# Patient Record
Sex: Female | Born: 1935 | Race: White | Hispanic: No | Smoking: Never smoker
Health system: Southern US, Community
[De-identification: ages and names within clinical notes are randomized; demographics above are authoritative.]

## PROBLEM LIST (undated history)

## (undated) DIAGNOSIS — B019 Varicella without complication: Secondary | ICD-10-CM

## (undated) DIAGNOSIS — M810 Age-related osteoporosis without current pathological fracture: Secondary | ICD-10-CM

## (undated) DIAGNOSIS — M1990 Unspecified osteoarthritis, unspecified site: Secondary | ICD-10-CM

## (undated) DIAGNOSIS — R42 Dizziness and giddiness: Secondary | ICD-10-CM

## (undated) HISTORY — PX: WISDOM TOOTH EXTRACTION: SHX21

## (undated) HISTORY — DX: Varicella without complication: B01.9

## (undated) HISTORY — PX: ABDOMINAL HYSTERECTOMY: SHX81

---

## 2009-06-26 ENCOUNTER — Ambulatory Visit: Payer: Self-pay | Admitting: Internal Medicine

## 2011-01-16 ENCOUNTER — Ambulatory Visit: Payer: Self-pay | Admitting: Internal Medicine

## 2012-01-19 ENCOUNTER — Ambulatory Visit: Payer: Self-pay | Admitting: Internal Medicine

## 2012-02-23 ENCOUNTER — Emergency Department: Payer: Self-pay | Admitting: Emergency Medicine

## 2012-06-29 ENCOUNTER — Ambulatory Visit: Payer: Self-pay | Admitting: Internal Medicine

## 2013-01-19 ENCOUNTER — Ambulatory Visit: Payer: Self-pay | Admitting: Internal Medicine

## 2014-01-22 ENCOUNTER — Ambulatory Visit: Payer: Self-pay | Admitting: Internal Medicine

## 2015-07-10 ENCOUNTER — Other Ambulatory Visit: Payer: Self-pay | Admitting: Internal Medicine

## 2015-07-10 DIAGNOSIS — M810 Age-related osteoporosis without current pathological fracture: Secondary | ICD-10-CM

## 2015-07-24 ENCOUNTER — Ambulatory Visit
Admission: RE | Admit: 2015-07-24 | Discharge: 2015-07-24 | Disposition: A | Discharge status: Home / Self Care | Payer: Medicare HMO | Source: Ambulatory Visit | Attending: Internal Medicine | Admitting: Internal Medicine

## 2015-07-24 DIAGNOSIS — M85832 Other specified disorders of bone density and structure, left forearm: Secondary | ICD-10-CM | POA: Diagnosis not present

## 2015-07-24 DIAGNOSIS — M85851 Other specified disorders of bone density and structure, right thigh: Secondary | ICD-10-CM | POA: Insufficient documentation

## 2015-07-24 DIAGNOSIS — M8588 Other specified disorders of bone density and structure, other site: Secondary | ICD-10-CM | POA: Insufficient documentation

## 2015-07-24 DIAGNOSIS — M810 Age-related osteoporosis without current pathological fracture: Secondary | ICD-10-CM | POA: Diagnosis present

## 2016-06-12 ENCOUNTER — Encounter: Payer: Self-pay | Admitting: *Deleted

## 2016-06-12 NOTE — Discharge Instructions (Signed)
Cataract Surgery, Care After °Refer to this sheet in the next few weeks. These instructions provide you with information about caring for yourself after your procedure. Your health care provider may also give you more specific instructions. Your treatment has been planned according to current medical practices, but problems sometimes occur. Call your health care provider if you have any problems or questions after your procedure. °What can I expect after the procedure? °After the procedure, it is common to have: °· Itching. °· Discomfort. °· Fluid discharge. °· Sensitivity to light and to touch. °· Bruising. °Follow these instructions at home: °Eye Care  °· Check your eye every day for signs of infection. Watch for: °¨ Redness, swelling, or pain. °¨ Fluid, blood, or pus. °¨ Warmth. °¨ Bad smell. °Activity  °· Avoid strenuous activities, such as playing contact sports, for as long as told by your health care provider. °· Do not drive or operate heavy machinery until your health care provider approves. °· Do not bend or lift heavy objects . Bending increases pressure in the eye. You can walk, climb stairs, and do light household chores. °· Ask your health care provider when you can return to work. If you work in a dusty environment, you may be advised to wear protective eyewear for a period of time. °General instructions  °· Take or apply over-the-counter and prescription medicines only as told by your health care provider. This includes eye drops. °· Do not touch or rub your eyes. °· If you were given a protective shield, wear it as told by your health care provider. If you were not given a protective shield, wear sunglasses as told by your health care provider to protect your eyes. °· Keep the area around your eye clean and dry. Avoid swimming or allowing water to hit you directly in the face while showering until told by your health care provider. Keep soap and shampoo out of your eyes. °· Do not put a contact lens  into the affected eye or eyes until your health care provider approves. °· Keep all follow-up visits as told by your health care provider. This is important. °Contact a health care provider if: ° °· You have increased bruising around your eye. °· You have pain that is not helped with medicine. °· You have a fever. °· You have redness, swelling, or pain in your eye. °· You have fluid, blood, or pus coming from your incision. °· Your vision gets worse. °Get help right away if: °· You have sudden vision loss. °This information is not intended to replace advice given to you by your health care provider. Make sure you discuss any questions you have with your health care provider. °Document Released: 11/07/2004 Document Revised: 08/29/2015 Document Reviewed: 02/28/2015 °Elsevier Interactive Patient Education © 2017 [employer] ° ° ° ° °General Anesthesia, Adult, Care After °These instructions provide you with information about caring for yourself after your procedure. Your health care provider may also give you more specific instructions. Your treatment has been planned according to current medical practices, but problems sometimes occur. Call your health care provider if you have any problems or questions after your procedure. °What can I expect after the procedure? °After the procedure, it is common to have: °· Vomiting. °· A sore throat. °· Mental slowness. °It is common to feel: °· Nauseous. °· Cold or shivery. °· Sleepy. °· Tired. °· Sore or achy, even in parts of your body where you did not have surgery. °Follow these instructions at   home: °For at least 24 hours after the procedure:  °· Do not: °¨ Participate in activities where you could fall or become injured. °¨ Drive. °¨ Use heavy machinery. °¨ Drink alcohol. °¨ Take sleeping pills or medicines that cause drowsiness. °¨ Make important decisions or sign legal documents. °¨ Take care of children on your own. °· Rest. °Eating and drinking  °· If you vomit, drink  water, juice, or soup when you can drink without vomiting. °· Drink enough fluid to keep your urine clear or pale yellow. °· Make sure you have little or no nausea before eating solid foods. °· Follow the diet recommended by your health care provider. °General instructions  °· Have a responsible adult stay with you until you are awake and alert. °· Return to your normal activities as told by your health care provider. Ask your health care provider what activities are safe for you. °· Take over-the-counter and prescription medicines only as told by your health care provider. °· If you smoke, do not smoke without supervision. °· Keep all follow-up visits as told by your health care provider. This is important. °Contact a health care provider if: °· You continue to have nausea or vomiting at home, and medicines are not helpful. °· You cannot drink fluids or start eating again. °· You cannot urinate after 8-12 hours. °· You develop a skin rash. °· You have fever. °· You have increasing redness at the site of your procedure. °Get help right away if: °· You have difficulty breathing. °· You have chest pain. °· You have unexpected bleeding. °· You feel that you are having a life-threatening or urgent problem. °This information is not intended to replace advice given to you by your health care provider. Make sure you discuss any questions you have with your health care provider. °Document Released: 07/27/2000 Document Revised: 09/23/2015 Document Reviewed: 04/04/2015 °Elsevier Interactive Patient Education © 2017 [employer] ° °

## 2016-06-15 ENCOUNTER — Encounter: Payer: Self-pay | Admitting: Anesthesiology

## 2016-06-17 ENCOUNTER — Ambulatory Visit: Payer: Medicare PPO | Admitting: Anesthesiology

## 2016-06-17 ENCOUNTER — Encounter
Admission: RE | Disposition: A | Discharge status: Home / Self Care | Payer: Self-pay | Source: Ambulatory Visit | Attending: Ophthalmology

## 2016-06-17 ENCOUNTER — Ambulatory Visit
Admission: RE | Admit: 2016-06-17 | Discharge: 2016-06-17 | Disposition: A | Discharge status: Home / Self Care | Payer: Medicare PPO | Source: Ambulatory Visit | Attending: Ophthalmology | Admitting: Ophthalmology

## 2016-06-17 DIAGNOSIS — H2511 Age-related nuclear cataract, right eye: Secondary | ICD-10-CM | POA: Insufficient documentation

## 2016-06-17 DIAGNOSIS — M810 Age-related osteoporosis without current pathological fracture: Secondary | ICD-10-CM | POA: Diagnosis not present

## 2016-06-17 DIAGNOSIS — Z79899 Other long term (current) drug therapy: Secondary | ICD-10-CM | POA: Diagnosis not present

## 2016-06-17 DIAGNOSIS — Z7982 Long term (current) use of aspirin: Secondary | ICD-10-CM | POA: Insufficient documentation

## 2016-06-17 DIAGNOSIS — Z7983 Long term (current) use of bisphosphonates: Secondary | ICD-10-CM | POA: Diagnosis not present

## 2016-06-17 DIAGNOSIS — Z791 Long term (current) use of non-steroidal anti-inflammatories (NSAID): Secondary | ICD-10-CM | POA: Diagnosis not present

## 2016-06-17 HISTORY — DX: Dizziness and giddiness: R42

## 2016-06-17 HISTORY — PX: CATARACT EXTRACTION W/PHACO: SHX586

## 2016-06-17 HISTORY — DX: Age-related osteoporosis without current pathological fracture: M81.0

## 2016-06-17 HISTORY — DX: Unspecified osteoarthritis, unspecified site: M19.90

## 2016-06-17 SURGERY — PHACOEMULSIFICATION, CATARACT, WITH IOL INSERTION
Anesthesia: Monitor Anesthesia Care | Site: Eye | Laterality: Right | Wound class: Clean

## 2016-06-17 MED ORDER — ACETAMINOPHEN 325 MG PO TABS: 325.0000 mg | ORAL_TABLET | ORAL | Status: DC | PRN

## 2016-06-17 MED ORDER — ARMC OPHTHALMIC DILATING DROPS
1.0000 "application " | OPHTHALMIC | Status: DC | PRN
  Administered 2016-06-17 (×3): 1 via OPHTHALMIC

## 2016-06-17 MED ORDER — LIDOCAINE HCL (PF) 2 % IJ SOLN
INTRAOCULAR | Status: DC | PRN
  Administered 2016-06-17: 2 mL via INTRAOCULAR

## 2016-06-17 MED ORDER — LACTATED RINGERS IV SOLN: INTRAVENOUS | Status: DC

## 2016-06-17 MED ORDER — NA HYALUR & NA CHOND-NA HYALUR 0.4-0.35 ML IO KIT
PACK | INTRAOCULAR | Status: DC | PRN
  Administered 2016-06-17: 1 mL via INTRAOCULAR

## 2016-06-17 MED ORDER — LACTATED RINGERS IV SOLN: 500.0000 mL | INTRAVENOUS | Status: DC

## 2016-06-17 MED ORDER — MIDAZOLAM HCL 2 MG/2ML IJ SOLN
INTRAMUSCULAR | Status: DC | PRN
  Administered 2016-06-17: 1 mg via INTRAVENOUS

## 2016-06-17 MED ORDER — BRIMONIDINE TARTRATE-TIMOLOL 0.2-0.5 % OP SOLN
OPHTHALMIC | Status: DC | PRN
  Administered 2016-06-17: 1 [drp] via OPHTHALMIC

## 2016-06-17 MED ORDER — FENTANYL CITRATE (PF) 100 MCG/2ML IJ SOLN
INTRAMUSCULAR | Status: DC | PRN
Start: 2016-06-17 — End: 2016-06-17
  Administered 2016-06-17: 50 ug via INTRAVENOUS

## 2016-06-17 MED ORDER — MOXIFLOXACIN HCL 0.5 % OP SOLN
1.0000 [drp] | OPHTHALMIC | Status: DC | PRN
  Administered 2016-06-17 (×3): 1 [drp] via OPHTHALMIC

## 2016-06-17 MED ORDER — BSS IO SOLN
INTRAOCULAR | Status: DC | PRN
  Administered 2016-06-17: 63 mL via OPHTHALMIC

## 2016-06-17 MED ORDER — CEFUROXIME OPHTHALMIC INJECTION 1 MG/0.1 ML
INJECTION | OPHTHALMIC | Status: DC | PRN
  Administered 2016-06-17: .3 mL via OPHTHALMIC

## 2016-06-17 MED ORDER — ACETAMINOPHEN 160 MG/5ML PO SOLN: 325.0000 mg | ORAL | Status: DC | PRN

## 2016-06-17 SURGICAL SUPPLY — 25 items
CANNULA ANT/CHMB 27GA (MISCELLANEOUS) ×3 IMPLANT
CARTRIDGE ABBOTT (MISCELLANEOUS) IMPLANT
GLOVE SURG LX 7.5 STRW (GLOVE) ×2
GLOVE SURG LX STRL 7.5 STRW (GLOVE) ×1 IMPLANT
GLOVE SURG TRIUMPH 8.0 PF LTX (GLOVE) ×3 IMPLANT
GOWN STRL REUS W/ TWL LRG LVL3 (GOWN DISPOSABLE) ×2 IMPLANT
GOWN STRL REUS W/TWL LRG LVL3 (GOWN DISPOSABLE) ×4
LENS IOL TECNIS ITEC 24.0 (Intraocular Lens) ×3 IMPLANT
MARKER SKIN DUAL TIP RULER LAB (MISCELLANEOUS) ×3 IMPLANT
NDL RETROBULBAR .5 NSTRL (NEEDLE) IMPLANT
NEEDLE FILTER BLUNT 18X 1/2SAF (NEEDLE) ×2
NEEDLE FILTER BLUNT 18X1 1/2 (NEEDLE) ×1 IMPLANT
PACK CATARACT BRASINGTON (MISCELLANEOUS) ×3 IMPLANT
PACK EYE AFTER SURG (MISCELLANEOUS) ×3 IMPLANT
PACK OPTHALMIC (MISCELLANEOUS) ×3 IMPLANT
RING MALYGIN 7.0 (MISCELLANEOUS) IMPLANT
SUT ETHILON 10-0 CS-B-6CS-B-6 (SUTURE)
SUT VICRYL  9 0 (SUTURE)
SUT VICRYL 9 0 (SUTURE) IMPLANT
SUTURE EHLN 10-0 CS-B-6CS-B-6 (SUTURE) IMPLANT
SYR 3ML LL SCALE MARK (SYRINGE) ×3 IMPLANT
SYR 5ML LL (SYRINGE) ×3 IMPLANT
SYR TB 1ML LUER SLIP (SYRINGE) ×3 IMPLANT
WATER STERILE IRR 250ML POUR (IV SOLUTION) ×3 IMPLANT
WIPE NON LINTING 3.25X3.25 (MISCELLANEOUS) ×3 IMPLANT

## 2016-06-17 NOTE — Anesthesia Preprocedure Evaluation (Signed)
Anesthesia Evaluation  Patient identified by MRN, date of birth, ID band Patient awake    Reviewed: Allergy & Precautions, H&P , NPO status , Patient's Chart, lab work & pertinent test results, reviewed documented beta blocker date and time   Airway Mallampati: II  TM Distance: >3 FB Neck ROM: full    Dental no notable dental hx.    Pulmonary neg pulmonary ROS,    Pulmonary exam normal breath sounds clear to auscultation       Cardiovascular Exercise Tolerance: Good negative cardio ROS   Rhythm:regular Rate:Normal     Neuro/Psych negative neurological ROS  negative psych ROS   GI/Hepatic negative GI ROS, Neg liver ROS,   Endo/Other  negative endocrine ROS  Renal/GU negative Renal ROS  negative genitourinary   Musculoskeletal   Abdominal   Peds  Hematology negative hematology ROS (+)   Anesthesia Other Findings   Reproductive/Obstetrics negative OB ROS                             Anesthesia Physical Anesthesia Plan  ASA: II  Anesthesia Plan: MAC   Post-op Pain Management:    Induction:   Airway Management Planned:   Additional Equipment:   Intra-op Plan:   Post-operative Plan:   Informed Consent:   Plan Discussed with: CRNA  Anesthesia Plan Comments:         Anesthesia Quick Evaluation

## 2016-06-17 NOTE — Op Note (Signed)
LOCATION:  [facility]   PREOPERATIVE DIAGNOSIS:    Nuclear sclerotic cataract right eye. H25.11   POSTOPERATIVE DIAGNOSIS:  Nuclear sclerotic cataract right eye.     PROCEDURE:  Phacoemusification with posterior chamber intraocular lens placement of the right eye   LENS:   Implant Name Type Inv. Item Serial No. Manufacturer Lot No. LRB No. Used  LENS IOL DIOP 24.0 - [id number] Intraocular Lens LENS IOL DIOP 24.0 [id number]   Right 1        ULTRASOUND TIME: 16 % of 1 minutes, 6 seconds.  CDE 10.8   SURGEON:  Deirdre Evenerhadwick R. Marvel Mcphillips, MD   ANESTHESIA:  Topical with tetracaine drops and 2% Xylocaine jelly, augmented with 1% preservative-free intracameral lidocaine.    COMPLICATIONS:  None.   DESCRIPTION OF PROCEDURE:  The patient was identified in the holding room and transported to the operating room and placed in the supine position under the operating microscope.  The right eye was identified as the operative eye and it was prepped and draped in the usual sterile ophthalmic fashion.   A 1 millimeter clear-corneal paracentesis was made at the 12:00 position.  0.5 ml of preservative-free 1% lidocaine was injected into the anterior chamber. The anterior chamber was filled with Viscoat viscoelastic.  A 2.4 millimeter keratome was used to make a near-clear corneal incision at the 9:00 position.  A curvilinear capsulorrhexis was made with a cystotome and capsulorrhexis forceps.  Balanced salt solution was used to hydrodissect and hydrodelineate the nucleus.   Phacoemulsification was then used in stop and chop fashion to remove the lens nucleus and epinucleus.  The remaining cortex was then removed using the irrigation and aspiration handpiece. Provisc was then placed into the capsular bag to distend it for lens placement.  A lens was then injected into the capsular bag.  The remaining viscoelastic was aspirated.   Wounds were hydrated with balanced salt solution.  The anterior  chamber was inflated to a physiologic pressure with balanced salt solution.  No wound leaks were noted. Cefuroxime 0.1 ml of a 10mg /ml solution was injected into the anterior chamber for a dose of 1 mg of intracameral antibiotic at the completion of the case.   Timolol and Brimonidine drops were applied to the eye.  The patient was taken to the recovery room in stable condition without complications of anesthesia or surgery.   Rosalene Wardrop 06/17/2016, 11:21 AM

## 2016-06-17 NOTE — H&P (Signed)
The History and Physical notes are on paper, have been signed, and are to be scanned. The patient remains stable and unchanged from the H&P.   Previous H&P reviewed, patient examined, and there are no changes.  Dawn Clark 06/17/2016 9:58 AM

## 2016-06-17 NOTE — Anesthesia Procedure Notes (Signed)
Procedure Name: MAC Performed by: Omarion Minnehan Pre-anesthesia Checklist: Patient identified, Emergency Drugs available, Suction available, Timeout performed and Patient being monitored Patient Re-evaluated:Patient Re-evaluated prior to inductionOxygen Delivery Method: Nasal cannula Placement Confirmation: positive ETCO2     

## 2016-06-17 NOTE — Transfer of Care (Signed)
Immediate Anesthesia Transfer of Care Note  Patient: Dawn Clark  Procedure(s) Performed: Procedure(s) with comments: CATARACT EXTRACTION PHACO AND INTRAOCULAR LENS PLACEMENT (IOC)  Right Complicated (Right) - possible malyugin right eye  Patient Location: PACU  Anesthesia Type: MAC  Level of Consciousness: awake, alert  and patient cooperative  Airway and Oxygen Therapy: Patient Spontanous Breathing and Patient connected to supplemental oxygen  Post-op Assessment: Post-op Vital signs reviewed, Patient's Cardiovascular Status Stable, Respiratory Function Stable, Patent Airway and No signs of Nausea or vomiting  Post-op Vital Signs: Reviewed and stable  Complications: No apparent anesthesia complications

## 2016-06-17 NOTE — Anesthesia Postprocedure Evaluation (Signed)
Anesthesia Post Note  Patient: Dawn Clark  Procedure(s) Performed: Procedure(s) (LRB): CATARACT EXTRACTION PHACO AND INTRAOCULAR LENS PLACEMENT (IOC)  Right Complicated (Right)  Patient location during evaluation: PACU Anesthesia Type: MAC Level of consciousness: awake and alert Pain management: pain level controlled Vital Signs Assessment: post-procedure vital signs reviewed and stable Respiratory status: spontaneous breathing, nonlabored ventilation and respiratory function stable Cardiovascular status: stable and blood pressure returned to baseline Anesthetic complications: no    [name]

## 2016-06-18 ENCOUNTER — Encounter: Payer: Self-pay | Admitting: Ophthalmology

## 2016-07-10 ENCOUNTER — Encounter: Payer: Self-pay | Admitting: *Deleted

## 2016-07-10 NOTE — Discharge Instructions (Signed)
Cataract Surgery, Care After °Refer to this sheet in the next few weeks. These instructions provide you with information about caring for yourself after your procedure. Your health care provider may also give you more specific instructions. Your treatment has been planned according to current medical practices, but problems sometimes occur. Call your health care provider if you have any problems or questions after your procedure. °What can I expect after the procedure? °After the procedure, it is common to have: °· Itching. °· Discomfort. °· Fluid discharge. °· Sensitivity to light and to touch. °· Bruising. °Follow these instructions at home: °Eye Care  °· Check your eye every day for signs of infection. Watch for: °¨ Redness, swelling, or pain. °¨ Fluid, blood, or pus. °¨ Warmth. °¨ Bad smell. °Activity  °· Avoid strenuous activities, such as playing contact sports, for as long as told by your health care provider. °· Do not drive or operate heavy machinery until your health care provider approves. °· Do not bend or lift heavy objects . Bending increases pressure in the eye. You can walk, climb stairs, and do light household chores. °· Ask your health care provider when you can return to work. If you work in a dusty environment, you may be advised to wear protective eyewear for a period of time. °General instructions  °· Take or apply over-the-counter and prescription medicines only as told by your health care provider. This includes eye drops. °· Do not touch or rub your eyes. °· If you were given a protective shield, wear it as told by your health care provider. If you were not given a protective shield, wear sunglasses as told by your health care provider to protect your eyes. °· Keep the area around your eye clean and dry. Avoid swimming or allowing water to hit you directly in the face while showering until told by your health care provider. Keep soap and shampoo out of your eyes. °· Do not put a contact lens  into the affected eye or eyes until your health care provider approves. °· Keep all follow-up visits as told by your health care provider. This is important. °Contact a health care provider if: ° °· You have increased bruising around your eye. °· You have pain that is not helped with medicine. °· You have a fever. °· You have redness, swelling, or pain in your eye. °· You have fluid, blood, or pus coming from your incision. °· Your vision gets worse. °Get help right away if: °· You have sudden vision loss. °This information is not intended to replace advice given to you by your health care provider. Make sure you discuss any questions you have with your health care provider. °Document Released: 11/07/2004 Document Revised: 08/29/2015 Document Reviewed: 02/28/2015 °Elsevier Interactive Patient Education © 2017 [employer] ° ° ° ° °General Anesthesia, Adult, Care After °These instructions provide you with information about caring for yourself after your procedure. Your health care provider may also give you more specific instructions. Your treatment has been planned according to current medical practices, but problems sometimes occur. Call your health care provider if you have any problems or questions after your procedure. °What can I expect after the procedure? °After the procedure, it is common to have: °· Vomiting. °· A sore throat. °· Mental slowness. °It is common to feel: °· Nauseous. °· Cold or shivery. °· Sleepy. °· Tired. °· Sore or achy, even in parts of your body where you did not have surgery. °Follow these instructions at   home: °For at least 24 hours after the procedure:  °· Do not: °¨ Participate in activities where you could fall or become injured. °¨ Drive. °¨ Use heavy machinery. °¨ Drink alcohol. °¨ Take sleeping pills or medicines that cause drowsiness. °¨ Make important decisions or sign legal documents. °¨ Take care of children on your own. °· Rest. °Eating and drinking  °· If you vomit, drink  water, juice, or soup when you can drink without vomiting. °· Drink enough fluid to keep your urine clear or pale yellow. °· Make sure you have little or no nausea before eating solid foods. °· Follow the diet recommended by your health care provider. °General instructions  °· Have a responsible adult stay with you until you are awake and alert. °· Return to your normal activities as told by your health care provider. Ask your health care provider what activities are safe for you. °· Take over-the-counter and prescription medicines only as told by your health care provider. °· If you smoke, do not smoke without supervision. °· Keep all follow-up visits as told by your health care provider. This is important. °Contact a health care provider if: °· You continue to have nausea or vomiting at home, and medicines are not helpful. °· You cannot drink fluids or start eating again. °· You cannot urinate after 8-12 hours. °· You develop a skin rash. °· You have fever. °· You have increasing redness at the site of your procedure. °Get help right away if: °· You have difficulty breathing. °· You have chest pain. °· You have unexpected bleeding. °· You feel that you are having a life-threatening or urgent problem. °This information is not intended to replace advice given to you by your health care provider. Make sure you discuss any questions you have with your health care provider. °Document Released: 07/27/2000 Document Revised: 09/23/2015 Document Reviewed: 04/04/2015 °Elsevier Interactive Patient Education © 2017 [employer] ° °

## 2016-07-15 ENCOUNTER — Ambulatory Visit: Payer: Medicare PPO | Admitting: Anesthesiology

## 2016-07-15 ENCOUNTER — Ambulatory Visit
Admission: RE | Admit: 2016-07-15 | Discharge: 2016-07-15 | Disposition: A | Discharge status: Home / Self Care | Payer: Medicare PPO | Source: Ambulatory Visit | Attending: Ophthalmology | Admitting: Ophthalmology

## 2016-07-15 ENCOUNTER — Encounter
Admission: RE | Disposition: A | Discharge status: Home / Self Care | Payer: Self-pay | Source: Ambulatory Visit | Attending: Ophthalmology

## 2016-07-15 DIAGNOSIS — H2512 Age-related nuclear cataract, left eye: Secondary | ICD-10-CM | POA: Diagnosis not present

## 2016-07-15 HISTORY — PX: CATARACT EXTRACTION W/PHACO: SHX586

## 2016-07-15 SURGERY — PHACOEMULSIFICATION, CATARACT, WITH IOL INSERTION
Anesthesia: Monitor Anesthesia Care | Site: Eye | Laterality: Left | Wound class: Clean

## 2016-07-15 MED ORDER — NA HYALUR & NA CHOND-NA HYALUR 0.4-0.35 ML IO KIT
PACK | INTRAOCULAR | Status: DC | PRN
  Administered 2016-07-15: 1 mL via INTRAOCULAR

## 2016-07-15 MED ORDER — ACETAMINOPHEN 325 MG PO TABS: 325.0000 mg | ORAL_TABLET | ORAL | Status: DC | PRN

## 2016-07-15 MED ORDER — MOXIFLOXACIN HCL 0.5 % OP SOLN
1.0000 [drp] | OPHTHALMIC | Status: DC | PRN
  Administered 2016-07-15 (×3): 1 [drp] via OPHTHALMIC

## 2016-07-15 MED ORDER — BRIMONIDINE TARTRATE-TIMOLOL 0.2-0.5 % OP SOLN
OPHTHALMIC | Status: DC | PRN
  Administered 2016-07-15: 1 [drp] via OPHTHALMIC

## 2016-07-15 MED ORDER — ARMC OPHTHALMIC DILATING DROPS
1.0000 "application " | OPHTHALMIC | Status: DC | PRN
  Administered 2016-07-15 (×3): 1 via OPHTHALMIC

## 2016-07-15 MED ORDER — LACTATED RINGERS IV SOLN: INTRAVENOUS | Status: DC

## 2016-07-15 MED ORDER — CEFUROXIME OPHTHALMIC INJECTION 1 MG/0.1 ML
INJECTION | OPHTHALMIC | Status: DC | PRN
  Administered 2016-07-15: .3 mL via OPHTHALMIC

## 2016-07-15 MED ORDER — MIDAZOLAM HCL 2 MG/2ML IJ SOLN
INTRAMUSCULAR | Status: DC | PRN
  Administered 2016-07-15: 1.5 mg via INTRAVENOUS

## 2016-07-15 MED ORDER — ACETAMINOPHEN 160 MG/5ML PO SOLN: 325.0000 mg | ORAL | Status: DC | PRN

## 2016-07-15 MED ORDER — EPINEPHRINE PF 1 MG/ML IJ SOLN
INTRAOCULAR | Status: DC | PRN
  Administered 2016-07-15: 69 mL via OPHTHALMIC

## 2016-07-15 MED ORDER — LIDOCAINE HCL (PF) 2 % IJ SOLN
INTRAOCULAR | Status: DC | PRN
  Administered 2016-07-15: 1 mL via INTRAOCULAR

## 2016-07-15 MED ORDER — FENTANYL CITRATE (PF) 100 MCG/2ML IJ SOLN
INTRAMUSCULAR | Status: DC | PRN
  Administered 2016-07-15: 50 ug via INTRAVENOUS

## 2016-07-15 SURGICAL SUPPLY — 25 items
CANNULA ANT/CHMB 27GA (MISCELLANEOUS) ×2 IMPLANT
CARTRIDGE ABBOTT (MISCELLANEOUS) IMPLANT
GLOVE SURG LX 7.5 STRW (GLOVE) ×1
GLOVE SURG LX STRL 7.5 STRW (GLOVE) ×1 IMPLANT
GLOVE SURG TRIUMPH 8.0 PF LTX (GLOVE) ×2 IMPLANT
GOWN STRL REUS W/ TWL LRG LVL3 (GOWN DISPOSABLE) ×2 IMPLANT
GOWN STRL REUS W/TWL LRG LVL3 (GOWN DISPOSABLE) ×2
LENS IOL TECNIS ITEC 23.5 (Intraocular Lens) ×2 IMPLANT
MARKER SKIN DUAL TIP RULER LAB (MISCELLANEOUS) ×2 IMPLANT
NDL RETROBULBAR .5 NSTRL (NEEDLE) IMPLANT
NEEDLE FILTER BLUNT 18X 1/2SAF (NEEDLE) ×1
NEEDLE FILTER BLUNT 18X1 1/2 (NEEDLE) ×1 IMPLANT
PACK CATARACT BRASINGTON (MISCELLANEOUS) ×2 IMPLANT
PACK EYE AFTER SURG (MISCELLANEOUS) ×2 IMPLANT
PACK OPTHALMIC (MISCELLANEOUS) ×2 IMPLANT
RING MALYGIN 7.0 (MISCELLANEOUS) IMPLANT
SUT ETHILON 10-0 CS-B-6CS-B-6 (SUTURE)
SUT VICRYL  9 0 (SUTURE)
SUT VICRYL 9 0 (SUTURE) IMPLANT
SUTURE EHLN 10-0 CS-B-6CS-B-6 (SUTURE) IMPLANT
SYR 3ML LL SCALE MARK (SYRINGE) ×2 IMPLANT
SYR 5ML LL (SYRINGE) ×2 IMPLANT
SYR TB 1ML LUER SLIP (SYRINGE) ×2 IMPLANT
WATER STERILE IRR 250ML POUR (IV SOLUTION) ×2 IMPLANT
WIPE NON LINTING 3.25X3.25 (MISCELLANEOUS) ×2 IMPLANT

## 2016-07-15 NOTE — H&P (Signed)
The History and Physical notes are on paper, have been signed, and are to be scanned. The patient remains stable and unchanged from the H&P.   Previous H&P reviewed, patient examined, and there are no changes.  Dawn Clark 07/15/2016 9:42 AM   

## 2016-07-15 NOTE — Op Note (Signed)
OPERATIVE NOTE  Dawn PeoplesRuby Lea Clark [id number] 07/15/2016   PREOPERATIVE DIAGNOSIS:  Nuclear sclerotic cataract left eye. H25.12   POSTOPERATIVE DIAGNOSIS:    Nuclear sclerotic cataract left eye.     PROCEDURE:  Phacoemusification with posterior chamber intraocular lens placement of the left eye   LENS:   Implant Name Type Inv. Item Serial No. Manufacturer Lot No. LRB No. Used  LENS IOL DIOP 23.5 - [id number] Intraocular Lens LENS IOL DIOP 23.5 [id number]   Left 1        ULTRASOUND TIME: 15  % of 1 minutes 4 seconds, CDE 9.4  SURGEON:  Deirdre Evenerhadwick R. Caly Pellum, MD   ANESTHESIA:  Topical with tetracaine drops and 2% Xylocaine jelly, augmented with 1% preservative-free intracameral lidocaine.    COMPLICATIONS:  None.   DESCRIPTION OF PROCEDURE:  The patient was identified in the holding room and transported to the operating room and placed in the supine position under the operating microscope.  The left eye was identified as the operative eye and it was prepped and draped in the usual sterile ophthalmic fashion.   A 1 millimeter clear-corneal paracentesis was made at the 1:30 position.  0.5 ml of preservative-free 1% lidocaine was injected into the anterior chamber.  The anterior chamber was filled with Viscoat viscoelastic.  A 2.4 millimeter keratome was used to make a near-clear corneal incision at the 10:30 position.  .  A curvilinear capsulorrhexis was made with a cystotome and capsulorrhexis forceps.  Balanced salt solution was used to hydrodissect and hydrodelineate the nucleus.   Phacoemulsification was then used in stop and chop fashion to remove the lens nucleus and epinucleus.  The remaining cortex was then removed using the irrigation and aspiration handpiece. Provisc was then placed into the capsular bag to distend it for lens placement.  A lens was then injected into the capsular bag.  The remaining viscoelastic was aspirated.   Wounds were hydrated with balanced salt  solution.  The anterior chamber was inflated to a physiologic pressure with balanced salt solution.  No wound leaks were noted. Cefuroxime 0.1 ml of a 10mg /ml solution was injected into the anterior chamber for a dose of 1 mg of intracameral antibiotic at the completion of the case.   Timolol and Brimonidine drops were applied to the eye.  The patient was taken to the recovery room in stable condition without complications of anesthesia or surgery.  [name] 07/15/2016, 10:09 AM

## 2016-07-15 NOTE — Transfer of Care (Signed)
Immediate Anesthesia Transfer of Care Note  Patient: Dawn Clark  Procedure(s) Performed: Procedure(s) with comments: CATARACT EXTRACTION PHACO AND INTRAOCULAR LENS PLACEMENT (IOC) (Left) - left  Patient Location: PACU  Anesthesia Type: MAC  Level of Consciousness: awake, alert  and patient cooperative  Airway and Oxygen Therapy: Patient Spontanous Breathing and Patient connected to supplemental oxygen  Post-op Assessment: Post-op Vital signs reviewed, Patient's Cardiovascular Status Stable, Respiratory Function Stable, Patent Airway and No signs of Nausea or vomiting  Post-op Vital Signs: Reviewed and stable  Complications: No apparent anesthesia complications

## 2016-07-15 NOTE — Anesthesia Procedure Notes (Signed)
Procedure Name: MAC Performed by: Mayme Genta Pre-anesthesia Checklist: Patient identified, Emergency Drugs available, Suction available, Timeout performed and Patient being monitored Patient Re-evaluated:Patient Re-evaluated prior to inductionOxygen Delivery Method: Nasal cannula Placement Confirmation: positive ETCO2

## 2016-07-15 NOTE — Anesthesia Preprocedure Evaluation (Signed)
Anesthesia Evaluation  Patient identified by MRN, date of birth, ID band Patient awake    Reviewed: Allergy & Precautions, H&P , NPO status , Patient's Chart, lab work & pertinent test results, reviewed documented beta blocker date and time   Airway Mallampati: II  TM Distance: >3 FB Neck ROM: full    Dental no notable dental hx.    Pulmonary neg pulmonary ROS,    Pulmonary exam normal breath sounds clear to auscultation       Cardiovascular Exercise Tolerance: Good negative cardio ROS   Rhythm:regular Rate:Normal     Neuro/Psych negative neurological ROS  negative psych ROS   GI/Hepatic negative GI ROS, Neg liver ROS,   Endo/Other  negative endocrine ROS  Renal/GU negative Renal ROS  negative genitourinary   Musculoskeletal   Abdominal   Peds  Hematology negative hematology ROS (+)   Anesthesia Other Findings   Reproductive/Obstetrics negative OB ROS                             Anesthesia Physical Anesthesia Plan  ASA: II  Anesthesia Plan: MAC   Post-op Pain Management:    Induction:   Airway Management Planned:   Additional Equipment:   Intra-op Plan:   Post-operative Plan:   Informed Consent:   Plan Discussed with: CRNA  Anesthesia Plan Comments:         Anesthesia Quick Evaluation  

## 2016-07-15 NOTE — Anesthesia Postprocedure Evaluation (Signed)
Anesthesia Post Note  Patient: Dawn Clark  Procedure(s) Performed: Procedure(s) (LRB): CATARACT EXTRACTION PHACO AND INTRAOCULAR LENS PLACEMENT (IOC) (Left)  Patient location during evaluation: PACU Anesthesia Type: MAC Level of consciousness: awake and alert and oriented Pain management: satisfactory to patient Vital Signs Assessment: post-procedure vital signs reviewed and stable Respiratory status: spontaneous breathing, nonlabored ventilation and respiratory function stable Cardiovascular status: blood pressure returned to baseline and stable Postop Assessment: Adequate PO intake and No signs of nausea or vomiting Anesthetic complications: no    Raliegh Ip

## 2016-07-16 ENCOUNTER — Encounter: Payer: Self-pay | Admitting: Ophthalmology

## 2016-10-11 ENCOUNTER — Emergency Department: Payer: Medicare PPO

## 2016-10-11 ENCOUNTER — Emergency Department
Admission: EM | Admit: 2016-10-11 | Discharge: 2016-10-11 | Disposition: A | Discharge status: Home / Self Care | Payer: Medicare PPO | Attending: Emergency Medicine | Admitting: Emergency Medicine

## 2016-10-11 DIAGNOSIS — Y998 Other external cause status: Secondary | ICD-10-CM | POA: Diagnosis not present

## 2016-10-11 DIAGNOSIS — Z7982 Long term (current) use of aspirin: Secondary | ICD-10-CM | POA: Diagnosis not present

## 2016-10-11 DIAGNOSIS — Y9389 Activity, other specified: Secondary | ICD-10-CM | POA: Insufficient documentation

## 2016-10-11 DIAGNOSIS — Y9289 Other specified places as the place of occurrence of the external cause: Secondary | ICD-10-CM | POA: Diagnosis not present

## 2016-10-11 DIAGNOSIS — S0990XA Unspecified injury of head, initial encounter: Secondary | ICD-10-CM | POA: Diagnosis present

## 2016-10-11 DIAGNOSIS — W19XXXA Unspecified fall, initial encounter: Secondary | ICD-10-CM

## 2016-10-11 DIAGNOSIS — Z79899 Other long term (current) drug therapy: Secondary | ICD-10-CM | POA: Insufficient documentation

## 2016-10-11 DIAGNOSIS — W108XXA Fall (on) (from) other stairs and steps, initial encounter: Secondary | ICD-10-CM | POA: Insufficient documentation

## 2016-10-11 DIAGNOSIS — S0101XA Laceration without foreign body of scalp, initial encounter: Secondary | ICD-10-CM | POA: Insufficient documentation

## 2016-10-11 MED ORDER — TETANUS-DIPHTH-ACELL PERTUSSIS 5-2.5-18.5 LF-MCG/0.5 IM SUSP
0.5000 mL | Freq: Once | INTRAMUSCULAR | Status: AC
  Administered 2016-10-11: 0.5 mL via INTRAMUSCULAR
  Filled 2016-10-11: qty 0.5

## 2016-10-11 NOTE — ED Provider Notes (Signed)
[facility] Emergency Department Provider Note  ____________________________________________   First MD Initiated Contact with Patient 10/11/16 1356     (approximate)  I have reviewed the triage vital signs and the nursing notes.   HISTORY  Chief Complaint Fall    HPI Dawn Clark is a 83 y.o. female who comes to the emergency department via EMS after mechanical trip and fall while at church. She was walking down the church steps when she slipped and fell and struck her head. She did not lose consciousness. She has no neck pain. She has no numbness weakness. She denies chest pain shortness of breath abdominal pain nausea or vomiting. She denies palpitations. She takes a baby aspirin every day but no other anticoagulants.   Past Medical History:  Diagnosis Date  . Arthritis   . Osteoporosis   . Vertigo    x1 over 2 yrs    There are no active problems to display for this patient.   Past Surgical History:  Procedure Laterality Date  . ABDOMINAL HYSTERECTOMY    . CATARACT EXTRACTION W/PHACO Right 06/17/2016   Procedure: CATARACT EXTRACTION PHACO AND INTRAOCULAR LENS PLACEMENT (IOC)  Right Complicated;  Surgeon: Lockie Molahadwick Brasington, MD;  [facility];  Service: Ophthalmology;  Laterality: Right;  possible malyugin right eye  . CATARACT EXTRACTION W/PHACO Left 07/15/2016   Procedure: CATARACT EXTRACTION PHACO AND INTRAOCULAR LENS PLACEMENT (IOC);  Surgeon: Lockie Molahadwick Brasington, MD;  [facility];  Service: Ophthalmology;  Laterality: Left;  left    Prior to Admission medications   Medication Sig Start Date End Date Taking? Authorizing Provider  alendronate (FOSAMAX) 70 MG tablet Take 70 mg by mouth once a week. Take with a full glass of water on an empty stomach.    [provider]  ASPIRIN 81 PO Take by mouth daily.    [provider]  calcium carbonate (OSCAL) 1500 (600 Ca) MG TABS tablet Take by  mouth daily.    [provider]  meclizine (ANTIVERT) 25 MG tablet Take 25 mg by mouth daily as needed for dizziness.    [provider]  multivitamin-iron-minerals-folic acid (CENTRUM) chewable tablet Chew 1 tablet by mouth daily.    [provider]  naproxen sodium (ANAPROX) 220 MG tablet Take 220 mg by mouth 2 (two) times daily with a meal.    [provider]    Allergies Patient has no known allergies.  History reviewed. No pertinent family history.  Social History Social History  Substance Use Topics  . Smoking status: Never Smoker  . Smokeless tobacco: Never Used  . Alcohol use No    Review of Systems Constitutional: No fever/chills Eyes: No visual changes. ENT: No sore throat. Cardiovascular: Denies chest pain. Respiratory: Denies shortness of breath. Gastrointestinal: No abdominal pain.  No nausea, no vomiting.  No diarrhea.  No constipation. Genitourinary: Negative for dysuria. Musculoskeletal: Negative for back pain. Skin: Positive for wound Neurological: Positive for headache   ____________________________________________   PHYSICAL EXAM:  VITAL SIGNS: ED Triage Vitals [10/11/16 1354]  Enc Vitals Group     BP (!) 154/97     Pulse Rate 84     Resp 18     Temp 98 F (36.7 C)     Temp Source Oral     SpO2 97 %     Weight 145 lb (65.8 kg)     Height 5\' 2"  (1.575 m)     Head Circumference  Peak Flow      Pain Score      Pain Loc      Pain Edu?      Excl. in GC?     Constitutional: Alert and oriented x 4 well appearing nontoxic no diaphoresis speaks in full, clear sentences Eyes: PERRL EOMI. Head: 2 small lacerations just above her left brow one 1 cm in length one 2 cm in length Nose: No congestion/rhinnorhea. Mouth/Throat: No trismus Neck: No stridor.  No midline tenderness Cardiovascular: Normal rate, regular rhythm. Grossly normal heart sounds.  Good peripheral circulation. Respiratory: Normal respiratory  effort.  No retractions. Lungs CTAB and moving good air Gastrointestinal: Soft nontender Musculoskeletal: No lower extremity edema   Neurologic:  Normal speech and language. No gross focal neurologic deficits are appreciated. Skin:  Skin is warm, dry and intact. No rash noted. Psychiatric: Mood and affect are normal. Speech and behavior are normal.    ____________________________________________   DIFFERENTIAL   ____________________________________________   LABS (all labs ordered are listed, but only abnormal results are displayed)  Labs Reviewed - No data to display   __________________________________________  EKG   ____________________________________________  RADIOLOGY  Head CT with no acute disease ____________________________________________   PROCEDURES  Procedure(s) performed: yes  LACERATION REPAIR Performed by: Merrily Brittle Authorized by: Merrily Brittle Consent: Verbal consent obtained. Risks and benefits: risks, benefits and alternatives were discussed Consent given by: patient Patient identity confirmed: provided demographic data Prepped and Draped in normal sterile fashion Wound explored  Laceration Location: 2 different lacerations one was 1 cm above the left brow the other was 2 cm above the left brow  Laceration Length: 1 cm and 2 cm   No Foreign Bodies seen or palpated    Irrigation method: syringe Amount of cleaning: standard  Skin closure: Dermabond     Technique: Dermabond with high flow nasal cannula to drive   Patient tolerance: Patient tolerated the procedure well with no immediate complications.    Procedures  Critical Care performed: no  Observation: no ____________________________________________   INITIAL IMPRESSION / ASSESSMENT AND PLAN / ED COURSE  Pertinent labs & imaging results that were available during my care of the patient were reviewed by me and considered in my medical decision making (see chart  for details).  The patient arrives neurovascularly intact and very well-appearing. She is NEXUS negative.  She is not on blood thinning medication aside from a daily baby aspirin. Given the significant trauma think is reasonable to obtain a head CT and reevaluate.  Fortunately the patient's head CT is negative for acute pathology. I irrigated her wounds with copious amounts of normal saline and then closed with Dermabond with good cosmesis. Strict return precautions given and she is discharged home in improved condition.     ____________________________________________   FINAL CLINICAL IMPRESSION(S) / ED DIAGNOSES  Final diagnoses:  Fall, initial encounter  Minor head injury, initial encounter  Laceration of scalp, initial encounter      NEW MEDICATIONS STARTED DURING THIS VISIT:  Discharge Medication List as of 10/11/2016  3:11 PM       Note:  This document was prepared using Dragon voice recognition software and may include unintentional dictation errors.     Merrily Brittle, MD 10/12/16 [phone]

## 2016-10-11 NOTE — ED Triage Notes (Signed)
Pt was at church and fell down the last step and hit her head on the pavement.  Pt denies LOC.  Pt states 0/10 pain.

## 2016-10-11 NOTE — Discharge Instructions (Signed)
Please do not wash your face for 24 hours and after 24 hours be very cautious and do not scrub your face. Follow-up with your doctor in 2 days for recheck as needed. Return to the emergency department for any concerns.  It was a pleasure to take care of you today, and thank you for coming to our emergency department.  If you have any questions or concerns before leaving please ask the nurse to grab me and I'm more than happy to go through your aftercare instructions again.  If you were prescribed any opioid pain medication today such as Norco, Vicodin, Percocet, morphine, hydrocodone, or oxycodone please make sure you do not drive when you are taking this medication as it can alter your ability to drive safely.  If you have any concerns once you are home that you are not improving or are in fact getting worse before you can make it to your follow-up appointment, please do not hesitate to call [phone] and come back for further evaluation.  Merrily BrittleNeil Kendale Rembold MD  No results found for this or any previous visit. Ct Head Wo Contrast  Result Date: 10/11/2016 CLINICAL DATA:  Fall.  Head injury. EXAM: CT HEAD WITHOUT CONTRAST TECHNIQUE: Contiguous axial images were obtained from the base of the skull through the vertex without intravenous contrast. COMPARISON:  None. FINDINGS: Brain: No evidence of acute infarction, hemorrhage, hydrocephalus, extra-axial collection or mass lesion/mass effect. Vascular: No hyperdense vessel or unexpected calcification. Skull: Normal. Negative for fracture or focal lesion. Sinuses/Orbits: No acute finding. Other: None. IMPRESSION: No acute intracranial pathology. Electronically Signed   By: Jolaine ClickArthur  Hoss M.D.   On: 10/11/2016 14:18

## 2016-11-30 ENCOUNTER — Ambulatory Visit
Admission: RE | Admit: 2016-11-30 | Discharge: 2016-11-30 | Disposition: A | Discharge status: Home / Self Care | Payer: Medicare PPO | Source: Ambulatory Visit | Attending: Internal Medicine | Admitting: Internal Medicine

## 2016-11-30 ENCOUNTER — Ambulatory Visit: Admission: RE | Admit: 2016-11-30 | Payer: Medicare PPO | Source: Ambulatory Visit | Admitting: *Deleted

## 2016-11-30 ENCOUNTER — Other Ambulatory Visit: Payer: Self-pay | Admitting: Internal Medicine

## 2016-11-30 DIAGNOSIS — W19XXXA Unspecified fall, initial encounter: Secondary | ICD-10-CM

## 2016-11-30 DIAGNOSIS — M25562 Pain in left knee: Secondary | ICD-10-CM | POA: Insufficient documentation

## 2016-11-30 DIAGNOSIS — M1712 Unilateral primary osteoarthritis, left knee: Secondary | ICD-10-CM | POA: Insufficient documentation

## 2017-03-14 DIAGNOSIS — M1712 Unilateral primary osteoarthritis, left knee: Secondary | ICD-10-CM | POA: Insufficient documentation

## 2017-05-26 ENCOUNTER — Encounter
Admission: RE | Admit: 2017-05-26 | Discharge: 2017-05-26 | Disposition: A | Discharge status: Home / Self Care | Payer: Medicare PPO | Source: Ambulatory Visit | Attending: Orthopedic Surgery | Admitting: Orthopedic Surgery

## 2017-05-26 ENCOUNTER — Other Ambulatory Visit: Payer: Self-pay

## 2017-05-26 DIAGNOSIS — Z01812 Encounter for preprocedural laboratory examination: Secondary | ICD-10-CM | POA: Diagnosis not present

## 2017-05-26 DIAGNOSIS — M1990 Unspecified osteoarthritis, unspecified site: Secondary | ICD-10-CM | POA: Insufficient documentation

## 2017-05-26 DIAGNOSIS — Z01810 Encounter for preprocedural cardiovascular examination: Secondary | ICD-10-CM | POA: Insufficient documentation

## 2017-05-26 DIAGNOSIS — M810 Age-related osteoporosis without current pathological fracture: Secondary | ICD-10-CM | POA: Insufficient documentation

## 2017-05-26 DIAGNOSIS — R42 Dizziness and giddiness: Secondary | ICD-10-CM | POA: Diagnosis not present

## 2017-05-26 LAB — TYPE AND SCREEN
ABO/RH(D): O NEG
ANTIBODY SCREEN: NEGATIVE

## 2017-05-26 LAB — COMPREHENSIVE METABOLIC PANEL
ALBUMIN: 4.1 g/dL (ref 3.5–5.0)
ALT: 16 U/L (ref 14–54)
AST: 23 U/L (ref 15–41)
Alkaline Phosphatase: 48 U/L (ref 38–126)
Anion gap: 8 (ref 5–15)
BILIRUBIN TOTAL: 1 mg/dL (ref 0.3–1.2)
BUN: 21 mg/dL — AB (ref 6–20)
CHLORIDE: 101 mmol/L (ref 101–111)
CO2: 31 mmol/L (ref 22–32)
Calcium: 9.5 mg/dL (ref 8.9–10.3)
Creatinine, Ser: 0.95 mg/dL (ref 0.44–1.00)
GFR calc Af Amer: >60 mL/min (ref >60)
GFR, EST NON AFRICAN AMERICAN: 53 mL/min — AB (ref >60)
Glucose, Bld: 87 mg/dL (ref 65–99)
POTASSIUM: 3.5 mmol/L (ref 3.5–5.1)
Sodium: 140 mmol/L (ref 135–145)
TOTAL PROTEIN: 7.4 g/dL (ref 6.5–8.1)

## 2017-05-26 LAB — URINALYSIS, ROUTINE W REFLEX MICROSCOPIC
BILIRUBIN URINE: NEGATIVE
Glucose, UA: NEGATIVE mg/dL
Hgb urine dipstick: NEGATIVE
KETONES UR: 5 mg/dL — AB
NITRITE: NEGATIVE
PROTEIN: 30 mg/dL — AB
RBC / HPF: NONE SEEN RBC/hpf (ref 0–5)
Specific Gravity, Urine: 1.021 (ref 1.005–1.030)
pH: 5 (ref 5.0–8.0)

## 2017-05-26 LAB — PROTIME-INR
INR: 0.9
PROTHROMBIN TIME: 12.1 s (ref 11.4–15.2)

## 2017-05-26 LAB — CBC
HCT: 45.4 % (ref 35.0–47.0)
Hemoglobin: 15.4 g/dL (ref 12.0–16.0)
MCH: 31.6 pg (ref 26.0–34.0)
MCHC: 33.9 g/dL (ref 32.0–36.0)
MCV: 93.2 fL (ref 80.0–100.0)
PLATELETS: 217 10*3/uL (ref 150–440)
RBC: 4.88 MIL/uL (ref 3.80–5.20)
RDW: 12.7 % (ref 11.5–14.5)
WBC: 7.3 10*3/uL (ref 3.6–11.0)

## 2017-05-26 LAB — C-REACTIVE PROTEIN: CRP: <0.8 mg/dL (ref <1.0)

## 2017-05-26 LAB — SEDIMENTATION RATE: SED RATE: 3 mm/h (ref 0–30)

## 2017-05-26 LAB — APTT: APTT: 31 s (ref 24–36)

## 2017-05-26 LAB — SURGICAL PCR SCREEN
MRSA, PCR: NEGATIVE
Staphylococcus aureus: NEGATIVE

## 2017-05-26 NOTE — Patient Instructions (Signed)
Your procedure is scheduled on: 06/07/17 Mon Report to Same Day Surgery 2nd floor medical mall [facility](Medical Mall Entrance-take elevator on left to 2nd floor.  Check in with surgery information desk.) To find out your arrival time please call [phone] between 1PM - 3PM on 06/04/17 Fri  Remember: Instructions that are not followed completely may result in serious medical risk, up to and including death, or upon the discretion of your surgeon and anesthesiologist your surgery may need to be rescheduled.    _x___ 1. Do not eat food after midnight the night before your procedure. You may drink clear liquids up to 2 hours before you are scheduled to arrive at the hospital for your procedure.  Do not drink clear liquids within 2 hours of your scheduled arrival to the hospital.  Clear liquids include  --Water or Apple juice without pulp  --Clear carbohydrate beverage such as ClearFast or Gatorade  --Black Coffee or Clear Tea (No milk, no creamers, do not add anything to                  the coffee or Tea Type 1 and type 2 diabetics should only drink water.  No gum chewing or hard candies.     __x__ 2. No Alcohol for 24 hours before or after surgery.   __x__3. No Smoking for 24 prior to surgery.   ____  4. Bring all medications with you on the day of surgery if instructed.    __x__ 5. Notify your doctor if there is any change in your medical condition     (cold, fever, infections).     Do not wear jewelry, make-up, hairpins, clips or nail polish.  Do not wear lotions, powders, or perfumes. You may wear deodorant.  Do not shave 48 hours prior to surgery. Men may shave face and neck.  Do not bring valuables to the hospital.    [facility] is not responsible for any belongings or valuables.               Contacts, dentures or bridgework may not be worn into surgery.  Leave your suitcase in the car. After surgery it may be brought to your room.  For patients admitted to the hospital, discharge  time is determined by your                       treatment team.   Patients discharged the day of surgery will not be allowed to drive home.  You will need someone to drive you home and stay with you the night of your procedure.    Please read over the following fact sheets that you were given:   [facility] Preparing for Surgery and or MRSA Information   _x___ Take anti-hypertensive listed below, cardiac, seizure, asthma,     anti-reflux and psychiatric medicines. These include:  1. None  2.  3.  4.  5.  6.  ____Fleets enema or Magnesium Citrate as directed.   _x___ Use CHG Soap or sage wipes as directed on instruction sheet   ____ Use inhalers on the day of surgery and bring to hospital day of surgery  ____ Stop Metformin and Janumet 2 days prior to surgery.    ____ Take 1/2 of usual insulin dose the night before surgery and none on the morning     surgery.   _x___ Follow recommendations from Cardiologist, Pulmonologist or PCP regarding  stopping Aspirin, Coumadin, Plavix ,Eliquis, Effient, or Pradaxa, and Pletal.  Stop aspirin according to physician instruction  X____Stop Anti-inflammatories such as Advil, Aleve, Ibuprofen, Motrin, Naproxen, Naprosyn, Goodies powders or aspirin products. OK to take Tylenol and  Celebrex.  Stop Mobic/Meloxicam 1 week before surgery   _x___ Stop supplements until after surgery.  But may continue Vitamin D, Vitamin B,       and multivitamin.   ____ Bring C-Pap to the hospital.

## 2017-05-27 LAB — URINE CULTURE
Culture: NO GROWTH
SPECIAL REQUESTS: NORMAL

## 2017-06-06 ENCOUNTER — Encounter: Payer: Self-pay | Admitting: Orthopedic Surgery

## 2017-06-06 MED ORDER — CEFAZOLIN SODIUM-DEXTROSE 2-4 GM/100ML-% IV SOLN
2.0000 g | INTRAVENOUS | Status: AC
  Administered 2017-06-07: 2 g via INTRAVENOUS

## 2017-06-06 MED ORDER — TRANEXAMIC ACID 1000 MG/10ML IV SOLN
1000.0000 mg | INTRAVENOUS | Status: AC
  Administered 2017-06-07: 1000 mg via INTRAVENOUS
  Filled 2017-06-06: qty 10

## 2017-06-06 NOTE — Discharge Instructions (Signed)
°  Instructions after Total Knee Replacement ° ° Latrece Nitta P. Stefanos Haynesworth, Jr., M.D.    ° Dept. of Orthopaedics & Sports Medicine ° [facility] ° [address] ° , [place]  [address] ° Phone: [phone]   Fax: [phone] ° °  °DIET: °• Drink plenty of non-alcoholic fluids. °• Resume your normal diet. Include foods high in fiber. ° °ACTIVITY:  °• You may use crutches or a walker with weight-bearing as tolerated, unless instructed otherwise. °• You may be weaned off of the walker or crutches by your Physical Therapist.  °• Do NOT place pillows under the knee. Anything placed under the knee could limit your ability to straighten the knee.   °• Continue doing gentle exercises. Exercising will reduce the pain and swelling, increase motion, and prevent muscle weakness.   °• Please continue to use the TED compression stockings for 6 weeks. You may remove the stockings at night, but should reapply them in the morning. °• Do not drive or operate any equipment until instructed. ° °WOUND CARE:  °• Continue to use the PolarCare or ice packs periodically to reduce pain and swelling. °• You may bathe or shower after the staples are removed at the first office visit following surgery. ° °MEDICATIONS: °• You may resume your regular medications. °• Please take the pain medication as prescribed on the medication. °• Do not take pain medication on an empty stomach. °• You have been given a prescription for a blood thinner (Lovenox or Coumadin). Please take the medication as instructed. (NOTE: After completing a 2 week course of Lovenox, take one Enteric-coated aspirin once a day. This along with elevation will help reduce the possibility of phlebitis in your operated leg.) °• Do not drive or drink alcoholic beverages when taking pain medications. ° °CALL THE OFFICE FOR: °• Temperature above 101 degrees °• Excessive bleeding or drainage on the dressing. °• Excessive swelling, coldness, or paleness of the toes. °• Persistent  nausea and vomiting. ° °FOLLOW-UP:  °• You should have an appointment to return to the office in 10-14 days after surgery. °• Arrangements have been made for continuation of Physical Therapy (either home therapy or outpatient therapy). °  °

## 2017-06-07 ENCOUNTER — Inpatient Hospital Stay: Payer: Medicare PPO | Admitting: Certified Registered Nurse Anesthetist

## 2017-06-07 ENCOUNTER — Inpatient Hospital Stay
Admission: RE | Admit: 2017-06-07 | Discharge: 2017-06-09 | DRG: 470 | Disposition: A | Discharge status: Skilled Nursing Facility | Payer: Medicare PPO | Source: Ambulatory Visit | Attending: Orthopedic Surgery | Admitting: Orthopedic Surgery

## 2017-06-07 ENCOUNTER — Inpatient Hospital Stay: Payer: Medicare PPO

## 2017-06-07 ENCOUNTER — Other Ambulatory Visit: Payer: Self-pay

## 2017-06-07 ENCOUNTER — Encounter
Admission: RE | Disposition: A | Discharge status: Skilled Nursing Facility | Payer: Self-pay | Source: Ambulatory Visit | Attending: Orthopedic Surgery

## 2017-06-07 DIAGNOSIS — Z7982 Long term (current) use of aspirin: Secondary | ICD-10-CM

## 2017-06-07 DIAGNOSIS — Z7983 Long term (current) use of bisphosphonates: Secondary | ICD-10-CM

## 2017-06-07 DIAGNOSIS — Z96652 Presence of left artificial knee joint: Secondary | ICD-10-CM

## 2017-06-07 DIAGNOSIS — M810 Age-related osteoporosis without current pathological fracture: Secondary | ICD-10-CM | POA: Diagnosis present

## 2017-06-07 DIAGNOSIS — Z791 Long term (current) use of non-steroidal anti-inflammatories (NSAID): Secondary | ICD-10-CM

## 2017-06-07 DIAGNOSIS — Z8249 Family history of ischemic heart disease and other diseases of the circulatory system: Secondary | ICD-10-CM

## 2017-06-07 DIAGNOSIS — M1712 Unilateral primary osteoarthritis, left knee: Principal | ICD-10-CM | POA: Diagnosis present

## 2017-06-07 DIAGNOSIS — Z96659 Presence of unspecified artificial knee joint: Secondary | ICD-10-CM

## 2017-06-07 DIAGNOSIS — Z833 Family history of diabetes mellitus: Secondary | ICD-10-CM | POA: Diagnosis not present

## 2017-06-07 DIAGNOSIS — M25562 Pain in left knee: Secondary | ICD-10-CM | POA: Diagnosis present

## 2017-06-07 HISTORY — PX: KNEE ARTHROPLASTY: SHX992

## 2017-06-07 LAB — ABO/RH: ABO/RH(D): O NEG

## 2017-06-07 SURGERY — ARTHROPLASTY, KNEE, TOTAL, USING IMAGELESS COMPUTER-ASSISTED NAVIGATION
Anesthesia: Spinal | Site: Knee | Laterality: Left | Wound class: Clean

## 2017-06-07 MED ORDER — TRAMADOL HCL 50 MG PO TABS: 50.0000 mg | ORAL_TABLET | ORAL | Status: DC | PRN

## 2017-06-07 MED ORDER — NEOMYCIN-POLYMYXIN B GU 40-200000 IR SOLN
Status: DC | PRN
  Administered 2017-06-07: 14 mL

## 2017-06-07 MED ORDER — ACETAMINOPHEN 10 MG/ML IV SOLN
INTRAVENOUS | Status: AC
  Filled 2017-06-07: qty 100

## 2017-06-07 MED ORDER — SODIUM CHLORIDE 0.9 % IV SOLN
INTRAVENOUS | Status: DC | PRN
  Administered 2017-06-07: 60 mL

## 2017-06-07 MED ORDER — ALENDRONATE SODIUM 70 MG PO TABS: 70.0000 mg | ORAL_TABLET | ORAL | Status: DC

## 2017-06-07 MED ORDER — CELECOXIB 200 MG PO CAPS
400.0000 mg | ORAL_CAPSULE | Freq: Once | ORAL | Status: AC
  Administered 2017-06-07: 400 mg via ORAL

## 2017-06-07 MED ORDER — FENTANYL CITRATE (PF) 100 MCG/2ML IJ SOLN
INTRAMUSCULAR | Status: DC | PRN
  Administered 2017-06-07: 25 ug via INTRAVENOUS
  Administered 2017-06-07: 75 ug via INTRAVENOUS

## 2017-06-07 MED ORDER — FAMOTIDINE 20 MG PO TABS
ORAL_TABLET | ORAL | Status: AC
  Administered 2017-06-07: 20 mg via ORAL
  Filled 2017-06-07: qty 1

## 2017-06-07 MED ORDER — FENTANYL CITRATE (PF) 100 MCG/2ML IJ SOLN: 25.0000 ug | INTRAMUSCULAR | Status: DC | PRN

## 2017-06-07 MED ORDER — GABAPENTIN 300 MG PO CAPS
ORAL_CAPSULE | ORAL | Status: AC
  Administered 2017-06-07: 300 mg via ORAL
  Filled 2017-06-07: qty 1

## 2017-06-07 MED ORDER — CEFAZOLIN SODIUM-DEXTROSE 2-4 GM/100ML-% IV SOLN
INTRAVENOUS | Status: AC
  Filled 2017-06-07: qty 100

## 2017-06-07 MED ORDER — SENNOSIDES-DOCUSATE SODIUM 8.6-50 MG PO TABS
1.0000 | ORAL_TABLET | Freq: Two times a day (BID) | ORAL | Status: DC
  Administered 2017-06-07 – 2017-06-09 (×4): 1 via ORAL
  Filled 2017-06-07 (×4): qty 1

## 2017-06-07 MED ORDER — ONDANSETRON HCL 4 MG/2ML IJ SOLN: 4.0000 mg | Freq: Once | INTRAMUSCULAR | Status: DC | PRN

## 2017-06-07 MED ORDER — BUPIVACAINE HCL (PF) 0.25 % IJ SOLN
INTRAMUSCULAR | Status: AC
  Filled 2017-06-07: qty 30

## 2017-06-07 MED ORDER — PROPOFOL 10 MG/ML IV BOLUS
INTRAVENOUS | Status: DC | PRN
  Administered 2017-06-07: 13 mg via INTRAVENOUS

## 2017-06-07 MED ORDER — MORPHINE SULFATE (PF) 2 MG/ML IV SOLN: 2.0000 mg | INTRAVENOUS | Status: DC | PRN

## 2017-06-07 MED ORDER — DIPHENHYDRAMINE HCL 12.5 MG/5ML PO ELIX: 12.5000 mg | ORAL_SOLUTION | ORAL | Status: DC | PRN

## 2017-06-07 MED ORDER — OXYCODONE HCL 5 MG PO TABS
5.0000 mg | ORAL_TABLET | ORAL | Status: DC | PRN
  Administered 2017-06-07 – 2017-06-09 (×6): 5 mg via ORAL
  Filled 2017-06-07 (×6): qty 1

## 2017-06-07 MED ORDER — NEOMYCIN-POLYMYXIN B GU 40-200000 IR SOLN
Status: AC
  Filled 2017-06-07: qty 1

## 2017-06-07 MED ORDER — ACETAMINOPHEN 10 MG/ML IV SOLN
INTRAVENOUS | Status: DC | PRN
  Administered 2017-06-07: 1000 mg via INTRAVENOUS

## 2017-06-07 MED ORDER — CELECOXIB 200 MG PO CAPS
ORAL_CAPSULE | ORAL | Status: AC
  Administered 2017-06-07: 400 mg via ORAL
  Filled 2017-06-07: qty 2

## 2017-06-07 MED ORDER — CELECOXIB 200 MG PO CAPS
200.0000 mg | ORAL_CAPSULE | Freq: Two times a day (BID) | ORAL | Status: DC
  Administered 2017-06-07 – 2017-06-09 (×4): 200 mg via ORAL
  Filled 2017-06-07 (×5): qty 1

## 2017-06-07 MED ORDER — ENOXAPARIN SODIUM 30 MG/0.3ML ~~LOC~~ SOLN
30.0000 mg | Freq: Two times a day (BID) | SUBCUTANEOUS | Status: DC
  Administered 2017-06-08 – 2017-06-09 (×3): 30 mg via SUBCUTANEOUS
  Filled 2017-06-07 (×3): qty 0.3

## 2017-06-07 MED ORDER — BUPIVACAINE LIPOSOME 1.3 % IJ SUSP
INTRAMUSCULAR | Status: AC
  Filled 2017-06-07: qty 20

## 2017-06-07 MED ORDER — SODIUM CHLORIDE 0.9 % IJ SOLN
INTRAMUSCULAR | Status: AC
  Filled 2017-06-07: qty 50

## 2017-06-07 MED ORDER — MENTHOL 3 MG MT LOZG
1.0000 | LOZENGE | OROMUCOSAL | Status: DC | PRN
  Filled 2017-06-07: qty 9

## 2017-06-07 MED ORDER — BUPIVACAINE HCL (PF) 0.25 % IJ SOLN
INTRAMUSCULAR | Status: DC | PRN
  Administered 2017-06-07: 60 mL via INTRA_ARTICULAR

## 2017-06-07 MED ORDER — LACTATED RINGERS IV SOLN
INTRAVENOUS | Status: DC
  Administered 2017-06-07: 1000 mL via INTRAVENOUS
  Administered 2017-06-07: 09:00:00 via INTRAVENOUS

## 2017-06-07 MED ORDER — METOCLOPRAMIDE HCL 10 MG PO TABS
10.0000 mg | ORAL_TABLET | Freq: Three times a day (TID) | ORAL | Status: AC
  Administered 2017-06-07 – 2017-06-09 (×8): 10 mg via ORAL
  Filled 2017-06-07 (×8): qty 1

## 2017-06-07 MED ORDER — PROPOFOL 500 MG/50ML IV EMUL
INTRAVENOUS | Status: AC
  Filled 2017-06-07: qty 50

## 2017-06-07 MED ORDER — ONDANSETRON HCL 4 MG PO TABS: 4.0000 mg | ORAL_TABLET | Freq: Four times a day (QID) | ORAL | Status: DC | PRN

## 2017-06-07 MED ORDER — TRANEXAMIC ACID 1000 MG/10ML IV SOLN
1000.0000 mg | Freq: Once | INTRAVENOUS | Status: AC
  Administered 2017-06-07: 1000 mg via INTRAVENOUS
  Filled 2017-06-07: qty 10

## 2017-06-07 MED ORDER — PHENYLEPHRINE HCL 10 MG/ML IJ SOLN
INTRAMUSCULAR | Status: AC
  Filled 2017-06-07: qty 1

## 2017-06-07 MED ORDER — SODIUM CHLORIDE 0.9 % IV SOLN
INTRAVENOUS | Status: DC
  Administered 2017-06-07 – 2017-06-08 (×3): via INTRAVENOUS

## 2017-06-07 MED ORDER — GABAPENTIN 300 MG PO CAPS
300.0000 mg | ORAL_CAPSULE | Freq: Once | ORAL | Status: AC
  Administered 2017-06-07: 300 mg via ORAL

## 2017-06-07 MED ORDER — PROPOFOL 500 MG/50ML IV EMUL
INTRAVENOUS | Status: DC | PRN
  Administered 2017-06-07: 60 ug/kg/min via INTRAVENOUS

## 2017-06-07 MED ORDER — ACETAMINOPHEN 10 MG/ML IV SOLN
1000.0000 mg | Freq: Four times a day (QID) | INTRAVENOUS | Status: AC
  Administered 2017-06-07 – 2017-06-08 (×4): 1000 mg via INTRAVENOUS
  Filled 2017-06-07 (×4): qty 100

## 2017-06-07 MED ORDER — ALUM & MAG HYDROXIDE-SIMETH 200-200-20 MG/5ML PO SUSP: 30.0000 mL | ORAL | Status: DC | PRN

## 2017-06-07 MED ORDER — FERROUS SULFATE 325 (65 FE) MG PO TABS
325.0000 mg | ORAL_TABLET | Freq: Two times a day (BID) | ORAL | Status: DC
  Administered 2017-06-07 – 2017-06-09 (×4): 325 mg via ORAL
  Filled 2017-06-07 (×4): qty 1

## 2017-06-07 MED ORDER — FLEET ENEMA 7-19 GM/118ML RE ENEM: 1.0000 | ENEMA | Freq: Once | RECTAL | Status: DC | PRN

## 2017-06-07 MED ORDER — CHLORHEXIDINE GLUCONATE 4 % EX LIQD: 60.0000 mL | Freq: Once | CUTANEOUS | Status: DC

## 2017-06-07 MED ORDER — FAMOTIDINE 20 MG PO TABS
20.0000 mg | ORAL_TABLET | Freq: Once | ORAL | Status: AC
  Administered 2017-06-07: 20 mg via ORAL

## 2017-06-07 MED ORDER — GABAPENTIN 300 MG PO CAPS
300.0000 mg | ORAL_CAPSULE | Freq: Every day | ORAL | Status: DC
  Administered 2017-06-07 – 2017-06-08 (×2): 300 mg via ORAL
  Filled 2017-06-07 (×2): qty 1

## 2017-06-07 MED ORDER — FENTANYL CITRATE (PF) 100 MCG/2ML IJ SOLN
INTRAMUSCULAR | Status: AC
  Filled 2017-06-07: qty 2

## 2017-06-07 MED ORDER — ADULT MULTIVITAMIN W/MINERALS CH
1.0000 | ORAL_TABLET | Freq: Every day | ORAL | Status: DC
  Administered 2017-06-08 – 2017-06-09 (×2): 1 via ORAL
  Filled 2017-06-07 (×2): qty 1

## 2017-06-07 MED ORDER — DEXAMETHASONE SODIUM PHOSPHATE 10 MG/ML IJ SOLN
INTRAMUSCULAR | Status: AC
  Administered 2017-06-07: 8 mg via INTRAVENOUS
  Filled 2017-06-07: qty 1

## 2017-06-07 MED ORDER — PHENOL 1.4 % MT LIQD
1.0000 | OROMUCOSAL | Status: DC | PRN
  Filled 2017-06-07: qty 177

## 2017-06-07 MED ORDER — OXYCODONE HCL 5 MG PO TABS: 10.0000 mg | ORAL_TABLET | ORAL | Status: DC | PRN

## 2017-06-07 MED ORDER — CEFAZOLIN SODIUM-DEXTROSE 2-4 GM/100ML-% IV SOLN
2.0000 g | Freq: Four times a day (QID) | INTRAVENOUS | Status: AC
  Administered 2017-06-07 – 2017-06-08 (×4): 2 g via INTRAVENOUS
  Filled 2017-06-07 (×4): qty 100

## 2017-06-07 MED ORDER — BISACODYL 10 MG RE SUPP: 10.0000 mg | Freq: Every day | RECTAL | Status: DC | PRN

## 2017-06-07 MED ORDER — ONDANSETRON HCL 4 MG/2ML IJ SOLN: 4.0000 mg | Freq: Four times a day (QID) | INTRAMUSCULAR | Status: DC | PRN

## 2017-06-07 MED ORDER — PANTOPRAZOLE SODIUM 40 MG PO TBEC
40.0000 mg | DELAYED_RELEASE_TABLET | Freq: Two times a day (BID) | ORAL | Status: DC
  Administered 2017-06-07 – 2017-06-09 (×5): 40 mg via ORAL
  Filled 2017-06-07 (×5): qty 1

## 2017-06-07 MED ORDER — SODIUM CHLORIDE 0.9 % IV SOLN
INTRAVENOUS | Status: DC | PRN
  Administered 2017-06-07: 30 ug/min via INTRAVENOUS

## 2017-06-07 MED ORDER — CALCIUM-VITAMIN D 500-200 MG-UNIT PO TABS
1.0000 | ORAL_TABLET | Freq: Every day | ORAL | Status: DC
  Administered 2017-06-08 – 2017-06-09 (×2): 1 via ORAL
  Filled 2017-06-07 (×3): qty 1

## 2017-06-07 MED ORDER — DEXAMETHASONE SODIUM PHOSPHATE 10 MG/ML IJ SOLN
8.0000 mg | Freq: Once | INTRAMUSCULAR | Status: AC
  Administered 2017-06-07: 8 mg via INTRAVENOUS

## 2017-06-07 MED ORDER — LIDOCAINE HCL (PF) 2 % IJ SOLN
INTRAMUSCULAR | Status: AC
  Filled 2017-06-07: qty 10

## 2017-06-07 MED ORDER — MAGNESIUM HYDROXIDE 400 MG/5ML PO SUSP
30.0000 mL | Freq: Every day | ORAL | Status: DC | PRN
  Administered 2017-06-08: 30 mL via ORAL
  Filled 2017-06-07 (×2): qty 30

## 2017-06-07 MED ORDER — ACETAMINOPHEN 325 MG PO TABS: 650.0000 mg | ORAL_TABLET | ORAL | Status: DC | PRN

## 2017-06-07 MED ORDER — ACETAMINOPHEN 650 MG RE SUPP: 650.0000 mg | RECTAL | Status: DC | PRN

## 2017-06-07 SURGICAL SUPPLY — 63 items
BATTERY INSTRU NAVIGATION (MISCELLANEOUS) ×8 IMPLANT
BLADE SAW 1 (BLADE) ×2 IMPLANT
BLADE SAW 1/2 (BLADE) ×2 IMPLANT
BLADE SAW 70X12.5 (BLADE) IMPLANT
CANISTER SUCT 1200ML W/VALVE (MISCELLANEOUS) ×2 IMPLANT
CANISTER SUCT 3000ML PPV (MISCELLANEOUS) ×4 IMPLANT
CAPT KNEE TOTAL 3 ATTUNE ×2 IMPLANT
CEMENT HV SMART SET (Cement) ×4 IMPLANT
COOLER POLAR GLACIER W/PUMP (MISCELLANEOUS) ×2 IMPLANT
CUFF TOURN 24 STER (MISCELLANEOUS) ×2 IMPLANT
CUFF TOURN 30 STER DUAL PORT (MISCELLANEOUS) IMPLANT
DRAPE SHEET LG 3/4 BI-LAMINATE (DRAPES) ×2 IMPLANT
DRSG DERMACEA 8X12 NADH (GAUZE/BANDAGES/DRESSINGS) ×2 IMPLANT
DRSG OPSITE POSTOP 4X14 (GAUZE/BANDAGES/DRESSINGS) ×2 IMPLANT
DRSG TEGADERM 4X4.75 (GAUZE/BANDAGES/DRESSINGS) ×2 IMPLANT
DURAPREP 26ML APPLICATOR (WOUND CARE) ×4 IMPLANT
ELECT CAUTERY BLADE 6.4 (BLADE) ×2 IMPLANT
ELECT REM PT RETURN 9FT ADLT (ELECTROSURGICAL) ×2
ELECTRODE REM PT RTRN 9FT ADLT (ELECTROSURGICAL) ×1 IMPLANT
EVACUATOR 1/8 PVC DRAIN (DRAIN) ×2 IMPLANT
EX-PIN ORTHOLOCK NAV 4X150 (PIN) ×4 IMPLANT
GLOVE BIOGEL M STRL SZ7.5 (GLOVE) ×4 IMPLANT
GLOVE BIOGEL PI IND STRL 9 (GLOVE) ×1 IMPLANT
GLOVE BIOGEL PI INDICATOR 9 (GLOVE) ×1
GLOVE INDICATOR 8.0 STRL GRN (GLOVE) ×2 IMPLANT
GLOVE SURG SYN 9.0  PF PI (GLOVE) ×1
GLOVE SURG SYN 9.0 PF PI (GLOVE) ×1 IMPLANT
GOWN STRL REUS W/ TWL LRG LVL3 (GOWN DISPOSABLE) ×2 IMPLANT
GOWN STRL REUS W/TWL 2XL LVL3 (GOWN DISPOSABLE) ×2 IMPLANT
GOWN STRL REUS W/TWL LRG LVL3 (GOWN DISPOSABLE) ×2
HOLDER FOLEY CATH W/STRAP (MISCELLANEOUS) ×2 IMPLANT
HOOD PEEL AWAY FLYTE STAYCOOL (MISCELLANEOUS) ×4 IMPLANT
KIT TURNOVER KIT A (KITS) ×2 IMPLANT
KNIFE SCULPS 14X20 (INSTRUMENTS) ×2 IMPLANT
LABEL OR SOLS (LABEL) ×2 IMPLANT
NDL SAFETY ECLIPSE 18X1.5 (NEEDLE) ×1 IMPLANT
NEEDLE HYPO 18GX1.5 SHARP (NEEDLE) ×1
NEEDLE SPNL 20GX3.5 QUINCKE YW (NEEDLE) ×4 IMPLANT
NS IRRIG 500ML POUR BTL (IV SOLUTION) ×2 IMPLANT
PACK TOTAL KNEE (MISCELLANEOUS) ×2 IMPLANT
PAD WRAPON POLAR KNEE (MISCELLANEOUS) ×1 IMPLANT
PIN DRILL QUICK PACK ×2 IMPLANT
PIN FIXATION 1/8DIA X 3INL (PIN) ×2 IMPLANT
PULSAVAC PLUS IRRIG FAN TIP (DISPOSABLE) ×2
SOL .9 NS 3000ML IRR  AL (IV SOLUTION) ×1
SOL .9 NS 3000ML IRR UROMATIC (IV SOLUTION) ×1 IMPLANT
SOL PREP PVP 2OZ (MISCELLANEOUS) ×2
SOLUTION PREP PVP 2OZ (MISCELLANEOUS) ×1 IMPLANT
SPONGE DRAIN TRACH 4X4 STRL 2S (GAUZE/BANDAGES/DRESSINGS) ×2 IMPLANT
STAPLER SKIN PROX 35W (STAPLE) ×2 IMPLANT
STRAP TIBIA SHORT (MISCELLANEOUS) ×2 IMPLANT
SUCTION FRAZIER HANDLE 10FR (MISCELLANEOUS) ×1
SUCTION TUBE FRAZIER 10FR DISP (MISCELLANEOUS) ×1 IMPLANT
SUT VIC AB 0 CT1 36 (SUTURE) ×2 IMPLANT
SUT VIC AB 1 CT1 36 (SUTURE) ×4 IMPLANT
SUT VIC AB 2-0 CT2 27 (SUTURE) ×2 IMPLANT
SYR 20CC LL (SYRINGE) ×2 IMPLANT
SYR 30ML LL (SYRINGE) ×4 IMPLANT
TIP FAN IRRIG PULSAVAC PLUS (DISPOSABLE) ×1 IMPLANT
TOWEL OR 17X26 4PK STRL BLUE (TOWEL DISPOSABLE) ×2 IMPLANT
TOWER CARTRIDGE SMART MIX (DISPOSABLE) ×2 IMPLANT
TRAY FOLEY W/METER SILVER 16FR (SET/KITS/TRAYS/PACK) ×2 IMPLANT
WRAPON POLAR PAD KNEE (MISCELLANEOUS) ×2

## 2017-06-07 NOTE — Anesthesia Procedure Notes (Signed)
Spinal  Patient location during procedure: OR Start time: 06/07/2017 7:24 AM End time: 06/07/2017 7:42 AM Staffing Anesthesiologist: Martha Clan, MD Resident/CRNA: Bernardo Heater, CRNA Performed: resident/CRNA and anesthesiologist  Preanesthetic Checklist Completed: patient identified, site marked, surgical consent, pre-op evaluation, timeout performed, IV checked, risks and benefits discussed and monitors and equipment checked Spinal Block Patient position: sitting Prep: ChloraPrep Patient monitoring: heart rate, continuous pulse ox, blood pressure and cardiac monitor Approach: midline Location: L2-3 Injection technique: single-shot Needle Needle type: Quincke  Needle gauge: 22 G Needle length: 9 cm Assessment Sensory level: T10 Additional Notes Negative paresthesia. Negative blood return. Positive free-flowing CSF. Expiration date of kit checked and confirmed. Patient tolerated procedure well, without complications.

## 2017-06-07 NOTE — Transfer of Care (Signed)
Immediate Anesthesia Transfer of Care Note  Patient: Dawn Clark  Procedure(s) Performed: COMPUTER ASSISTED TOTAL KNEE ARTHROPLASTY (Left Knee)  Patient Location: PACU  Anesthesia Type:Spinal  Level of Consciousness: sedated  Airway & Oxygen Therapy: Patient Spontanous Breathing and Patient connected to nasal cannula oxygen  Post-op Assessment: Report given to RN and Post -op Vital signs reviewed and stable  Post vital signs: Reviewed and stable  Last Vitals:  Vitals:   06/07/17 0617  BP: 138/80  Pulse: 79  Resp: 16  Temp: 36.5 C  SpO2: 98%    Last Pain:  Vitals:   06/07/17 0617  TempSrc: Oral         Complications: No apparent anesthesia complications

## 2017-06-07 NOTE — Op Note (Signed)
OPERATIVE NOTE  DATE OF SURGERY:  06/07/2017  PATIENT NAME:  Ledell PeoplesRuby Lea Tsuchiya   DOB: November 09, 1928  MRN: [id number]  PRE-OPERATIVE DIAGNOSIS: Degenerative arthrosis of the left knee, primary  POST-OPERATIVE DIAGNOSIS:  Same  PROCEDURE:  Left total knee arthroplasty using computer-assisted navigation  SURGEON:  Jena GaussJames P Tacori Kvamme, Jr. M.D.  ASSISTANT:  Van ClinesJon Wolfe, PA (present and scrubbed throughout the case, critical for assistance with exposure, retraction, instrumentation, and closure)  ANESTHESIA: spinal  ESTIMATED BLOOD LOSS: 50 mL  FLUIDS REPLACED: 1400 mL of crystalloid  TOURNIQUET TIME: 74 minutes  DRAINS: 2 medium Hemovac drains  SOFT TISSUE RELEASES: Anterior cruciate ligament, posterior cruciate ligament, deep medial collateral ligament, patellofemoral ligament  IMPLANTS UTILIZED: DePuy Attune size 4N posterior stabilized femoral component (cemented), size 4 rotating platform tibial component (cemented), 35 mm medialized dome patella (cemented), and a 5 mm stabilized rotating platform polyethylene insert.  INDICATIONS FOR SURGERY: Ledell PeoplesRuby Lea Beyersdorf is a 84 y.o. year old female with a long history of progressive knee pain. X-rays demonstrated severe degenerative changes in tricompartmental fashion. The patient had not seen any significant improvement despite conservative nonsurgical intervention. After discussion of the risks and benefits of surgical intervention, the patient expressed understanding of the risks benefits and agree with plans for total knee arthroplasty.   The risks, benefits, and alternatives were discussed at length including but not limited to the risks of infection, bleeding, nerve injury, stiffness, blood clots, the need for revision surgery, cardiopulmonary complications, among others, and they were willing to proceed.  PROCEDURE IN DETAIL: The patient was brought into the operating room and, after adequate spinal anesthesia was achieved, a tourniquet was placed on  the patient's upper thigh. The patient's knee and leg were cleaned and prepped with alcohol and DuraPrep and draped in the usual sterile fashion. A "timeout" was performed as per usual protocol. The lower extremity was exsanguinated using an Esmarch, and the tourniquet was inflated to 300 mmHg. An anterior longitudinal incision was made followed by a standard mid vastus approach. The deep fibers of the medial collateral ligament were elevated in a subperiosteal fashion off of the medial flare of the tibia so as to maintain a continuous soft tissue sleeve. The patella was subluxed laterally and the patellofemoral ligament was incised. Inspection of the knee demonstrated severe degenerative changes with full-thickness loss of articular cartilage. Osteophytes were debrided using a rongeur. Anterior and posterior cruciate ligaments were excised. Two 4.0 mm Schanz pins were inserted in the femur and into the tibia for attachment of the array of trackers used for computer-assisted navigation. Hip center was identified using a circumduction technique. Distal landmarks were mapped using the computer. The distal femur and proximal tibia were mapped using the computer. The distal femoral cutting guide was positioned using computer-assisted navigation so as to achieve a 5 distal valgus cut. The femur was sized and it was felt that a size 4N femoral component was appropriate. A size 4 femoral cutting guide was positioned and the anterior cut was performed and verified using the computer. This was followed by completion of the posterior and chamfer cuts. Femoral cutting guide for the central box was then positioned in the center box cut was performed.  Attention was then directed to the proximal tibia. Medial and lateral menisci were excised. The extramedullary tibial cutting guide was positioned using computer-assisted navigation so as to achieve a 0 varus-valgus alignment and 3 posterior slope. The cut was performed and  verified using the computer. The proximal tibia  was sized and it was felt that a size 4 tibial tray was appropriate. Tibial and femoral trials were inserted followed by insertion of a 5 mm polyethylene insert. This allowed for excellent mediolateral soft tissue balancing both in flexion and in full extension. Finally, the patella was cut and prepared so as to accommodate a 35 mm medialized dome patella. A patella trial was placed and the knee was placed through a range of motion with excellent patellar tracking appreciated. The femoral trial was removed after debridement of posterior osteophytes. The central post-hole for the tibial component was reamed followed by insertion of a keel punch. Tibial trials were then removed. Cut surfaces of bone were irrigated with copious amounts of normal saline with antibiotic solution using pulsatile lavage and then suctioned dry. Polymethylmethacrylate cement was prepared in the usual fashion using a vacuum mixer. Cement was applied to the cut surface of the proximal tibia as well as along the undersurface of a size 4 rotating platform tibial component. Tibial component was positioned and impacted into place. Excess cement was removed using Personal assistant. Cement was then applied to the cut surfaces of the femur as well as along the posterior flanges of the size 4N femoral component. The femoral component was positioned and impacted into place. Excess cement was removed using Personal assistant. A 5 mm polyethylene trial was inserted and the knee was brought into full extension with steady axial compression applied. Finally, cement was applied to the backside of a 35 mm medialized dome patella and the patellar component was positioned and patellar clamp applied. Excess cement was removed using Personal assistant. After adequate curing of the cement, the tourniquet was deflated after a total tourniquet time of 74 minutes. Hemostasis was achieved using electrocautery. The knee was  irrigated with copious amounts of normal saline with antibiotic solution using pulsatile lavage and then suctioned dry. 20 mL of 1.3% Exparel and 60 mL of 0.25% Marcaine in 40 mL of normal saline was injected along the posterior capsule, medial and lateral gutters, and along the arthrotomy site. A 5 mm stabilized rotating platform polyethylene insert was inserted and the knee was placed through a range of motion with excellent mediolateral soft tissue balancing appreciated and excellent patellar tracking noted. 2 medium drains were placed in the wound bed and brought out through separate stab incisions. The medial parapatellar portion of the incision was reapproximated using interrupted sutures of #1 Vicryl. Subcutaneous tissue was approximated in layers using first #0 Vicryl followed #2-0 Vicryl. The skin was approximated with skin staples. A sterile dressing was applied.  The patient tolerated the procedure well and was transported to the recovery room in stable condition.    Saharah Sherrow P. Angie Fava., M.D.

## 2017-06-07 NOTE — H&P (Signed)
The patient has been re-examined, and the chart reviewed, and there have been no interval changes to the documented history and physical.    The risks, benefits, and alternatives have been discussed at length. The patient expressed understanding of the risks benefits and agreed with plans for surgical intervention.  James P. Hooten, Jr. M.D.    

## 2017-06-07 NOTE — Evaluation (Signed)
Physical Therapy Evaluation Patient Details Name: Dawn PeoplesRuby Lea Clark MRN: [id number] DOB: 1929-04-09 Today's Date: 06/07/2017   History of Present Illness  Pt is an 84 yo female diagnosed with degenerative arthrosis of the L knee and is s/p elective L TKA.  Clinical Impression  Pt presents with deficits in strength, transfers, mobility, gait, balance, L knee ROM, and activity tolerance.  Pt required extra time and effort during bed mobility tasks but no physical assistance.  Pt c/o min dizziness upon sitting up to EOB with BP taken at 130/75 mmHg, virtually unchanged from previous BP in supine.  Pt's dizziness resolved in 1-2 min in sitting.  Pt's HR and SpO2 WNL throughout session.  Pt required increased effort when standing from EOB but was steady upon initial stand.  Pt able to amb a maximum of 4-5' with RW and CGA with antalgic, step-to gait pattern but was steady without LOB.  Pt will benefit from PT services in a SNF setting upon discharge to safely address above deficits for decreased caregiver assistance and eventual return to PLOF.      Follow Up Recommendations SNF    Equipment Recommendations  None recommended by PT    Recommendations for Other Services       Precautions / Restrictions Precautions Precautions: Fall;Knee Precaution Booklet Issued: Yes (comment) Required Braces or Orthoses: (Pt able to perform Ind LLE SLRs without extensor lag, no KI required) Restrictions Weight Bearing Restrictions: Yes LLE Weight Bearing: Weight bearing as tolerated      Mobility  Bed Mobility Overal bed mobility: Modified Independent Bed Mobility: Supine to Sit;Sit to Supine     Supine to sit: Modified independent (Device/Increase time) Sit to supine: Modified independent (Device/Increase time)   General bed mobility comments: Increased time and effort required during bed mobility tasks but no physical assistance  Transfers Overall transfer level: Needs assistance Equipment used:  Rolling walker (2 wheeled) Transfers: Sit to/from Stand Sit to Stand: Min guard         General transfer comment: Min verbal cues for sequencing with effortful stand from sitting at EOB but no physical assistance required  Ambulation/Gait Ambulation/Gait assistance: Min guard Ambulation Distance (Feet): 5 Feet Assistive device: Rolling walker (2 wheeled) Gait Pattern/deviations: Step-to pattern;Decreased step length - right;Decreased stance time - left;Antalgic   Gait velocity interpretation: Below normal speed for age/gender General Gait Details: Antalgic on the LLE with limited activity tolerance  Stairs Stairs: (Deferred)          Wheelchair Mobility    Modified Rankin (Stroke Patients Only)       Balance Overall balance assessment: Needs assistance Sitting-balance support: Feet supported;No upper extremity supported;Bilateral upper extremity supported;Feet unsupported Sitting balance-Leahy Scale: Good Sitting balance - Comments: Posterior lean initially in sitting that resolved during the session Postural control: Posterior lean Standing balance support: Bilateral upper extremity supported Standing balance-Leahy Scale: Fair Standing balance comment: Heavy use of BUEs on RW during session                             Pertinent Vitals/Pain Pain Assessment: 0-10 Pain Score: 5  Pain Location: L knee Pain Descriptors / Indicators: Aching;Sore;Operative site guarding Pain Intervention(s): Premedicated before session;Monitored during session;Limited activity within patient's tolerance    Home Living Family/patient expects to be discharged to:: Private residence Living Arrangements: Alone Available Help at Discharge: Family;Available PRN/intermittently Type of Home: House Home Access: Stairs to enter Entrance Stairs-Rails: Right;Left(Too wide for both)  Entrance Stairs-Number of Steps: 4 Home Layout: One level Home Equipment: Walker - 2 wheels;Walker -  4 wheels;Other (comment) Additional Comments: Pt has life alert; ADs above are on loan from a friend but pt has access to them as long as needed    Prior Function Level of Independence: Independent         Comments: Pt Ind with amb without AD community distances without AD, no fall history, Ind with ADLs     Hand Dominance   Dominant Hand: Right    Extremity/Trunk Assessment   Upper Extremity Assessment Upper Extremity Assessment: Overall WFL for tasks assessed    Lower Extremity Assessment Lower Extremity Assessment: Generalized weakness;LLE deficits/detail LLE Deficits / Details: Pt Ind with LLE SLRs without extensor lag; B ankle DF and PF strong against manual resistance LLE: Unable to fully assess due to pain LLE Sensation: (Sensation to light touch grossly intact to BLEs)       Communication   Communication: No difficulties  Cognition Arousal/Alertness: Awake/alert Behavior During Therapy: WFL for tasks assessed/performed Overall Cognitive Status: Within Functional Limits for tasks assessed                                        General Comments      Exercises Total Joint Exercises Ankle Circles/Pumps: AROM;Both;10 reps;5 reps Quad Sets: Strengthening;AROM;Left;5 reps;10 reps Gluteal Sets: Strengthening;Both;10 reps Straight Leg Raises: AROM;Both;10 reps Long Arc Quad: AROM;Left;5 reps;10 reps Knee Flexion: AROM;Left;5 reps;10 reps Goniometric ROM: L knee A/AAROM: flex 72/78 deg, ext -6/-2 deg Marching in Standing: AROM;Both;5 reps Other Exercises Other Exercises: HEP education/review per handout Other Exercises: Positioning education provided   Assessment/Plan    PT Assessment Patient needs continued PT services  PT Problem List Decreased strength;Decreased range of motion;Decreased activity tolerance;Decreased balance;Decreased mobility;Decreased knowledge of use of DME       PT Treatment Interventions DME instruction;Gait  training;Stair training;Functional mobility training;Balance training;Therapeutic exercise;Therapeutic activities;Patient/family education    PT Goals (Current goals can be found in the Care Plan section)  Acute Rehab PT Goals Patient Stated Goal: "I'd like to get my strength back" PT Goal Formulation: With patient Time For Goal Achievement: 06/20/17 Potential to Achieve Goals: Good    Frequency BID   Barriers to discharge Inaccessible home environment;Decreased caregiver support      Co-evaluation               AM-PAC PT "6 Clicks" Daily Activity  Outcome Measure Difficulty turning over in bed (including adjusting bedclothes, sheets and blankets)?: A Little Difficulty moving from lying on back to sitting on the side of the bed? : A Little Difficulty sitting down on and standing up from a chair with arms (e.g., wheelchair, bedside commode, etc,.)?: Unable Help needed moving to and from a bed to chair (including a wheelchair)?: A Little Help needed walking in hospital room?: A Lot Help needed climbing 3-5 steps with a railing? : A Lot 6 Click Score: 14    End of Session Equipment Utilized During Treatment: Gait belt Activity Tolerance: Patient tolerated treatment well;No increased pain Patient left: in bed;with call bell/phone within reach;with bed alarm set;with family/visitor present;with SCD's reapplied;Other (comment)(Polar care donned to L knee with L heel in bone foam; no recliner in pt's room, nsg notified) Nurse Communication: Mobility status;Other (comment)(No recliner in room) PT Visit Diagnosis: Other abnormalities of gait and mobility (R26.89);Muscle weakness (generalized) (M62.81)  Time: 0981-1914 PT Time Calculation (min) (ACUTE ONLY): 50 min   Charges:   PT Evaluation $PT Eval Low Complexity: 1 Low PT Treatments $Therapeutic Exercise: 8-22 mins $Therapeutic Activity: 8-22 mins   PT G Codes:        DElly Modena PT, DPT 06/07/17, 3:36 PM

## 2017-06-07 NOTE — Progress Notes (Signed)
ADMISSION NOTE:  Pt arrived to room 140 from PACU. Alert and oriented X4. Surgical dressing clean dry and intact. Bone foam, hemovac, polar care, ted hose, foot pump in place. No complaints of pain.  Bed in lowest position call bell in reach and bed alarm on.

## 2017-06-07 NOTE — Anesthesia Post-op Follow-up Note (Signed)
Anesthesia QCDR form completed.        

## 2017-06-07 NOTE — Anesthesia Preprocedure Evaluation (Signed)
Anesthesia Evaluation  Patient identified by MRN, date of birth, ID band Patient awake    Reviewed: Allergy & Precautions, H&P , NPO status , Patient's Chart, lab work & pertinent test results, reviewed documented beta blocker date and time   History of Anesthesia Complications Negative for: history of anesthetic complications  Airway Mallampati: I  TM Distance: >3 FB Neck ROM: full    Dental  (+) Caps, Dental Advidsory Given, Teeth Intact, Missing   Pulmonary neg pulmonary ROS,           Cardiovascular Exercise Tolerance: Good negative cardio ROS       Neuro/Psych negative neurological ROS  negative psych ROS   GI/Hepatic negative GI ROS, Neg liver ROS,   Endo/Other  negative endocrine ROS  Renal/GU negative Renal ROS  negative genitourinary   Musculoskeletal   Abdominal   Peds  Hematology negative hematology ROS (+)   Anesthesia Other Findings Past Medical History: No date: Arthritis No date: Osteoporosis No date: Vertigo     Comment:  x1 over 2 yrs   Reproductive/Obstetrics negative OB ROS                             Anesthesia Physical Anesthesia Plan  ASA: II  Anesthesia Plan: Spinal   Post-op Pain Management:    Induction:   PONV Risk Score and Plan: 2 and Ondansetron and Dexamethasone  Airway Management Planned: Simple Face Mask  Additional Equipment:   Intra-op Plan:   Post-operative Plan:   Informed Consent: I have reviewed the patients History and Physical, chart, labs and discussed the procedure including the risks, benefits and alternatives for the proposed anesthesia with the patient or authorized representative who has indicated his/her understanding and acceptance.   Dental Advisory Given  Plan Discussed with: Anesthesiologist, CRNA and Surgeon  Anesthesia Plan Comments:         Anesthesia Quick Evaluation

## 2017-06-07 NOTE — NC FL2 (Signed)
[place] MEDICAID FL2 LEVEL OF CARE SCREENING TOOL     IDENTIFICATION  Patient Name: Rosalena Mccorry Birthdate: June 14, 1928 Sex: female Admission Date (Current Location): 06/07/2017  [place] and [place] Number:  Chiropodist and Address:  [facility], [address], [place], [place] [address]      Provider Number: [id number]  Attending Physician Name and Address:  Donato Heinz, MD  Relative Name and Phone Number:       Current Level of Care: Hospital Recommended Level of Care: Skilled Nursing Facility Prior Approval Number:    Date Approved/Denied:   PASRR Number: [id number])  Discharge Plan: SNF    Current Diagnoses: Patient Active Problem List   Diagnosis Date Noted  . S/P total knee arthroplasty 06/07/2017  . Primary osteoarthritis of left knee 03/14/2017    Orientation RESPIRATION BLADDER Height & Weight     Self, Situation, Place, Time  Normal Continent Weight: 143 lb (64.9 kg) Height:  5\' 2"  (157.5 cm)  BEHAVIORAL SYMPTOMS/MOOD NEUROLOGICAL BOWEL NUTRITION STATUS      Continent Diet(Diet: Clear Liquid to be Advanced. )  AMBULATORY STATUS COMMUNICATION OF NEEDS Skin   Extensive Assist Verbally Surgical wounds(Incision: Left Knee. )                       Personal Care Assistance Level of Assistance  Bathing, Feeding, Dressing Bathing Assistance: Limited assistance Feeding assistance: Independent Dressing Assistance: Limited assistance     Functional Limitations Info  Sight, Hearing, Speech Sight Info: Adequate Hearing Info: Adequate Speech Info: Adequate    SPECIAL CARE FACTORS FREQUENCY  PT (By licensed PT), OT (By licensed OT)     PT Frequency: (5) OT Frequency: (5)            Contractures      Additional Factors Info  Code Status, Allergies Code Status Info: (Full Code. ) Allergies Info: (No Known Allergies. )           Current Medications (06/07/2017):  This is the current hospital  active medication list Current Facility-Administered Medications  Medication Dose Route Frequency Provider Last Rate Last Dose  . 0.9 %  sodium chloride infusion   Intravenous Continuous Hooten, Illene Labrador, MD 100 mL/hr at 06/07/17 1151    . acetaminophen (OFIRMEV) IV 1,000 mg  1,000 mg Intravenous Q6H Hooten, Illene Labrador, MD      . acetaminophen (TYLENOL) tablet 650 mg  650 mg Oral Q4H PRN Hooten, Illene Labrador, MD       Or  . acetaminophen (TYLENOL) suppository 650 mg  650 mg Rectal Q4H PRN Hooten, Illene Labrador, MD      . alum & mag hydroxide-simeth (MAALOX/MYLANTA) 200-200-20 MG/5ML suspension 30 mL  30 mL Oral Q4H PRN Hooten, Illene Labrador, MD      . bisacodyl (DULCOLAX) suppository 10 mg  10 mg Rectal Daily PRN Hooten, Illene Labrador, MD      . calcium-vitamin D 500-200 MG-UNIT per tablet 1 tablet  1 tablet Oral Daily Hooten, Illene Labrador, MD      . ceFAZolin (ANCEF) IVPB 2g/100 mL premix  2 g Intravenous Q6H Hooten, Illene Labrador, MD   Stopped at 06/07/17 1330  . celecoxib (CELEBREX) capsule 200 mg  200 mg Oral Q12H Hooten, Illene Labrador, MD      . diphenhydrAMINE (BENADRYL) 12.5 MG/5ML elixir 12.5-25 mg  12.5-25 mg Oral Q4H PRN Donato Heinz, MD      . Melene Muller ON 06/08/2017]  enoxaparin (LOVENOX) injection 30 mg  30 mg Subcutaneous Q12H Hooten, Illene LabradorJames P, MD      . ferrous sulfate tablet 325 mg  325 mg Oral BID WC Hooten, Illene LabradorJames P, MD      . gabapentin (NEURONTIN) capsule 300 mg  300 mg Oral QHS Hooten, Illene LabradorJames P, MD      . magnesium hydroxide (MILK OF MAGNESIA) suspension 30 mL  30 mL Oral Daily PRN Hooten, Illene LabradorJames P, MD      . menthol-cetylpyridinium (CEPACOL) lozenge 3 mg  1 lozenge Oral PRN Hooten, Illene LabradorJames P, MD       Or  . phenol (CHLORASEPTIC) mouth spray 1 spray  1 spray Mouth/Throat PRN Hooten, Illene LabradorJames P, MD      . metoCLOPramide (REGLAN) tablet 10 mg  10 mg Oral TID AC & HS Hooten, Illene LabradorJames P, MD   10 mg at 06/07/17 1300  . morphine 2 MG/ML injection 2 mg  2 mg Intravenous Q2H PRN Hooten, Illene LabradorJames P, MD      . multivitamin with minerals tablet  1 tablet  1 tablet Oral Daily Hooten, Illene LabradorJames P, MD      . ondansetron (ZOFRAN) tablet 4 mg  4 mg Oral Q6H PRN Hooten, Illene LabradorJames P, MD       Or  . ondansetron (ZOFRAN) injection 4 mg  4 mg Intravenous Q6H PRN Hooten, Illene LabradorJames P, MD      . oxyCODONE (Oxy IR/ROXICODONE) immediate release tablet 10 mg  10 mg Oral Q3H PRN Hooten, Illene LabradorJames P, MD      . oxyCODONE (Oxy IR/ROXICODONE) immediate release tablet 5 mg  5 mg Oral Q3H PRN Donato HeinzHooten, James P, MD   5 mg at 06/07/17 1228  . pantoprazole (PROTONIX) EC tablet 40 mg  40 mg Oral BID Hooten, Illene LabradorJames P, MD   40 mg at 06/07/17 1300  . senna-docusate (Senokot-S) tablet 1 tablet  1 tablet Oral BID Hooten, Illene LabradorJames P, MD      . sodium phosphate (FLEET) 7-19 GM/118ML enema 1 enema  1 enema Rectal Once PRN Hooten, Illene LabradorJames P, MD      . traMADol Janean Sark(ULTRAM) tablet 50-100 mg  50-100 mg Oral Q4H PRN Hooten, Illene LabradorJames P, MD         Discharge Medications: Please see discharge summary for a list of discharge medications.  Relevant Imaging Results:  Relevant Lab Results:   Additional Information (SSN: [id number])  Nehemiah Montee, Darleen CrockerBailey M, LCSW

## 2017-06-07 NOTE — Progress Notes (Signed)

## 2017-06-08 ENCOUNTER — Encounter
Admission: RE | Admit: 2017-06-08 | Discharge: 2017-06-08 | Disposition: A | Discharge status: Home / Self Care | Payer: Medicare PPO | Source: Ambulatory Visit | Attending: Internal Medicine | Admitting: Internal Medicine

## 2017-06-08 MED ORDER — TRAMADOL HCL 50 MG PO TABS: 50.0000 mg | ORAL_TABLET | ORAL | 0 refills | Status: DC | PRN

## 2017-06-08 MED ORDER — ENOXAPARIN SODIUM 30 MG/0.3ML ~~LOC~~ SOLN: 30.0000 mg | Freq: Two times a day (BID) | SUBCUTANEOUS | 0 refills | Status: DC

## 2017-06-08 MED ORDER — OXYCODONE HCL 5 MG PO TABS: 5.0000 mg | ORAL_TABLET | ORAL | 0 refills | Status: DC | PRN

## 2017-06-08 NOTE — Clinical Social Work Note (Addendum)
Clinical Social Work Assessment  Patient Details  Name: Dawn Clark MRN: [id number] Date of Birth: [birth year]  Date of referral:  06/08/17               Reason for consult:  Facility Placement, Discharge Planning                Permission sought to share information with:  Chartered certified accountant granted to share information::  Yes, Verbal Permission Granted  Name::      [place]::   [place]  Relationship::     Contact Information:     Housing/Transportation Living arrangements for the past 2 months:  [place] of Information:  Patient Patient Interpreter Needed:  None Criminal Activity/Legal Involvement Pertinent to Current Situation/Hospitalization:  No - Comment as needed Significant Relationships:  Adult Children Lives with:  Self Do you feel safe going back to the place where you live?  No Need for family participation in patient care:  Yes (Comment)  Care giving concerns: Patient lives alone in [place].   Social Worker assessment / plan: Holiday representative (CSW) received SNF consult. PT is recommending SNF. Social work Theatre manager met with patient alone at bedside. Patient was sitting up in bed alert and was alert and oriented x4. Social work Theatre manager introduced self and explained the role of the [place]. Patient shared that she lives in [place] alone and son Dawn Clark [phone]) is her primary contact. Social work Theatre manager explained that PT is recommending SNF and [employer] will have to approve it. Patient verbalized her understanding, is interested in SNF placement and prefers [facility]. Social work Theatre manager explained that [facility] has mostly shared rooms. Patient stated she is comfortable sharing a room. FL2 completed and faxed out.   Social work Theatre manager presented bed offers patient chose [facility]. Sharyn Lull, admission coordinator at [facility], is aware of accepted bed offer. CSW has  started [employer] authorization via [employer]. CSW and social work Theatre manager will continue to follow up and assist.  Employment status:  Retired Nurse, adult PT Recommendations:  [place] / Referral to community resources:  [place]  Patient/Family's Response to care: Patient is agreeable to SNF placement and chose [facility].  Patient/Family's Understanding of and Emotional Response to Diagnosis, Current Treatment, and Prognosis: Patient was pleasant and thanked social work Theatre manager for her assistance.  Emotional Assessment Appearance:  Appears stated age Attitude/Demeanor/Rapport:    Affect (typically observed):  Adaptable, Calm, Pleasant Orientation:  Oriented to Self, Oriented to Place, Oriented to  Time, Oriented to Situation Alcohol / Substance use:  Not Applicable Psych involvement (Current and /or in the community):  No (Comment)  Discharge Needs  Concerns to be addressed:  Care Coordination, Discharge Planning Concerns Readmission within the last 30 days:  No Current discharge risk:  Dependent with Mobility Barriers to Discharge:  Continued Medical Work up   Smith Mince, Student-Social Work 06/08/2017, 8:41 AM

## 2017-06-08 NOTE — Progress Notes (Signed)
   Subjective: 1 Day Post-Op Procedure(s) (LRB): COMPUTER ASSISTED TOTAL KNEE ARTHROPLASTY (Left) Patient reports pain as mild.   Patient is well, and has had no acute complaints or problems The patient did will day of surgery with physical therapy Plan is to go Rehab after hospital stay. no nausea and no vomiting Patient denies any chest pains or shortness of breath. Patient states that she had an episode last night where she woke up and thought she was at home and somebody had broken into her home.  Otherwise doing well.  Slept most of the night  Objective: Vital signs in last 24 hours: Temp:  [97.1 F (36.2 C)-98.2 F (36.8 C)] 97.5 F (36.4 C) (02/05 0438) Pulse Rate:  [71-86] 71 (02/05 0438) Resp:  [11-28] 16 (02/05 0438) BP: (93-134)/(48-77) 101/52 (02/05 0438) SpO2:  [94 %-100 %] 100 % (02/05 0438) Heels are non tender and elevated off the bed using rolled towels along with bone foam under operative leg  Intake/Output from previous day: 02/04 0701 - 02/05 0700 In: 2250 [P.O.:600; I.V.:1350; IV Piggyback:100] Out: 2418 [Urine:2160; Drains:208; Blood:50] Intake/Output this shift: No intake/output data recorded.  No results for input(s): HGB in the last 72 hours. No results for input(s): WBC, RBC, HCT, PLT in the last 72 hours. No results for input(s): NA, K, CL, CO2, BUN, CREATININE, GLUCOSE, CALCIUM in the last 72 hours. No results for input(s): LABPT, INR in the last 72 hours.  EXAM General - Patient is Alert, Appropriate and Oriented Extremity - Neurologically intact Neurovascular intact Sensation intact distally Intact pulses distally Dorsiflexion/Plantar flexion intact Compartment soft Dressing - dressing C/D/I Motor Function - intact, moving foot and toes well on exam.  Able to do straight leg raise on room  Past Medical History:  Diagnosis Date  . Arthritis   . Osteoporosis   . Vertigo    x1 over 2 yrs    Assessment/Plan: 1 Day Post-Op Procedure(s)  (LRB): COMPUTER ASSISTED TOTAL KNEE ARTHROPLASTY (Left) Active Problems:   S/P total knee arthroplasty  Estimated body mass index is 26.16 kg/m as calculated from the following:   Height as of this encounter: 5\' 2"  (1.575 m).   Weight as of this encounter: 64.9 kg (143 lb). Advance diet Up with therapy D/C IV fluids Plan for discharge tomorrow Discharge to SNF  Labs: None DVT Prophylaxis - Lovenox, Foot Pumps and TED hose Weight-Bearing as tolerated to left leg D/C O2 and Pulse OX and try on Room Air Begin working on bowel movement  Jon R. [facility] Lag[facility] BeachWolfe PA [facility] 06/08/2017, 7:19 AM

## 2017-06-08 NOTE — Progress Notes (Addendum)
[facility]. Authorization number is [id number]. Patient is aware of above. Marcelino DusterMichelle, [facility] is also aware of above.   Molli KnockAnanda Hodgson, Social Work Intern [phone]

## 2017-06-08 NOTE — Anesthesia Postprocedure Evaluation (Signed)
Anesthesia Post Note  Patient: Dawn Clark  Procedure(s) Performed: COMPUTER ASSISTED TOTAL KNEE ARTHROPLASTY (Left Knee)  Patient location during evaluation: Nursing Unit Anesthesia Type: Spinal Level of consciousness: awake, awake and alert, oriented and patient cooperative Pain management: pain level controlled Vital Signs Assessment: post-procedure vital signs reviewed and stable Respiratory status: spontaneous breathing, nonlabored ventilation and respiratory function stable Cardiovascular status: stable Postop Assessment: no headache, no backache, patient able to bend at knees, no apparent nausea or vomiting and adequate PO intake Anesthetic complications: no     Last Vitals:  Vitals:   06/08/17 0041 06/08/17 0438  BP: (!) 107/50 (!) 101/52  Pulse: 85 71  Resp:  16  Temp:  (!) 36.4 C  SpO2: 98% 100%    Last Pain:  Vitals:   06/08/17 0438  TempSrc: Oral  PainSc:                  Lyn RecordsNoles,  Dawn Lenker R

## 2017-06-08 NOTE — Progress Notes (Signed)
Physical Therapy Treatment Patient Details Name: Dawn Clark MRN: [id number] DOB: Feb 20, 1929 Today's Date: 06/08/2017    History of Present Illness Pt is an 84 yo female diagnosed with degenerative arthrosis of the L knee and is s/p elective L TKA.    PT Comments    Pt is up to walk bedside and do minor exercise to her L knee after finding that her head is dizzy with standing and sitting bedside.  Her O2 sats were not at issue, 96% after stepping bedside.  Her plan is to work toward more independence at hosp for gait and to sit OOB in chair as one is available.  Will continue BID to progress her and minimize need to stay at SNF.   Follow Up Recommendations  SNF     Equipment Recommendations  None recommended by PT    Recommendations for Other Services       Precautions / Restrictions Precautions Precautions: Fall;Knee Precaution Booklet Issued: No Restrictions Weight Bearing Restrictions: Yes LLE Weight Bearing: Weight bearing as tolerated    Mobility  Bed Mobility Overal bed mobility: Modified Independent Bed Mobility: Supine to Sit;Sit to Supine     Supine to sit: Modified independent (Device/Increase time) Sit to supine: Modified independent (Device/Increase time)   General bed mobility comments: Increased time and effort required during bed mobility tasks but no physical assistance  Transfers Overall transfer level: Needs assistance Equipment used: Rolling walker (2 wheeled) Transfers: Sit to/from Stand Sit to Stand: Min guard         General transfer comment: reminders for sequence and safety  Ambulation/Gait Ambulation/Gait assistance: Min guard Ambulation Distance (Feet): 7 Feet Assistive device: Rolling walker (2 wheeled) Gait Pattern/deviations: Step-to pattern;Decreased stride length;Antalgic;Wide base of support;Trunk flexed Gait velocity: reduced Gait velocity interpretation: Below normal speed for age/gender General Gait Details: Antalgic on  the LLE with limited activity tolerance   Stairs            Wheelchair Mobility    Modified Rankin (Stroke Patients Only)       Balance Overall balance assessment: Needs assistance Sitting-balance support: Feet supported;Single extremity supported Sitting balance-Leahy Scale: Good     Standing balance support: Bilateral upper extremity supported;During functional activity Standing balance-Leahy Scale: Fair                              Cognition Arousal/Alertness: Awake/alert Behavior During Therapy: WFL for tasks assessed/performed Overall Cognitive Status: Within Functional Limits for tasks assessed                                        Exercises Total Joint Exercises Ankle Circles/Pumps: AROM;Both;10 reps Quad Sets: AROM;Both;10 reps Gluteal Sets: AROM;Both;10 reps Heel Slides: AAROM;Left;5 reps Goniometric ROM: 84 flexion    General Comments        Pertinent Vitals/Pain Pain Assessment: 0-10 Pain Score: 7  Pain Location: L knee Pain Descriptors / Indicators: Aching;Sore;Operative site guarding Pain Intervention(s): Limited activity within patient's tolerance;Monitored during session;Premedicated before session;Repositioned;Ice applied    Home Living Family/patient expects to be discharged to:: Private residence Living Arrangements: Alone Available Help at Discharge: Family;Available PRN/intermittently Type of Home: House Home Access: Stairs to enter Entrance Stairs-Rails: Right;Left Home Layout: One level Home Equipment: Environmental consultantWalker - 2 wheels;Walker - 4 wheels;Other (comment);Adaptive equipment Additional Comments: Pt has life alert; ADs above are on  loan from a friend but pt has access to them as long as needed    Prior Function        Comments: Pt Ind with amb without AD community distances without AD, no fall history, Ind with ADLs. Pt primarily takes tub baths.   PT Goals (current goals can now be found in the care  plan section) Acute Rehab PT Goals Patient Stated Goal: go to rehab and get stronger Progress towards PT goals: Progressing toward goals    Frequency    BID      PT Plan Current plan remains appropriate    Co-evaluation              AM-PAC PT "6 Clicks" Daily Activity  Outcome Measure  Difficulty turning over in bed (including adjusting bedclothes, sheets and blankets)?: A Little Difficulty moving from lying on back to sitting on the side of the bed? : A Little Difficulty sitting down on and standing up from a chair with arms (e.g., wheelchair, bedside commode, etc,.)?: Unable Help needed moving to and from a bed to chair (including a wheelchair)?: A Little Help needed walking in hospital room?: A Little Help needed climbing 3-5 steps with a railing? : A Little 6 Click Score: 16    End of Session Equipment Utilized During Treatment: Gait belt Activity Tolerance: Patient tolerated treatment well;Patient limited by pain Patient left: in bed;with call bell/phone within reach;with bed alarm set Nurse Communication: Mobility status;Other (comment)(no recliner in room) PT Visit Diagnosis: Unsteadiness on feet (R26.81);Muscle weakness (generalized) (M62.81);Dizziness and giddiness (R42)     Time: 1610-9604 PT Time Calculation (min) (ACUTE ONLY): 31 min  Charges:  $Gait Training: 8-22 mins $Therapeutic Exercise: 8-22 mins                    G Codes:  Functional Assessment Tool Used: AM-PAC 6 Clicks Basic Mobility     Ivar Drape 06/08/2017, 11:58 AM   Samul Dada, PT MS Acute Rehab Dept. Number: [facility] [id number] and [facility] [phone]

## 2017-06-08 NOTE — Evaluation (Signed)
Occupational Therapy Evaluation Patient Details Name: Dawn Clark MRN: [id number] DOB: 23-Sep-1928 Today's Date: 06/08/2017    History of Present Illness Pt is an 84 yo female diagnosed with degenerative arthrosis of the L knee and is s/p elective L TKA.   Clinical Impression   Pt seen for OT evaluation this date. Pt is independent at baseline, living alone in a single family house and family nearby. Pt presents with minimal L knee pain and impairments in LLE ROM/strength, balance, activity tolerance, and knowledge of AE/DME impacting her functional independence with ADL and mobility. Pt was modified independent with bed mobility with good safety awareness, requiring min guard for transfers with education and verbal cues for safe hand placement to improve technique and safety. Pt instructed in AE for LB dressing with pt able to return demo good technique to initiate donning of underwear over feet and requiring min guard in standing for balance while completing donning over hips with pt noted to brace BLE against bed initially. Pt will benefit from continued skilled OT services to address noted impairments and functional deficits in order to maximize return to PLOF and minimize risk of falls. Recommend STR following hospitalization.     Follow Up Recommendations  SNF    Equipment Recommendations  Other (comment)(TBD at next venue of care)    Recommendations for Other Services       Precautions / Restrictions Precautions Precautions: Fall;Knee Precaution Booklet Issued: No Restrictions Weight Bearing Restrictions: Yes LLE Weight Bearing: Weight bearing as tolerated      Mobility Bed Mobility Overal bed mobility: Modified Independent Bed Mobility: Supine to Sit;Sit to Supine     Supine to sit: Modified independent (Device/Increase time) Sit to supine: Modified independent (Device/Increase time)   General bed mobility comments: Increased time and effort required during bed  mobility tasks but no physical assistance  Transfers Overall transfer level: Needs assistance Equipment used: Rolling walker (2 wheeled) Transfers: Sit to/from Stand Sit to Stand: Min guard         General transfer comment: verbal cues for hand placement    Balance Overall balance assessment: Needs assistance Sitting-balance support: Feet supported;No upper extremity supported;Bilateral upper extremity supported;Feet unsupported Sitting balance-Leahy Scale: Good     Standing balance support: Bilateral upper extremity supported Standing balance-Leahy Scale: Fair                             ADL either performed or assessed with clinical judgement   ADL Overall ADL's : Needs assistance/impaired Eating/Feeding: Sitting;Independent   Grooming: Sitting;Independent   Upper Body Bathing: Sitting;Set up;Supervision/ safety   Lower Body Bathing: Sit to/from stand;Minimal assistance Lower Body Bathing Details (indicate cue type and reason): pt educated in need for sponge bathing while restricted from bathing per surgeon Upper Body Dressing : Supervision/safety;Sitting;Set up   Lower Body Dressing: Sit to/from stand;Min guard;Set up;With adaptive equipment Lower Body Dressing Details (indicate cue type and reason): pt instructed in LB Dressing using AE with pt able to return demo proper technique with supervision to thread underwear over feet and min guard in standing to complete donning over hips Toilet Transfer: RW;Min guard;Ambulation;Comfort height toilet         Tub/Shower Transfer Details (indicate cue type and reason): educated in benefits and positioning of grab bars for tub transfers Functional mobility during ADLs: Min guard;Rolling walker       Vision Baseline Vision/History: Wears glasses Wears Glasses: At all times Patient  Visual Report: No change from baseline       Perception     Praxis      Pertinent Vitals/Pain Pain Assessment: 0-10 Pain  Score: 2  Pain Location: L knee Pain Descriptors / Indicators: Aching;Sore;Operative site guarding Pain Intervention(s): Limited activity within patient's tolerance;Monitored during session;RN gave pain meds during session;Ice applied     Hand Dominance Right   Extremity/Trunk Assessment Upper Extremity Assessment Upper Extremity Assessment: Overall WFL for tasks assessed   Lower Extremity Assessment Lower Extremity Assessment: Defer to PT evaluation;LLE deficits/detail   Cervical / Trunk Assessment Cervical / Trunk Assessment: Normal   Communication Communication Communication: No difficulties   Cognition Arousal/Alertness: Awake/alert Behavior During Therapy: WFL for tasks assessed/performed Overall Cognitive Status: Within Functional Limits for tasks assessed                                     General Comments       Exercises     Shoulder Instructions      Home Living Family/patient expects to be discharged to:: Private residence Living Arrangements: Alone Available Help at Discharge: Family;Available PRN/intermittently Type of Home: House Home Access: Stairs to enter [employer] of Steps: 4 stairs from garage with R handrail; 4 steps from den with bilat hand rails (can't reach both at the same time) Entrance Stairs-Rails: Right;Left Home Layout: One level     Bathroom Shower/Tub: Chief Strategy Officer: Handicapped height     Home Equipment: Environmental consultant - 2 wheels;Walker - 4 wheels;Other (comment);Adaptive equipment Adaptive Equipment: Reacher Additional Comments: Pt has life alert; ADs above are on loan from a friend but pt has access to them as long as needed      Prior Functioning/Environment          Comments: Pt Ind with amb without AD community distances without AD, no fall history, Ind with ADLs. Pt primarily takes tub baths.        OT Problem List: Decreased strength;Decreased range of motion;Decreased  knowledge of use of DME or AE;Decreased activity tolerance;Impaired balance (sitting and/or standing)      OT Treatment/Interventions: Self-care/ADL training;Balance training;Therapeutic exercise;Therapeutic activities;DME and/or AE instruction;Patient/family education    OT Goals(Current goals can be found in the care plan section) Acute Rehab OT Goals Patient Stated Goal: go to rehab and get stronger OT Goal Formulation: With patient Time For Goal Achievement: 06/22/17 Potential to Achieve Goals: Good ADL Goals Pt Will Perform Lower Body Dressing: with supervision;sit to/from stand;with adaptive equipment Pt Will Transfer to Toilet: with supervision;ambulating(LRAD for amb, comfort height toilet, safe hand placement)  OT Frequency: Min 2X/week   Barriers to D/C: Decreased caregiver support          Co-evaluation              AM-PAC PT "6 Clicks" Daily Activity     Outcome Measure Help from another person eating meals?: None Help from another person taking care of personal grooming?: None Help from another person toileting, which includes using toliet, bedpan, or urinal?: A Little Help from another person bathing (including washing, rinsing, drying)?: A Little Help from another person to put on and taking off regular upper body clothing?: A Little Help from another person to put on and taking off regular lower body clothing?: A Little 6 Click Score: 20   End of Session Equipment Utilized During Treatment: Gait belt;Rolling walker Nurse Communication:  Other (comment)(need recliner for room)  Activity Tolerance: Patient tolerated treatment well Patient left: in bed;with call bell/phone within reach;with bed alarm set;with SCD's reapplied  OT Visit Diagnosis: Other abnormalities of gait and mobility (R26.89);Muscle weakness (generalized) (M62.81)                Time: [id number] OT Time Calculation (min): 31 min Charges:  OT General Charges $OT Visit: 1 Visit OT  Evaluation $OT Eval Low Complexity: 1 Low OT Treatments $Self Care/Home Management : 23-37 mins  Richrd PrimeJamie Stiller, MPH, MS, OTR/L ascom [phone] 06/08/17, 9:43 AM

## 2017-06-08 NOTE — Discharge Summary (Signed)
Physician Discharge Summary  Patient ID: Dawn Clark MRN: [id number] DOB/AGE: May 15, 1928 84 y.o.  Admit date: 06/07/2017 Discharge date: 06/09/2017  Admission Diagnoses:  PRIMARY OSTEOARTHRITIS OF LEFT KNEE   Discharge Diagnoses: Patient Active Problem List   Diagnosis Date Noted  . S/P total knee arthroplasty 06/07/2017  . Primary osteoarthritis of left knee 03/14/2017    Past Medical History:  Diagnosis Date  . Arthritis   . Osteoporosis   . Vertigo    x1 over 2 yrs     Transfusion: No transfusions on this admission   Consultants (if any):   Discharged Condition: Improved  Hospital Course: Dawn Clark is an 84 y.o. female who was admitted 06/07/2017 with a diagnosis of degenerative arthrosis left knee and went to the operating room on 06/07/2017 and underwent the above named procedures.    Surgeries:Procedure(s): COMPUTER ASSISTED TOTAL KNEE ARTHROPLASTY on 06/07/2017  PRE-OPERATIVE DIAGNOSIS: Degenerative arthrosis of the left knee, primary  POST-OPERATIVE DIAGNOSIS:  Same  PROCEDURE:  Left total knee arthroplasty using computer-assisted navigation  SURGEON:  Jena Gauss. M.D.  ASSISTANT:  Van Clines, PA (present and scrubbed throughout the case, critical for assistance with exposure, retraction, instrumentation, and closure)  ANESTHESIA: spinal  ESTIMATED BLOOD LOSS: 50 mL  FLUIDS REPLACED: 1400 mL of crystalloid  TOURNIQUET TIME: 74 minutes  DRAINS: 2 medium Hemovac drains  SOFT TISSUE RELEASES: Anterior cruciate ligament, posterior cruciate ligament, deep medial collateral ligament, patellofemoral ligament  IMPLANTS UTILIZED: DePuy Attune size 4N posterior stabilized femoral component (cemented), size 4 rotating platform tibial component (cemented), 35 mm medialized dome patella (cemented), and a 5 mm stabilized rotating platform polyethylene insert.  INDICATIONS FOR SURGERY: Dawn Clark is a 84 y.o. year old female with a long  history of progressive knee pain. X-rays demonstrated severe degenerative changes in tricompartmental fashion. The patient had not seen any significant improvement despite conservative nonsurgical intervention. After discussion of the risks and benefits of surgical intervention, the patient expressed understanding of the risks benefits and agree with plans for total knee arthroplasty.   The risks, benefits, and alternatives were discussed at length including but not limited to the risks of infection, bleeding, nerve injury, stiffness, blood clots, the need for revision surgery, cardiopulmonary complications, among others, and they were willing to proceed.   Patient tolerated the surgery well. No complications .Patient was taken to PACU where she was stabilized and then transferred to the orthopedic floor.  Patient started on Lovenox 30 mg q 12 hrs. Foot pumps applied bilaterally at 80 mm hgb. Heels elevated off bed with rolled towels. No evidence of DVT. Calves non tender. Negative Homan. Physical therapy started on day #1 for gait training and transfer with OT starting on  day #1 for ADL and assisted devices. Patient has done well with therapy. Ambulated 15 feet upon being discharged.  Patient's IV And Foley were discontinued on day #1 with Hemovac being discontinued on day #2. Dressing was changed on day 2 prior to patient being discharged   She was given perioperative antibiotics:  Anti-infectives (From admission, onward)   Start     Dose/Rate Route Frequency Ordered Stop   06/07/17 1200  ceFAZolin (ANCEF) IVPB 2g/100 mL premix     2 g 200 mL/hr over 30 Minutes Intravenous Every 6 hours 06/07/17 1137 06/08/17 0701   06/07/17 0600  ceFAZolin (ANCEF) IVPB 2g/100 mL premix     2 g 200 mL/hr over 30 Minutes Intravenous On call to O.R. 06/06/17 2204  06/07/17 0804   06/07/17 0557  ceFAZolin (ANCEF) 2-4 GM/100ML-% IVPB    Comments:  Vanessa Ralphsollins, Erica   : cabinet override      06/07/17 0557 06/07/17  0752    .  She was fitted with AV 1 compression foot pump devices, instructed on heel pumps, early ambulation, and fitted with TED stockings bilaterally for DVT prophylaxis.  She benefited maximally from the hospital stay and there were no complications.    Recent vital signs:  Vitals:   06/08/17 0041 06/08/17 0438  BP: (!) 107/50 (!) 101/52  Pulse: 85 71  Resp:  16  Temp:  (!) 97.5 F (36.4 C)  SpO2: 98% 100%    Recent laboratory studies:  Lab Results  Component Value Date   HGB 15.4 05/26/2017   Lab Results  Component Value Date   WBC 7.3 05/26/2017   PLT 217 05/26/2017   Lab Results  Component Value Date   INR 0.90 05/26/2017   Lab Results  Component Value Date   NA 140 05/26/2017   K 3.5 05/26/2017   CL 101 05/26/2017   CO2 31 05/26/2017   BUN 21 (H) 05/26/2017   CREATININE 0.95 05/26/2017   GLUCOSE 87 05/26/2017    Discharge Medications:   Allergies as of 06/08/2017   No Known Allergies     Medication List    TAKE these medications   alendronate 70 MG tablet Commonly known as:  FOSAMAX Take 70 mg by mouth every Monday. In the morning.Take with a full glass of water on an empty stomach.   aspirin EC 81 MG tablet Take 81 mg by mouth daily. Notes to patient:  May begin taking aspirin once again after she has finished her Lovenox injections   CALCIUM 600+D3 PO Take 1 tablet by mouth daily.   enoxaparin 30 MG/0.3ML injection Commonly known as:  LOVENOX Inject 0.3 mLs (30 mg total) into the skin every 12 (twelve) hours for 12 days. Start taking on:  06/10/2017   meloxicam 15 MG tablet Commonly known as:  MOBIC Take 15 mg by mouth daily.   multivitamin with minerals Tabs tablet Take 1 tablet by mouth daily. Centrum Silver Multivitamin   oxyCODONE 5 MG immediate release tablet Commonly known as:  Oxy IR/ROXICODONE Take 1 tablet (5 mg total) by mouth every 3 (three) hours as needed for moderate pain ((score 4 to 6)).   traMADol 50 MG  tablet Commonly known as:  ULTRAM Take 1-2 tablets (50-100 mg total) by mouth every 4 (four) hours as needed for moderate pain.            Durable Medical Equipment  (From admission, onward)        Start     Ordered   06/07/17 1137  DME Walker rolling  Once    Question:  Patient needs a walker to treat with the following condition  Answer:  Total knee replacement status   06/07/17 1137   06/07/17 1137  DME Bedside commode  Once    Question:  Patient needs a bedside commode to treat with the following condition  Answer:  Total knee replacement status   06/07/17 1137      Diagnostic Studies: Dg Knee Left Port  Result Date: 06/07/2017 CLINICAL DATA:  Status post left total knee joint prosthesis placement. EXAM: PORTABLE LEFT KNEE - 1-2 VIEW COMPARISON:  Preoperative study of November 03, 2016 FINDINGS: The patient has undergone total knee joint prosthesis placement. Radiographic positioning of the prosthetic components  is good. The interface with the native bone is normal. There surgical drain lines and skin staples present. IMPRESSION: There is no immediate postprocedure complication following left knee joint prosthesis placement. Electronically Signed   By: David  [place] M.D.   On: 06/07/2017 10:52    Disposition: 01-Home or Self Care  Discharge Instructions    Increase activity slowly   Complete by:  As directed       Follow-up Information    Tera Partridge, PA On 06/22/2017.   Specialty:  Physician Assistant Why:  at 1:15pm Contact information: [address] [facility] [place] [address] [phone]        Donato Heinz, MD On 07/20/2017.   Specialty:  Orthopedic Surgery Why:  at 10:45am Contact information: [address] [facility] [place] [address] [phone]            Signed: Tera Partridge. 06/08/2017, 7:27 AM

## 2017-06-08 NOTE — Progress Notes (Signed)
Physical Therapy Treatment Patient Details Name: Dawn Clark MRN: [id number] DOB: 10-24-1928 Today's Date: 06/08/2017    History of Present Illness Pt is an 84 yo female diagnosed with degenerative arthrosis of the L knee and is s/p elective L TKA.    PT Comments    Pt was up to walk this afternoon after getting up with less assistance, and is very motivated to move.  Nursing obtained a recliner to sit up and pt was positioned there after doing her LE strengthening exercises on LLE.  Her ice was replenished in polar care and she reports a lower pain rating this afternoon.  Follow BID as tolerated for progression of mobility and safety with pain management to L knee.   Follow Up Recommendations  SNF     Equipment Recommendations  None recommended by PT    Recommendations for Other Services       Precautions / Restrictions Precautions Precautions: Fall;Knee Precaution Booklet Issued: No Restrictions Weight Bearing Restrictions: Yes LLE Weight Bearing: Weight bearing as tolerated    Mobility  Bed Mobility Overal bed mobility: Modified Independent Bed Mobility: Supine to Sit     Supine to sit: Modified independent (Device/Increase time)        Transfers Overall transfer level: Needs assistance Equipment used: Rolling walker (2 wheeled) Transfers: Sit to/from Stand Sit to Stand: Min guard         General transfer comment: reminders for sequence and safety  Ambulation/Gait Ambulation/Gait assistance: Min guard Ambulation Distance (Feet): 10 Feet Assistive device: Rolling walker (2 wheeled) Gait Pattern/deviations: Step-to pattern;Decreased stride length;Antalgic;Wide base of support;Trunk flexed Gait velocity: reduced Gait velocity interpretation: Below normal speed for age/gender General Gait Details: pain is better at 3/10   Stairs            Wheelchair Mobility    Modified Rankin (Stroke Patients Only)       Balance Overall balance  assessment: Needs assistance Sitting-balance support: Feet supported;Single extremity supported Sitting balance-Leahy Scale: Good     Standing balance support: Bilateral upper extremity supported;During functional activity Standing balance-Leahy Scale: Fair                              Cognition Arousal/Alertness: Awake/alert Behavior During Therapy: WFL for tasks assessed/performed Overall Cognitive Status: Within Functional Limits for tasks assessed                                        Exercises Total Joint Exercises Ankle Circles/Pumps: AROM;Both;10 reps Heel Slides: AAROM;Left;10 reps Long Arc Quad: AROM;AAROM;Left;10 reps Goniometric ROM: -6 deg ext    General Comments        Pertinent Vitals/Pain Pain Location: L knee Pain Descriptors / Indicators: Aching;Sore;Operative site guarding Pain Intervention(s): Limited activity within patient's tolerance;Premedicated before session;Ice applied    Home Living                      Prior Function            PT Goals (current goals can now be found in the care plan section) Acute Rehab PT Goals Patient Stated Goal: go to rehab and get stronger Progress towards PT goals: Progressing toward goals    Frequency    BID      PT Plan Current plan remains appropriate    Co-evaluation  AM-PAC PT "6 Clicks" Daily Activity  Outcome Measure  Difficulty turning over in bed (including adjusting bedclothes, sheets and blankets)?: A Little Difficulty moving from lying on back to sitting on the side of the bed? : A Little Difficulty sitting down on and standing up from a chair with arms (e.g., wheelchair, bedside commode, etc,.)?: Unable Help needed moving to and from a bed to chair (including a wheelchair)?: A Little Help needed walking in hospital room?: A Little Help needed climbing 3-5 steps with a railing? : A Little 6 Click Score: 16    End of Session  Equipment Utilized During Treatment: Gait belt Activity Tolerance: Patient tolerated treatment well;Patient limited by pain Patient left: in bed;with call bell/phone within reach;with bed alarm set Nurse Communication: Mobility status;Other (comment)(no recliner in room) PT Visit Diagnosis: Unsteadiness on feet (R26.81);Muscle weakness (generalized) (M62.81);Dizziness and giddiness (R42)     Time: 4098-11911430-1459 PT Time Calculation (min) (ACUTE ONLY): 29 min  Charges:  $Gait Training: 8-22 mins $Therapeutic Exercise: 8-22 mins                    G Codes:  Functional Assessment Tool Used: AM-PAC 6 Clicks Basic Mobility     Ivar DrapeRuth E Karim Aiello 06/08/2017, 4:41 PM   Samul Dadauth Kili Gracy, PT MS Acute Rehab Dept. Number: [facility] [phone] and [facility] [phone]

## 2017-06-08 NOTE — Care Management (Signed)
Patient to discharge to SNF.  CSW facilitating.  RNCM signing off.  Please consult if indicated

## 2017-06-08 NOTE — Clinical Social Work Placement (Addendum)
   CLINICAL SOCIAL WORK PLACEMENT  NOTE  Date:  06/08/2017  Patient Details  Name: Dawn Clark MRN: [id number] Date of Birth: Sep 27, 1928  Clinical Social Work is seeking post-discharge placement for this patient at the Skilled  Nursing Facility level of care (*CSW will initial, date and re-position this form in  chart as items are completed):  Yes   Patient/family provided with [facility] Clinical Social Work Department's list of facilities offering this level of care within the geographic area requested by the patient (or if unable, by the patient's family).  Yes   Patient/family informed of their freedom to choose among providers that offer the needed level of care, that participate in Medicare, Medicaid or managed care program needed by the patient, have an available bed and are willing to accept the patient.  Yes   Patient/family informed of [facility]'s ownership interest in [facility] and [facility], as well as of the fact that they are under no obligation to receive care at these facilities.  PASRR submitted to EDS on 06/07/17     PASRR number received on 06/07/17     Existing PASRR number confirmed on       FL2 transmitted to all facilities in geographic area requested by pt/family on 06/07/17     FL2 transmitted to all facilities within larger geographic area on       Patient informed that his/her managed care company has contracts with or will negotiate with certain facilities, including the following:        Yes   Patient/family informed of bed offers received.  Patient chooses bed at [facility]     Physician recommends and patient chooses bed at      Patient to be [facility]  [facility] on 06-09-17.  Patient to be transferred to facility by  [facility],      Patient family notified on  06-09-17 of transfer.  Name of family member notified:   Patient's son Dawn Clark [phone].    PHYSICIAN       Additional Comment:     _______________________________________________ Sample, Darleen CrockerBailey M, LCSW 06/08/2017, 8:46 AM  Updated Ervin KnackEric R. Tayva Easterday, MSW, LCSWA [phone]  06/09/2017 9:00 AM

## 2017-06-09 NOTE — Progress Notes (Signed)
[facility]. IV removed with cath intact. Report called. Script and extra dressing sent with patient in packet. Belongings sent with daughter.

## 2017-06-09 NOTE — Progress Notes (Signed)
   Subjective: 2 Days Post-Op Procedure(s) (LRB): COMPUTER ASSISTED TOTAL KNEE ARTHROPLASTY (Left) Patient reports pain as 3 on 0-10 scale.   Patient is well, and has had no acute complaints or problems Patient was able to ambulate 10 feet yesterday with range of motion of greater than 90 degrees. Plan is to go Rehab after hospital stay. no nausea and no vomiting Patient denies any chest pains or shortness of breath. Patient continues to sleep very well and having no problems. Voicing no new complaints Bone foam has been off the leg during the night.  Objective: Vital signs in last 24 hours: Temp:  [97.8 F (36.6 C)-99 F (37.2 C)] 99 F (37.2 C) (02/06 0504) Pulse Rate:  [75-83] 82 (02/06 0504) Resp:  [15-16] 15 (02/06 0504) BP: (113-142)/(49-64) 113/52 (02/06 0504) SpO2:  [94 %-100 %] 94 % (02/06 0504) well approximated incision Heels are non tender and elevated off the bed using rolled towels Intake/Output from previous day: 02/05 0701 - 02/06 0700 In: 3546.7 [P.O.:720; I.V.:2826.7] Out: 780 [Urine:650; Drains:130] Intake/Output this shift: Total I/O In: 240 [P.O.:240] Out: 20 [Drains:20]  No results for input(s): HGB in the last 72 hours. No results for input(s): WBC, RBC, HCT, PLT in the last 72 hours. No results for input(s): NA, K, CL, CO2, BUN, CREATININE, GLUCOSE, CALCIUM in the last 72 hours. No results for input(s): LABPT, INR in the last 72 hours.  EXAM General - Patient is Alert, Appropriate and Oriented Extremity - Neurologically intact Neurovascular intact Sensation intact distally Intact pulses distally Dorsiflexion/Plantar flexion intact No cellulitis present Compartment soft Dressing - scant drainage Motor Function - intact, moving foot and toes well on exam.    Past Medical History:  Diagnosis Date  . Arthritis   . Osteoporosis   . Vertigo    x1 over 2 yrs    Assessment/Plan: 2 Days Post-Op Procedure(s) (LRB): COMPUTER ASSISTED TOTAL  KNEE ARTHROPLASTY (Left) Active Problems:   S/P total knee arthroplasty  Estimated body mass index is 26.16 kg/m as calculated from the following:   Height as of this encounter: 5\' 2"  (1.575 m).   Weight as of this encounter: 64.9 kg (143 lb). Up with therapy Discharge to SNF  Labs: None DVT Prophylaxis - Lovenox, Foot Pumps and TED hose Weight-Bearing as tolerated to left leg Hemovac was discontinued on today's visit.  The end of the drains appeared to be intact. Please wash operative leg and apply TED stockings to both legs Please change dressing to the operative leg prior to discharge Patient needs a bowel movement.  Dawn ShieldsJon R. Clark Surgery Center[facility] PA [facility] 06/09/2017, 6:45 AM

## 2017-06-09 NOTE — Clinical Social Work Note (Addendum)
Patient to be d/c'ed today to [facility]  Patient and family agreeable to plans will transport via ems RN to call report to room 207A [phone].  CSW updated patient's son Alinda Moneyony at [phone] that she will be discharging today.   Windell MouldingEric Lucile Hillmann, MSW, Theresia MajorsLCSWA [phone]

## 2017-06-09 NOTE — Care Management Important Message (Signed)
Important Message  Patient Details  Name: Dawn PeoplesRuby Lea Burnette MRN: [id number] Date of Birth: Nov 01, 1928   Medicare Important Message Given:  N/A - LOS <3 / Initial given by admissions    Chapman FitchBOWEN, Rainey Kahrs T, RN 06/09/2017, 10:11 AM

## 2017-06-09 NOTE — Progress Notes (Signed)
Physical Therapy Treatment Patient Details Name: Dawn Clark MRN: [id number] DOB: 07/25/1928 Today's Date: 06/09/2017    History of Present Illness Pt is an 84 yo female diagnosed with degenerative arthrosis of the L knee and is s/p elective L TKA.    PT Comments    Pt continues to make excellent progress with therapy. She is able to significantly increase her ambulation distance with improved sequencing with walker. She is able to complete all seated and supine exercises as instructed. Very mild extensor lag noted on LLE during seated LAQ. No assist required for SLR and no extensor lag noted. Pt is safe for discharge when medically appropriate to go to SNF. Pt will benefit from PT services to address deficits in strength, balance, and mobility in order to return to full function at home.     Follow Up Recommendations  SNF     Equipment Recommendations  None recommended by PT    Recommendations for Other Services       Precautions / Restrictions Precautions Precautions: Fall;Knee Precaution Booklet Issued: No Required Braces or Orthoses: Knee Immobilizer - Left Knee Immobilizer - Left: Discontinue once straight leg raise with < 10 degree lag Restrictions Weight Bearing Restrictions: Yes LLE Weight Bearing: Weight bearing as tolerated    Mobility  Bed Mobility Overal bed mobility: Modified Independent Bed Mobility: Supine to Sit     Supine to sit: Modified independent (Device/Increase time)     General bed mobility comments: Good speed/sequencing. HOB elevated and use of bed rails  Transfers Overall transfer level: Needs assistance Equipment used: Rolling walker (2 wheeled) Transfers: Sit to/from Stand Sit to Stand: Min guard         General transfer comment: Improved sequencing with transfers. Pt requires cues for safe hand placement during both phases of transfer.  Ambulation/Gait Ambulation/Gait assistance: Min guard Ambulation Distance (Feet): 40  Feet Assistive device: Rolling walker (2 wheeled) Gait Pattern/deviations: Step-to pattern;Decreased stride length;Antalgic;Wide base of support;Trunk flexed Gait velocity: Decreased Gait velocity interpretation: <1.8 ft/sec, indicative of risk for recurrent falls General Gait Details: Improving ambulation with therapy today. Cues for sequencing and R step length. Pt denies DOE with ambulation. No reported increase in pain    Stairs            Wheelchair Mobility    Modified Rankin (Stroke Patients Only)       Balance Overall balance assessment: Needs assistance Sitting-balance support: Feet supported;Single extremity supported Sitting balance-Leahy Scale: Good     Standing balance support: Bilateral upper extremity supported;During functional activity Standing balance-Leahy Scale: Fair Standing balance comment: Decreased UE use on walker this session                            Cognition Arousal/Alertness: Awake/alert Behavior During Therapy: WFL for tasks assessed/performed Overall Cognitive Status: Within Functional Limits for tasks assessed                                        Exercises Total Joint Exercises Ankle Circles/Pumps: AROM;Both;10 reps Quad Sets: Both;10 reps;Strengthening Gluteal Sets: Both;10 reps;Strengthening Hip ABduction/ADduction: Strengthening;Left;10 reps;Other (comment)(seated clams and seated adduction) Straight Leg Raises: 10 reps;Strengthening;Left Long Arc Quad: Strengthening;Left;10 reps Knee Flexion: Strengthening;Left;10 reps Goniometric ROM: -3 to 103 degrees AAROM, pain limited in both flex/ext Marching in Standing: Strengthening;Both;10 reps;Seated    General Comments  Pertinent Vitals/Pain Pain Assessment: No/denies pain Pain Location: Denies L knee pain upon arrival today    Home Living                      Prior Function            PT Goals (current goals can now be  found in the care plan section) Acute Rehab PT Goals Patient Stated Goal: Go to rehab and get stronger PT Goal Formulation: With patient Time For Goal Achievement: 06/20/17 Potential to Achieve Goals: Good Progress towards PT goals: Progressing toward goals    Frequency    BID      PT Plan Current plan remains appropriate    Co-evaluation              AM-PAC PT "6 Clicks" Daily Activity  Outcome Measure  Difficulty turning over in bed (including adjusting bedclothes, sheets and blankets)?: A Little Difficulty moving from lying on back to sitting on the side of the bed? : A Little Difficulty sitting down on and standing up from a chair with arms (e.g., wheelchair, bedside commode, etc,.)?: A Little Help needed moving to and from a bed to chair (including a wheelchair)?: A Little Help needed walking in hospital room?: A Little Help needed climbing 3-5 steps with a railing? : A Lot 6 Click Score: 17    End of Session Equipment Utilized During Treatment: Gait belt Activity Tolerance: Patient tolerated treatment well Patient left: with call bell/phone within reach;in chair;with chair alarm set;with SCD's reapplied;Other (comment)(towel roll under heel, polar care in place) Nurse Communication: Mobility status;Other (comment)(ready for discharge) PT Visit Diagnosis: Unsteadiness on feet (R26.81);Muscle weakness (generalized) (M62.81);Dizziness and giddiness (R42)     Time: 5621-30860912-0937 PT Time Calculation (min) (ACUTE ONLY): 25 min  Charges:  $Gait Training: 8-22 mins $Therapeutic Exercise: 8-22 mins                    G Codes:       Sharalyn InkJason D Caiya Bettes PT, DPT     France Lusty 06/09/2017, 9:59 AM

## 2017-06-10 ENCOUNTER — Other Ambulatory Visit: Payer: Self-pay

## 2017-06-10 DIAGNOSIS — M810 Age-related osteoporosis without current pathological fracture: Secondary | ICD-10-CM | POA: Insufficient documentation

## 2017-06-10 MED ORDER — TRAMADOL HCL 50 MG PO TABS: 50.0000 mg | ORAL_TABLET | ORAL | 0 refills | Status: DC | PRN

## 2017-06-10 NOTE — Telephone Encounter (Signed)
Rx sent to [facility] phone : [phone] , fax : [phone]  

## 2017-06-14 ENCOUNTER — Non-Acute Institutional Stay (SKILLED_NURSING_FACILITY): Payer: Medicare PPO | Admitting: Gerontology

## 2017-06-14 DIAGNOSIS — M79672 Pain in left foot: Secondary | ICD-10-CM

## 2017-06-14 DIAGNOSIS — M1712 Unilateral primary osteoarthritis, left knee: Secondary | ICD-10-CM

## 2017-06-14 DIAGNOSIS — Z96652 Presence of left artificial knee joint: Secondary | ICD-10-CM | POA: Diagnosis not present

## 2017-06-14 DIAGNOSIS — M79662 Pain in left lower leg: Secondary | ICD-10-CM

## 2017-06-16 ENCOUNTER — Encounter: Payer: Self-pay | Admitting: Gerontology

## 2017-06-16 NOTE — Progress Notes (Signed)
[facility]:   [facility] Room Number: 207A Place of Service:  SNF [phone]) Provider:  Lorenso Quarry, NP-C  Jaclyn Shaggy, MD  Patient Care Team: Jaclyn Shaggy, MD as PCP - General (Internal Medicine)  Extended Emergency Contact Information Primary Emergency Contact: Christella Hartigan Address: [address] [facility] [address]          [place], [place] [address] Darden [place] of [place] Home Phone: [phone] Mobile Phone: [phone] Relation: Son Secondary Emergency Contact: Reed Pandy [place] Home Phone: [phone] Relation: Relative  Code Status:  FULL Goals of care: Advanced Directive information Advanced Directives 06/16/2017  Does Patient Have a Medical Advance Directive? No  Type of Advance Directive -  Does patient want to make changes to medical advance directive? -  Copy of Healthcare Power of Attorney in Chart? -     Chief Complaint  Patient presents with  . Acute Visit    Recheck leg pain    HPI:  Pt is a 84 y.o. female seen today for an acute visit for complaint of foot and calf pain.  Patient was admitted to the facility for rehab following hospitalization at [facility] for left total knee replacement.  Patient has been participating in PT/OT.  Today, when up with therapy, patient complained of sudden onset left calf pain as well as pain in the left dorsal foot.  Bilateral calves are soft and supple.  Negative Homans sign.  Bilateral pedal pulses equal and strong.  Patient complains of pain in the calf as a stretching feeling.  Patient complaining of pain in the dorsal foot with light palpation.  She describes the pain as feeling "bruised."  No visible signs of bruising, redness or other skin irritation.  No warmth or other signs of cellulitis.  No obvious calf redness.  Patient denies chest pain or shortness of breath.  Otherwise, patient reports pain is well controlled on current regimen.  Patient is on Lovenox 30 mg twice daily for DVT  prophylaxis.  Incision CDI, well approximated.  No redness or drainage.  No signs of infection.  Vital signs stable.  No other complaints.    Past Medical History:  Diagnosis Date  . Arthritis   . Chicken pox   . Osteoporosis   . Vertigo    x1 over 2 yrs   Past Surgical History:  Procedure Laterality Date  . ABDOMINAL HYSTERECTOMY    . 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 CATARACT EXTRACTION W/PHACO Left 07/15/2016   Procedure: CATARACT EXTRACTION PHACO AND INTRAOCULAR LENS PLACEMENT (IOC);  Surgeon: Lockie Mola, MD;  Location: [facility];  Service: Ophthalmology;  Laterality: Left;  left  . KNEE ARTHROPLASTY Left 06/07/2017   Procedure: COMPUTER ASSISTED TOTAL KNEE ARTHROPLASTY;  Surgeon: Donato Heinz, MD;  Location: [facility] ORS;  Service: Orthopedics;  Laterality: Left;  . WISDOM TOOTH EXTRACTION      No Known Allergies  Allergies as of 06/14/2017   No Known Allergies     Medication List        Accurate as of 06/14/17 11:59 PM. Always use your most recent med list.          acetaminophen 325 MG tablet Commonly known as:  TYLENOL Take 650 mg by mouth every 6 (six)  hours as needed.   alendronate 70 MG tablet Commonly known as:  FOSAMAX Take 70 mg by mouth every Monday. In the morning.Take with a full glass of water on an empty stomach.   aspirin EC 81 MG tablet Take 81 mg by mouth daily.   CALCIUM 600+D3 PO Take 1 tablet by mouth daily.   CENTRUM SILVER tablet Take 1 tablet by mouth daily.   enoxaparin 30 MG/0.3ML injection Commonly known as:  LOVENOX Inject 0.3 mLs (30 mg total) into the skin every 12 (twelve) hours for 12 days.   oxyCODONE 5 MG immediate release tablet Commonly known as:  Oxy  IR/ROXICODONE Take 1 tablet (5 mg total) by mouth every 3 (three) hours as needed for moderate pain ((score 4 to 6)).   traMADol 50 MG tablet Commonly known as:  ULTRAM Take 1-2 tablets (50-100 mg total) by mouth every 4 (four) hours as needed for moderate pain.       Review of Systems  Constitutional: Negative for activity change, appetite change, chills, diaphoresis and fever.  HENT: Negative for congestion, mouth sores, nosebleeds, postnasal drip, sneezing, sore throat, trouble swallowing and voice change.   Respiratory: Negative for apnea, cough, choking, chest tightness, shortness of breath and wheezing.   Cardiovascular: Negative for chest pain, palpitations and leg swelling.  Gastrointestinal: Negative for abdominal distention, abdominal pain, constipation, diarrhea and nausea.  Genitourinary: Negative for difficulty urinating, dysuria, frequency and urgency.  Musculoskeletal: Positive for arthralgias (typical arthritis), gait problem and myalgias. Negative for back pain.  Skin: Positive for wound. Negative for color change, pallor and rash.  Neurological: Negative for dizziness, tremors, syncope, speech difficulty, weakness, numbness and headaches.  Psychiatric/Behavioral: Negative for agitation and behavioral problems.  All other systems reviewed and are negative.   Immunization History  Administered Date(s) Administered  . Tdap 10/11/2016   Pertinent  Health Maintenance Due  Topic Date Due  . PNA vac Low Risk Adult (1 of 2 - PCV13) 12/17/1995  . INFLUENZA VACCINE  12/02/2016  . DEXA SCAN  Completed   No flowsheet data found. Functional Status Survey:    Vitals:   06/14/17 1545  BP: 125/66  Pulse: 74  Resp: 18  Temp: 97.6 F (36.4 C)  TempSrc: Oral  SpO2: 97%  Weight: 145 lb (65.8 kg)  Height: 5\' 2"  (1.575 m)   Body mass index is 26.52 kg/m. Physical Exam  Constitutional: She is oriented to person, place, and time. Vital signs are normal. She appears  well-developed and well-nourished. She is active and cooperative. She does not appear ill. No distress.  HENT:  Head: Normocephalic and atraumatic.  Mouth/Throat: Uvula is midline, oropharynx is clear and moist and mucous membranes are normal. Mucous membranes are not pale, not dry and not cyanotic.  Eyes: Pupils are equal, round, and reactive to light. Conjunctivae, EOM and lids are normal.  Neck: Trachea normal, normal range of motion and full passive range of motion without pain. Neck supple. No JVD present. No tracheal deviation, no edema and no erythema present. No thyromegaly present.  Cardiovascular: Normal rate, regular rhythm, normal heart sounds, intact distal pulses and normal pulses. Exam reveals no gallop, no distant heart sounds and no friction rub.  No murmur heard. Pulses:      Dorsalis pedis pulses are 2+ on the right side, and 2+ on the left side.  No edema  Pulmonary/Chest: Effort normal and breath sounds normal. No accessory muscle usage. No respiratory distress. She has no decreased breath sounds.  She has no wheezes. She has no rhonchi. She has no rales. She exhibits no tenderness.  Abdominal: Soft. Normal appearance and bowel sounds are normal. She exhibits no distension and no ascites. There is no tenderness.  Musculoskeletal: She exhibits no edema.       Left knee: She exhibits decreased range of motion and laceration. Tenderness found.       Left foot: There is tenderness and bony tenderness.  Expected osteoarthritis, stiffness; Bilateral Calves soft, supple. Negative Homan's Sign. B- pedal pulses equal status post left total knee replacement;   Neurological: She is alert and oriented to person, place, and time. She has normal strength.  Skin: Skin is warm and dry. Laceration (lef tknee incision) noted. She is not diaphoretic. No cyanosis. No pallor. Nails show no clubbing.  Psychiatric: She has a normal mood and affect. Her speech is normal and behavior is normal.  Judgment and thought content normal. Cognition and memory are normal.  Nursing note and vitals reviewed.   Labs reviewed: Recent Labs    05/26/17 0957  NA 140  K 3.5  CL 101  CO2 31  GLUCOSE 87  BUN 21*  CREATININE 0.95  CALCIUM 9.5   Recent Labs    05/26/17 0957  AST 23  ALT 16  ALKPHOS 48  BILITOT 1.0  PROT 7.4  ALBUMIN 4.1   Recent Labs    05/26/17 0957  WBC 7.3  HGB 15.4  HCT 45.4  MCV 93.2  PLT 217   No results found for: TSH No results found for: HGBA1C No results found for: CHOL, HDL, LDLCALC, LDLDIRECT, TRIG, CHOLHDL  Significant Diagnostic Results in last 30 days:  Dg Knee Left Port  Result Date: 06/07/2017 CLINICAL DATA:  Status post left total knee joint prosthesis placement. EXAM: PORTABLE LEFT KNEE - 1-2 VIEW COMPARISON:  Preoperative study of November 03, 2016 FINDINGS: The patient has undergone total knee joint prosthesis placement. Radiographic positioning of the prosthetic components is good. The interface with the native bone is normal. There surgical drain lines and skin staples present. IMPRESSION: There is no immediate postprocedure complication following left knee joint prosthesis placement. Electronically Signed   By: David  [place]azilandJordan M.D.   On: 06/07/2017 10:52    Assessment/Plan Primary osteoarthritis of left knee Status post total left knee replacement  Continue working with PT/OT  Continue exercises taught by PT/OT  Continue Tylenol 650 mg p.o. every 6 hours as needed pain  Continue oxycodone 5 mg p.o. every 3 hours as needed pain  Continue tramadol 50 mg 1-2 tablets p.o. every 4 hours as needed pain  Continue Lovenox 30 mg subcu twice daily for DVT prophylaxis  Skin care per protocol  Follow-up with orthopedist as instructed  Foot pain, left  Complete view x-rays of the left ankle and foot-rule out stress fractures negative  Ice to the ankle as needed  Light Ace wrap dressing as needed for compression  Pain of left  calf  LLE duplex venous Doppler-rule out DVT negative  Add Robaxin 500 mg p.o. every 6 hours as needed pain  Polar Care/ice pack continuous  Family/ staff Communication:   Total Time:  Documentation:  Face to Face:  Family/Phone:   Labs/tests ordered: Left ankle/foot x-ray, L LE venous duplex Doppler  Medication list reviewed and assessed for continued appropriateness.  Brynda RimShannon H. Marti Acebo, NP-C Geriatrics [facility] [phone] [address] [place], [place] [address] Cell Phone (Mon-Fri 8am-5pm):  [phone] On Call:  [phone] & follow prompts after 5pm & weekends Office Phone:  [phone] Office Fax:  [phone]

## 2017-06-21 ENCOUNTER — Encounter: Payer: Self-pay | Admitting: Gerontology

## 2017-06-21 ENCOUNTER — Non-Acute Institutional Stay (SKILLED_NURSING_FACILITY): Payer: Medicare PPO | Admitting: Gerontology

## 2017-06-21 DIAGNOSIS — Z96652 Presence of left artificial knee joint: Secondary | ICD-10-CM

## 2017-06-21 DIAGNOSIS — M1712 Unilateral primary osteoarthritis, left knee: Secondary | ICD-10-CM | POA: Diagnosis not present

## 2017-06-21 DIAGNOSIS — M79672 Pain in left foot: Secondary | ICD-10-CM

## 2017-06-21 DIAGNOSIS — M79662 Pain in left lower leg: Secondary | ICD-10-CM | POA: Diagnosis not present

## 2017-06-21 NOTE — Progress Notes (Signed)
Location[facility]   [facility] Room Number: 207A Place of Service:  SNF [phone])  Provider: Lorenso Quarry, NP-C  PCP: Jaclyn Shaggy, MD Patient Care Team: Jaclyn Shaggy, MD as PCP - General (Internal Medicine)  Extended Emergency Contact Information Primary Emergency Contact: Christella Hartigan Address: [address] [facility] [address]          [place], [place] [address] D[place] of [place] Home Phone: [phone] Mobile Phone: [phone] Relation: Son Secondary Emergency Contact: Reed [place] Home Phone: [phone] Relation: Relative  Code Status: FULL Goals of care:  Advanced Directive information Advanced Directives 06/21/2017  Does Patient Have a Medical Advance Directive? No  Type of Advance Directive -  Does patient want to make changes to medical advance directive? -  Copy of Healthcare Power of Attorney in Chart? -     No Known Allergies  Chief Complaint  Patient presents with  . Discharge Note    Discharged from Facility    HPI:  84 y.o. female seen today for discharge evaluation.  Patient was admitted to the facility for rehab status post admission to [facility] for a left total knee replacement.  Patient has been participating in PT/OT.  Patient has been progressing well.  Patient reports her pain is been well controlled at this point.  Last week, patient complaint of left calf and foot pain.  Patient reports these have since resolved with medication changes.  Incision is well approximated, CDI.  Bilateral calves soft, supple.  Negative Homans sign.  Bilateral pedal pulses equal and strong.  Patient is ambulating well with the rolling walker.  Patient reports her appetite is good, voiding well and having regular BMs.  Last BM was this morning.  Patient reports she is feeling better and ready to go home.  Vital signs stable.  No other complaints.    Past Medical History:  Diagnosis Date  . Arthritis   . Chicken pox   . Osteoporosis     . Vertigo    x1 over 2 yrs    Past Surgical History:  Procedure Laterality Date  . ABDOMINAL HYSTERECTOMY    . 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 CATARACT EXTRACTION W/PHACO Left 07/15/2016   Procedure: CATARACT EXTRACTION PHACO AND INTRAOCULAR LENS PLACEMENT (IOC);  Surgeon: Lockie Mola, MD;  Location: [facility];  Service: Ophthalmology;  Laterality: Left;  left  . KNEE ARTHROPLASTY Left 06/07/2017   Procedure: COMPUTER ASSISTED TOTAL KNEE ARTHROPLASTY;  Surgeon: Donato Heinz, MD;  Location: [facility];  Service: Orthopedics;  Laterality: Left;  . WISDOM TOOTH EXTRACTION        reports that  has never smoked. she has never used smokeless tobacco. She reports that she does not drink alcohol or use drugs. Social History   Socioeconomic History  . Marital status: Widowed    Spouse name: Not on file  . Number of children: 1  . Years of education: 33  . Highest education level: High school graduate  Social Needs  . Financial resource strain: Not on file  . Food insecurity - worry: Not on file  . Food insecurity - inability: Not on file  . Transportation needs - medical: Not on file  . Transportation needs - non-medical: Not on file  Occupational History  . Not on file  Tobacco Use  . Smoking status: Never Smoker  . Smokeless tobacco: Never Used  Substance and Sexual Activity  . Alcohol use: No  . Drug use: No  . Sexual activity: Not on file  Other Topics Concern  . Not on file  Social History Narrative  . Not on file   Functional Status Survey:    No Known Allergies  Pertinent  Health Maintenance Due  Topic Date Due  . PNA vac Low Risk Adult (1 of 2 - PCV13) 12/17/1995  . INFLUENZA VACCINE  12/02/2016  .  DEXA SCAN  Completed    Medications: Allergies as of 06/21/2017   No Known Allergies     Medication List        Accurate as of 06/21/17  3:28 PM. Always use your most recent med list.          acetaminophen 325 MG tablet Commonly known as:  TYLENOL Take 650 mg by mouth every 6 (six) hours as needed.   alendronate 70 MG tablet Commonly known as:  FOSAMAX Take 70 mg by mouth every Monday. In the morning.Take with a full glass of water on an empty stomach.   CALCIUM 600+D3 PO Take 1 tablet by mouth daily.   CENTRUM SILVER tablet Take 1 tablet by mouth daily.   methocarbamol 500 MG tablet Commonly known as:  ROBAXIN Take 500 mg by mouth every 6 (six) hours as needed for muscle spasms.   oxyCODONE 5 MG immediate release tablet Commonly known as:  Oxy IR/ROXICODONE Take 1 tablet (5 mg total) by mouth every 3 (three) hours as needed for moderate pain ((score 4 to 6)).   traMADol 50 MG tablet Commonly known as:  ULTRAM Take 1-2 tablets (50-100 mg total) by mouth every 4 (four) hours as needed for moderate pain.       Review of Systems  Constitutional: Negative for activity change, appetite change, chills, diaphoresis and fever.  HENT: Negative for congestion, mouth sores, nosebleeds, postnasal drip, sneezing, sore throat, trouble swallowing and voice change.   Respiratory: Negative for apnea, cough, choking, chest tightness, shortness of breath and wheezing.   Cardiovascular: Negative for chest pain, palpitations and leg swelling.  Gastrointestinal: Negative for abdominal distention, abdominal pain, constipation, diarrhea and nausea.  Genitourinary: Negative for difficulty urinating, dysuria, frequency and urgency.  Musculoskeletal: Positive for arthralgias (typical arthritis) and myalgias. Negative for back pain and gait problem.  Skin: Positive for wound. Negative for color change, pallor and rash.  Neurological: Negative for dizziness, tremors, syncope, speech  difficulty, weakness, numbness and headaches.  Psychiatric/Behavioral: Negative for agitation and behavioral problems.  All other systems reviewed and are negative.   Vitals:   06/21/17 1524  BP: 114/68  Pulse: 87  Resp: 20  Temp: 98.3 F (36.8 C)  TempSrc: Oral  SpO2: 98%  Weight: 145 lb (65.8 kg)  Height: 5\' 2"  (1.575 m)   Body mass index is 26.52 kg/m. Physical Exam  Constitutional: She is oriented to person, place, and time. Vital signs are normal. She appears well-developed and well-nourished. She is active and cooperative. She does not appear ill. No distress.  HENT:  Head: Normocephalic and atraumatic.  Mouth/Throat: Uvula is midline, oropharynx is clear and moist and mucous membranes are normal. Mucous membranes are not pale, not dry and not cyanotic.  Eyes: Pupils are equal, round, and reactive to light. Conjunctivae, EOM and lids are normal.  Neck: Trachea normal, normal range of  motion and full passive range of motion without pain. Neck supple. No JVD present. No tracheal deviation, no edema and no erythema present. No thyromegaly present.  Cardiovascular: Normal rate, regular rhythm, normal heart sounds, intact distal pulses and normal pulses. Exam reveals no gallop, no distant heart sounds and no friction rub.  No murmur heard. Pulses:      Dorsalis pedis pulses are 2+ on the right side, and 2+ on the left side.  No edema  Pulmonary/Chest: Effort normal and breath sounds normal. No accessory muscle usage. No respiratory distress. She has no decreased breath sounds. She has no wheezes. She has no rhonchi. She has no rales. She exhibits no tenderness.  Abdominal: Soft. Normal appearance and bowel sounds are normal. She exhibits no distension and no ascites. There is no tenderness.  Musculoskeletal: She exhibits no edema.       Left knee: She exhibits decreased range of motion and laceration. Tenderness found.  Expected osteoarthritis, stiffness; Bilateral Calves soft,  supple. Negative Homan's Sign. B- pedal pulses equal; status post left total knee replacement  Neurological: She is alert and oriented to person, place, and time. She has normal strength.  Skin: Skin is warm and dry. Laceration noted. She is not diaphoretic. No cyanosis. No pallor. Nails show no clubbing.  Psychiatric: She has a normal mood and affect. Her speech is normal and behavior is normal. Judgment and thought content normal. Cognition and memory are normal.  Nursing note and vitals reviewed.   Labs reviewed: Basic Metabolic Panel: Recent Labs    05/26/17 0957  NA 140  K 3.5  CL 101  CO2 31  GLUCOSE 87  BUN 21*  CREATININE 0.95  CALCIUM 9.5   Liver Function Tests: Recent Labs    05/26/17 0957  AST 23  ALT 16  ALKPHOS 48  BILITOT 1.0  PROT 7.4  ALBUMIN 4.1   No results for input(s): LIPASE, AMYLASE in the last 8760 hours. No results for input(s): AMMONIA in the last 8760 hours. CBC: Recent Labs    05/26/17 0957  WBC 7.3  HGB 15.4  HCT 45.4  MCV 93.2  PLT 217   Cardiac Enzymes: No results for input(s): CKTOTAL, CKMB, CKMBINDEX, TROPONINI in the last 8760 hours. BNP: Invalid input(s): POCBNP CBG: No results for input(s): GLUCAP in the last 8760 hours.  Procedures and Imaging Studies During Stay: Dg Knee Left Port  Result Date: 06/07/2017 CLINICAL DATA:  Status post left total knee joint prosthesis placement. EXAM: PORTABLE LEFT KNEE - 1-2 VIEW COMPARISON:  Preoperative study of November 03, 2016 FINDINGS: The patient has undergone total knee joint prosthesis placement. Radiographic positioning of the prosthetic components is good. The interface with the native bone is normal. There surgical drain lines and skin staples present. IMPRESSION: There is no immediate postprocedure complication following left knee joint prosthesis placement. Electronically Signed   By: David  [place] M.D.   On: 06/07/2017 10:52    Assessment/Plan:   Primary osteoarthritis of left  knee Status post total left knee replacement  Continue working with PT/OT  Continue exercises as taught by PT/OT  Continue ice pack/Polar Care continuous to the knee  Skin care per protocol  Continue Tylenol 650 mg p.o. every 6 hours as needed pain  Continue oxycodone 5 mg p.o. every 3 hours as needed pain #30, no refill  Continue Robaxin 500 mg p.o. every 6 hours as needed pain  Follow-up with orthopedist as instructed for postop care  Foot pain, left  X-rays negative   pain resolved  Pain of left calf  Doppler negative  Pain resolved  Patient is being discharged with the following home health services[facility]  Patient is being discharged with the following durable medical equipment: none, Patient will obtain own tub bench  Patient has been advised to f/u with their PCP in 1-2 weeks to bring them up to date on their rehab stay.  Social services at facility was responsible for arranging this appointment.  Pt was provided with a 30 day supply of prescriptions for medications and refills must be obtained from their PCP.  For controlled substances, a more limited supply may be provided adequate until PCP appointment only.  Future labs/tests needed:    Family/ staff Communication:   Total Time:  Documentation:  Face to Face:  Family/Phone:  Brynda RimShannon H. Zully Frane, NP-C Geriatrics [facility] [address] [place], [place] [address] Cell Phone (Mon-Fri 8am-5pm):  [phone] On Call:  [phone] & follow prompts after 5pm & weekends Office Phone:  [phone] Office Fax:  [phone]

## 2017-06-28 ENCOUNTER — Other Ambulatory Visit: Payer: Self-pay

## 2017-06-28 ENCOUNTER — Emergency Department
Admission: EM | Admit: 2017-06-28 | Discharge: 2017-06-28 | Disposition: A | Discharge status: Home / Self Care | Payer: Medicare PPO | Attending: Emergency Medicine | Admitting: Emergency Medicine

## 2017-06-28 DIAGNOSIS — K5900 Constipation, unspecified: Secondary | ICD-10-CM | POA: Diagnosis not present

## 2017-06-28 DIAGNOSIS — Z79899 Other long term (current) drug therapy: Secondary | ICD-10-CM | POA: Diagnosis not present

## 2017-06-28 DIAGNOSIS — Z96652 Presence of left artificial knee joint: Secondary | ICD-10-CM | POA: Insufficient documentation

## 2017-06-28 DIAGNOSIS — R1084 Generalized abdominal pain: Secondary | ICD-10-CM | POA: Diagnosis present

## 2017-06-28 MED ORDER — MAGNESIUM CITRATE PO SOLN
1.0000 | Freq: Once | ORAL | Status: AC
  Administered 2017-06-28: 1 via ORAL
  Filled 2017-06-28: qty 296

## 2017-06-28 MED ORDER — DOCUSATE SODIUM 50 MG/5ML PO LIQD
100.0000 mg | ORAL | Status: AC
  Administered 2017-06-28: 100 mg
  Filled 2017-06-28: qty 10

## 2017-06-28 NOTE — Discharge Instructions (Signed)
You were seen in the emergency department today for abdominal pain we believe was due to constipation.  We recommend that you use one or more of the following over-the-counter medications in the order described:   1)  Colace (or Dulcolax) 100 mg:  This is a stool softener, and you may take it once or twice a day as needed. 2)  Senna tablets:  This is a bowel stimulant that will help "push" out your stool. It is the next step to add after you have tried a stool softener. 3)  Miralax (powder):  This medication works by drawing additional fluid into your intestines and helps to flush out your stool.  Mix the powder with water or juice according to label instructions.  It may help if the Colace and Senna are not sufficient, but you must be sure to use the recommended amount of water or juice when you mix up the powder.  Remember that narcotic pain medications are constipating, so avoid them or minimize their use.  Drink plenty of fluids.  Please return to the Emergency Department immediately if you develop new or worsening symptoms that concern you, such as (but not limited to) fever > 101 degrees, severe abdominal pain, or persistent vomiting.

## 2017-06-28 NOTE — ED Provider Notes (Signed)
[facility] Emergency Department Provider Note  ____________________________________________   First MD Initiated Contact with Patient 06/28/17 0413     (approximate)  I have reviewed the triage vital signs and the nursing notes.   HISTORY  Chief Complaint Abdominal Pain and Constipation    HPI Dawn Clark is a 84 y.o. female who presents with generalized abdominal pain, distention, and constipation.  It has been gradually worsening over about the last week.  She had orthopedic surgery on her left knee about 3 weeks ago and has been on pain medicine and taking stool softeners, but she seems to be getting increasingly constipated.  She has had no nausea and no vomiting but reports gradually worsening and severe abdominal pain and constipation particularly over the last few days.  Nothing helps and nothing in particular makes it worse.  She feels pressure in her rectum and rectal pain as well as the generalized waves of cramping, sharp, and aching abdominal pain.  She denies fever/chills, chest pain, shortness of breath, nausea, vomiting, and dysuria.  Past Medical History:  Diagnosis Date  . Arthritis   . Chicken pox   . Osteoporosis   . Vertigo    x1 over 2 yrs    Patient Active Problem List   Diagnosis Date Noted  . S/P total knee arthroplasty 06/07/2017  . Primary osteoarthritis of left knee 03/14/2017    Past Surgical History:  Procedure Laterality Date  . ABDOMINAL HYSTERECTOMY    . CATARACT EXTRACTION W/PHACO Right 06/17/2016   Procedure: CATARACT EXTRACTION PHACO AND INTRAOCULAR LENS PLACEMENT (IOC)  Right Complicated;  Surgeon: Lockie Mola, MD;  Location: [facility];  Service: Ophthalmology;  Laterality: Right;  possible malyugin right eye  . CATARACT EXTRACTION W/PHACO Left 07/15/2016   Procedure: CATARACT EXTRACTION PHACO AND INTRAOCULAR LENS PLACEMENT (IOC);  Surgeon: Lockie Mola, MD;  Location: [facility];  Service: Ophthalmology;  Laterality: Left;  left  . KNEE ARTHROPLASTY Left 06/07/2017   Procedure: COMPUTER ASSISTED TOTAL KNEE ARTHROPLASTY;  Surgeon: Donato Heinz, MD;  Location: [facility];  Service: Orthopedics;  Laterality: Left;  . WISDOM TOOTH EXTRACTION      Prior to Admission medications   Medication Sig Start Date End Date Taking? Authorizing Provider  acetaminophen (TYLENOL) 325 MG tablet Take 650 mg by mouth every 6 (six) hours as needed.    [provider]  alendronate (FOSAMAX) 70 MG tablet Take 70 mg by mouth every Monday. In the morning.Take with a full glass of water on an empty stomach.    [provider]  Calcium Carb-Cholecalciferol (CALCIUM 600+D3 PO) Take 1 tablet by mouth daily.    [provider]  methocarbamol (ROBAXIN) 500 MG tablet Take 500 mg by mouth every 6 (six) hours as needed for muscle spasms.    [provider]  Multiple Vitamins-Minerals (CENTRUM SILVER) tablet Take 1 tablet by mouth daily.    [provider]  oxyCODONE (OXY IR/ROXICODONE) 5 MG immediate release tablet Take 1 tablet (5 mg total) by mouth every 3 (three) hours as needed for moderate pain ((score 4 to 6)). 06/08/17   Tera Partridge, PA  traMADol (ULTRAM) 50 MG tablet Take 1-2 tablets (50-100 mg total) by mouth every 4 (four) hours as needed for moderate pain. 06/10/17   Lorenso Quarry, NP    Allergies Patient has no known allergies.  Family History  Problem Relation Age of Onset  . Heart disease Mother   . Diabetes type  II Father     Social History Social History   Tobacco Use  . Smoking status: Never Smoker  . Smokeless tobacco: Never Used  Substance Use Topics  . Alcohol use: No  . Drug use: No    Review of Systems Constitutional: No fever/chills Eyes: No visual changes. ENT: No sore throat. Cardiovascular: Denies chest pain. Respiratory: Denies shortness of breath. Gastrointestinal: Abdominal pain and constipation as described  above Genitourinary: Negative for dysuria. Musculoskeletal: Negative for neck pain.  Negative for back pain. Integumentary: Negative for rash. Neurological: Negative for headaches, focal weakness or numbness.   ____________________________________________   PHYSICAL EXAM:  VITAL SIGNS: ED Triage Vitals  Enc Vitals Group     BP 06/28/17 0323 (!) 150/88     Pulse Rate 06/28/17 0323 89     Resp 06/28/17 0323 17     Temp 06/28/17 0323 97.7 F (36.5 C)     Temp Source 06/28/17 0323 Oral     SpO2 06/28/17 0323 100 %     Weight --      Height --      Head Circumference --      Peak Flow --      Pain Score 06/28/17 0324 7     Pain Loc --      Pain Edu? --      Excl. in GC? --     Constitutional: Alert and oriented.  Nearly well-appearing but does appear to be in significant distress Eyes: Conjunctivae are normal.  Head: Atraumatic. Nose: No congestion/rhinnorhea. Mouth/Throat: Mucous membranes are moist. Neck: No stridor.  No meningeal signs.   Cardiovascular: Normal rate, regular rhythm. Good peripheral circulation. Grossly normal heart sounds. Respiratory: Normal respiratory effort.  No retractions. Lungs CTAB. Gastrointestinal: Soft but with mild distention and diffuse tenderness throughout. Rectal: Large volume of hard stool all throughout the rectal vault.  I was able to disimpact some of it but the patient could not tolerate adequate disimpaction.  ED chaperone present throughout exam.  Musculoskeletal: No lower extremity tenderness nor edema. No gross deformities of extremities; normal postoperative changes and well appearing surgical wounds to the left knee Neurologic:  Normal speech and language. No gross focal neurologic deficits are appreciated.  Skin:  Skin is warm, dry and intact. No rash noted. Psychiatric: Mood and affect are normal. Speech and behavior are normal.  ____________________________________________   LABS (all labs ordered are listed, but only  abnormal results are displayed)  Labs Reviewed - No data to display ____________________________________________  EKG  None - EKG not ordered by ED physician ____________________________________________  RADIOLOGY   ED MD interpretation: No indication for imaging  Official radiology report(s): No results found.  ____________________________________________   PROCEDURES  Critical Care performed: No   Procedure(s) performed:   Procedures  ------------------------------------------------------------------------------------------------------------------- Fecal Disimpaction Procedure Note:  Performed by me:  Patient placed in the lateral recumbent position with knees drawn towards chest. Nurse present for patient support. Small amount of hard brown stool removed. Patient asked me to abort procedure due to pain.   ------------------------------------------------------------------------------------------------------------------    ____________________________________________   INITIAL IMPRESSION / ASSESSMENT AND PLAN / ED COURSE  As part of my medical decision making, I reviewed the following data within the electronic MEDICAL RECORD NUMBER History obtained from family and Nursing notes reviewed and incorporated    Differential diagnosis includes, but is not limited to, all acute and emergent causes of abdominal pain, particularly in the elderly population, such as appendicitis, mesenteric ischemia, diverticulitis, biliary  colic/cholecystitis, etc.  However, in this case the immediate issue does seem to be constipation secondary to the narcotics she has been taking her orthopedic injury.  The patient was in severe distress and I attempted manual disimpaction, but only a small volume came out before she asked me to stop due to discomfort.  We will try an enema and reassess.  Clinical Course as of Jun 29 607  Mon Jun 28, 2017  0602 The patient had a large volume bowel  movement after retaining the enema for a while.  She feels completely better afterwards and is visibly breathing easier.  She says that her abdomen feels "a little sore" but no longer is painful.  I reassessed her and she has the very slightest tenderness to deep palpation but her exam is much improved from prior and she even laughed a little bit during my abdominal exam.  I offered to check blood work and obtain imaging but I explained to have a very low suspicion of any acute intra-abdominal process, including but not limited to diverticulitis, and she prefers to go home and "see how it goes".  Her family is comfortable with this plan as well.  Her vital signs are stable and she is currently asymptomatic.  I had my usual customary constipation discussion with the patient and  I gave my usual and customary return precautions.   [CF]    Clinical Course User Index [CF] Loleta Rose, MD    ____________________________________________  FINAL CLINICAL IMPRESSION(S) / ED DIAGNOSES  Final diagnoses:  Constipation, unspecified constipation type  Generalized abdominal pain     MEDICATIONS GIVEN DURING THIS VISIT:  Medications  docusate (COLACE) 50 MG/5ML liquid 100 mg (not administered)  magnesium citrate solution 1 Bottle (not administered)     ED Discharge Orders    None       Note:  This document was prepared using Dragon voice recognition software and may include unintentional dictation errors.    Loleta Rose, MD 06/28/17 [phone]

## 2017-06-28 NOTE — ED Triage Notes (Signed)
Patient reports abdominal pain and constipation.  Patient reports last "good" bowel movement was at the beginning of last week.  Patient had knee surgery 06/07/17.

## 2017-06-28 NOTE — ED Notes (Signed)
Pt discharged home after verbalizing understanding of discharge instructions; nad noted. 

## 2017-06-28 NOTE — ED Notes (Signed)
ED Provider at bedside. 

## 2017-06-28 NOTE — ED Notes (Signed)
Patient tolerated approximately 750 ml of 1000 ml enema, tolerated well.

## 2017-11-29 ENCOUNTER — Other Ambulatory Visit: Payer: Self-pay | Admitting: Internal Medicine

## 2017-11-29 DIAGNOSIS — M810 Age-related osteoporosis without current pathological fracture: Secondary | ICD-10-CM

## 2017-12-22 ENCOUNTER — Ambulatory Visit
Admission: RE | Admit: 2017-12-22 | Discharge: 2017-12-22 | Disposition: A | Discharge status: Home / Self Care | Payer: Medicare PPO | Source: Ambulatory Visit | Attending: Internal Medicine | Admitting: Internal Medicine

## 2017-12-22 DIAGNOSIS — M810 Age-related osteoporosis without current pathological fracture: Secondary | ICD-10-CM | POA: Diagnosis not present

## 2020-02-12 ENCOUNTER — Ambulatory Visit: Payer: Medicare HMO | Attending: Internal Medicine

## 2020-02-12 DIAGNOSIS — Z23 Encounter for immunization: Secondary | ICD-10-CM

## 2020-02-12 NOTE — Progress Notes (Signed)
   Covid-19 Vaccination Clinic  Name:  Dawn Clark    MRN: [id number] DOB: 03/19/1929  02/12/2020  [name] was observed post Covid-19 immunization for 15 minutes without incident. She was provided with Vaccine Information Sheet and instruction to access the V-Safe system.   [name] was instructed to call 911 with any severe reactions post vaccine: Marland Kitchen Difficulty breathing  . Swelling of face and throat  . A fast heartbeat  . A bad rash all over body  . Dizziness and weakness

## 2020-04-13 DIAGNOSIS — M1711 Unilateral primary osteoarthritis, right knee: Secondary | ICD-10-CM | POA: Insufficient documentation

## 2020-07-02 DIAGNOSIS — M1711 Unilateral primary osteoarthritis, right knee: Secondary | ICD-10-CM

## 2020-07-02 HISTORY — DX: Unilateral primary osteoarthritis, right knee: M17.11

## 2020-08-17 NOTE — Discharge Instructions (Signed)
Instructions after Total Knee Replacement   Toneisha Savary P. Sharea Guinther, Jr., M.D.     Dept. of Orthopaedics & Sports Medicine  [facility]  [address]  [place], [place]  [address]  Phone: [phone]   Fax: [phone]    DIET: Drink plenty of non-alcoholic fluids. Resume your normal diet. Include foods high in fiber.  ACTIVITY:  You may use crutches or a walker with weight-bearing as tolerated, unless instructed otherwise. You may be weaned off of the walker or crutches by your Physical Therapist.  Do NOT place pillows under the knee. Anything placed under the knee could limit your ability to straighten the knee.   Continue doing gentle exercises. Exercising will reduce the pain and swelling, increase motion, and prevent muscle weakness.   Please continue to use the TED compression stockings for 6 weeks. You may remove the stockings at night, but should reapply them in the morning. Do not drive or operate any equipment until instructed.  WOUND CARE:  Continue to use the PolarCare or ice packs periodically to reduce pain and swelling. You may bathe or shower after the staples are removed at the first office visit following surgery.  MEDICATIONS: You may resume your regular medications. Please take the pain medication as prescribed on the medication. Do not take pain medication on an empty stomach. You have been given a prescription for a blood thinner (Lovenox or Coumadin). Please take the medication as instructed. (NOTE: After completing a 2 week course of Lovenox, take one Enteric-coated aspirin once a day. This along with elevation will help reduce the possibility of phlebitis in your operated leg.) Do not drive or drink alcoholic beverages when taking pain medications.  CALL THE OFFICE FOR: Temperature above 101 degrees Excessive bleeding or drainage on the dressing. Excessive swelling, coldness, or paleness of the toes. Persistent nausea and vomiting.  FOLLOW-UP:  You  should have an appointment to return to the office in 10-14 days after surgery. Arrangements have been made for continuation of Physical Therapy (either home therapy or outpatient therapy).   [facility] Department Directory         [email or web address]       [email or web address]          Cardiology  Appointments: [place] - [phone] [place] - [phone]  Endocrinology  Appointments: [place] - [phone] [place] - [phone]  Gastroenterology  Appointments: [place] - [phone] [place] - [phone]        General Surgery   Appointments: [place] - [phone]  Internal Medicine/Family Medicine  Appointments: [place] - [phone] [place] - [phone] [place] - [phone]  Metabolic and Weigh Loss Surgery  Appointments: [place] - [phone]        Neurology  Appointments: [place] - [phone] [place] - [phone]  Neurosurgery  Appointments: [place] - [phone]  Obstetrics & Gynecology  Appointments: [place] - [phone] [place] - [phone]        Pediatrics  Appointments: [place] - [phone] [place] - [phone]  Physiatry  Appointments: [place] -[phone]  Physical Therapy  Appointments: [place] - [phone] [place] - [phone]        Podiatry  Appointments: [place] - [phone] [place] - [phone]  Pulmonology  Appointments: [place] - [phone]  Rheumatology  Appointments: [place] - [phone]        [place] Location: [facility]  [address] , [place]  [address]  [place] Location: [facility] [address] [place], [place]  [address]  [place] Location: [facility] [address] [place],   [address]    

## 2020-08-17 NOTE — H&P (Signed)
ORTHOPAEDIC HISTORY & PHYSICAL Dawn Clark, Dawn Clark., MD - 07/30/2020 10:30 AM EDT Formatting of this note is different from the original. Images from the original note were not included. Chief Complaint: Chief Complaint  Patient presents with  . Knee Pain  Right knee degenerative arthrosis   Reason for Visit: The patient is a 87 y.o. female who presents today with her daughter-in-law for reevaluation of her right knee. She has a long history of right knee pain. She localizes most of the pain along the lateral aspect of the knee. She reports some swelling, no locking, and some giving way of the knee. The pain is aggravated by any weight bearing. The knee pain limits the patient's ability to ambulate long distances.The patient has not appreciated any significant improvement despite Tylenol, NSAIDs, topical compounds, and viscosupplementation. She is using a cane for ambulation. The patient states that the knee pain has progressed to the point that it is significantly interfering with her activities of daily living.  Medications: Current Outpatient Medications  Medication Sig Dispense Refill  . acetaminophen (TYLENOL ARTHRITIS PAIN) 650 MG ER tablet Take 650 mg by mouth 2 (two) times daily as needed for Pain  . alendronate (FOSAMAX) 70 MG tablet Take 70 mg by mouth every 7 (seven) days. Take with a full glass of water. Do not lie down for the next 30 min.  Marland Kitchen aspirin 81 MG EC tablet Take 81 mg by mouth once daily.  . calcium carbonate-vitamin D3 (CALCIUM 600 + D,3,) 600 mg(1,500mg ) -200 unit tablet Take 1 tablet by mouth once daily.  . Compound Medication Apply 1 g topically 4 (four) times daily as needed (foot pain) BCDGKL compound. Apply 1-2 grams to the affected area 3-4 times daily.(Baclofen 2%, Diclofenac 5%, Gabapentin 6%, Tetracaine 3%)  . MULTIVIT-MIN/IRON/FOLIC/LUTEIN (CENTRUM SILVER WOMEN ORAL) Take 1 tablet by mouth once daily.   No current facility-administered medications for  this visit.   Allergies: No Known Allergies  Past Medical History: Past Medical History:  Diagnosis Date  . Bunion  . Chicken pox  . Hammertoe  . Osteoporosis   Past Surgical History: Past Surgical History:  Procedure Laterality Date  . CATARACT EXTRACTION Bilateral  . Left total knee arthroplasty using computer-assisted navigation 06/07/2017  Dr Ernest Pine  . WISDOM TEETH   Social History: Social History   Socioeconomic History  . Marital status: Widowed  Spouse name: Not on file  . Number of children: 1  . Years of education: 16  . Highest education level: High school graduate  Occupational History  . Occupation: Retired  Tobacco Use  . Smoking status: Never Smoker  . Smokeless tobacco: Never Used  Vaping Use  . Vaping Use: Never used  Substance and Sexual Activity  . Alcohol use: No  . Drug use: No  . Sexual activity: Defer  Partners: Male  Other Topics Concern  . Not on file  Social History Narrative  . Not on file   Social Determinants of Health   Financial Resource Strain: Not on file  Food Insecurity: Not on file  Transportation Needs: Not on file  Physical Activity: Not on file  Stress: Not on file  Social Connections: Not on file  Housing Stability: Not on file   Family History: Family History  Problem Relation Age of Onset  . Heart disease Mother  . Diabetes type II Father  . No Known Problems Sister   Review of Systems: A comprehensive 14 point ROS was performed, reviewed, and the pertinent orthopaedic  findings are documented in the HPI.  Exam BP 138/68  Temp 36.5 C (97.7 F)  Ht 149.9 cm (4\' 11" )  Wt 61.7 kg (136 lb)  LMP (LMP Unknown) Comment: Hysterectomy  BMI 27.47 kg/m   General:  Well-developed, well-nourished female seen in no acute distress.  Antalgic gait. Valgus thrust to the right knee.  HEENT:  Atraumatic, normocephalic. Pupils are equal and reactive to light. Extraocular motion is intact. Sclera are clear.  Oropharynx is clear with moist mucosa.  Neck:  Supple, nontender, and with good ROM. No thyromegaly, adenopathy, JVD, or carotid bruits.  Lungs:  Clear to auscultation bilaterally.  Cardiovascular:  Regular rate and rhythm. Normal S1, S2. No murmur. No appreciable gallops or rubs. Peripheral pulses are palpable. No lower extremity edema. Homan`s test is negative.  Abdomen:  Soft, nontender, nondistended. Bowel sounds are present.  Extremities: Good strength, stability, and range of motion of the upper extremities. Good range of motion of the hips and ankles.  Right Knee: Soft tissue swelling: mild Effusion: none Erythema: none Crepitance: mild Tenderness: lateral Alignment: relative valgus Mediolateral laxity: lateral pseudolaxity Posterior sag: negative Patellar tracking: Good tracking without evidence of subluxation or tilt Atrophy: No significant atrophy.  Quadriceps tone was fair to good. Range of motion: 0/0/116 degrees  Neurologic:  Awake, alert, and oriented.  Sensory function is intact to pinprick and light touch.  Motor strength is judged to be 5/5.  Motor coordination is within normal limits.  No apparent clonus. No tremor.   X-rays: I reviewed the right knee radiographs that were performed at [facility] on 04/12/2020. There is narrowing of the lateral cartilage space with associated valgus alignment. Osteophyte formation is noted. Subchondral sclerosis is noted. No evidence of fracture or dislocation.   Impression: Degenerative arthrosis of the right knee  Plan:  The findings were discussed in detail with the patient. The patient was given informational material on total knee replacement. Conservative treatment options were reviewed with the patient. We discussed the risks and benefits of surgical intervention. The usual perioperative course was also discussed in detail. The patient expressed understanding of the risks and benefits of surgical  intervention and would like to proceed with plans for right total knee arthroplasty.  MEDICAL CLEARANCE: Per anesthesiology. ACTIVITY: As tolerated. WORK STATUS: Not applicable. THERAPY: Preoperative physical therapy evaluation. MEDICATIONS: Requested Prescriptions   No prescriptions requested or ordered in this encounter   FOLLOW-UP: Return for preop History & Physical pending surgery date.  Honest Safranek P. 14/02/2020., M.D.  This note was generated in part with voice recognition software and I apologize for any typographical errors that were not detected and corrected. Electronically signed by Angie Fava., MD at 08/04/2020 3:26 PM EDT

## 2020-08-20 ENCOUNTER — Encounter
Admission: RE | Admit: 2020-08-20 | Discharge: 2020-08-20 | Disposition: A | Discharge status: Home / Self Care | Payer: Medicare HMO | Source: Ambulatory Visit | Attending: Orthopedic Surgery | Admitting: Orthopedic Surgery

## 2020-08-20 ENCOUNTER — Other Ambulatory Visit: Payer: Self-pay

## 2020-08-20 DIAGNOSIS — Z01818 Encounter for other preprocedural examination: Secondary | ICD-10-CM | POA: Insufficient documentation

## 2020-08-20 LAB — COMPREHENSIVE METABOLIC PANEL
ALT: 14 U/L (ref 0–44)
AST: 21 U/L (ref 15–41)
Albumin: 4.2 g/dL (ref 3.5–5.0)
Alkaline Phosphatase: 49 U/L (ref 38–126)
Anion gap: 7 (ref 5–15)
BUN: 18 mg/dL (ref 8–23)
CO2: 31 mmol/L (ref 22–32)
Calcium: 10 mg/dL (ref 8.9–10.3)
Chloride: 105 mmol/L (ref 98–111)
Creatinine, Ser: 0.72 mg/dL (ref 0.44–1.00)
GFR, Estimated: >60 mL/min (ref >60)
Glucose, Bld: 97 mg/dL (ref 70–99)
Potassium: 4 mmol/L (ref 3.5–5.1)
Sodium: 143 mmol/L (ref 135–145)
Total Bilirubin: 0.8 mg/dL (ref 0.3–1.2)
Total Protein: 7.2 g/dL (ref 6.5–8.1)

## 2020-08-20 LAB — URINALYSIS, ROUTINE W REFLEX MICROSCOPIC
Bilirubin Urine: NEGATIVE
Glucose, UA: NEGATIVE mg/dL
Hgb urine dipstick: NEGATIVE
Ketones, ur: NEGATIVE mg/dL
Leukocytes,Ua: NEGATIVE
Nitrite: NEGATIVE
Protein, ur: NEGATIVE mg/dL
Specific Gravity, Urine: 1.017 (ref 1.005–1.030)
pH: 6 (ref 5.0–8.0)

## 2020-08-20 LAB — SURGICAL PCR SCREEN
MRSA, PCR: NEGATIVE
Staphylococcus aureus: NEGATIVE

## 2020-08-20 LAB — CBC
HCT: 45.3 % (ref 36.0–46.0)
Hemoglobin: 15 g/dL (ref 12.0–15.0)
MCH: 31.3 pg (ref 26.0–34.0)
MCHC: 33.1 g/dL (ref 30.0–36.0)
MCV: 94.4 fL (ref 80.0–100.0)
Platelets: 194 10*3/uL (ref 150–400)
RBC: 4.8 MIL/uL (ref 3.87–5.11)
RDW: 12.1 % (ref 11.5–15.5)
WBC: 7.2 10*3/uL (ref 4.0–10.5)
nRBC: 0 % (ref 0.0–0.2)

## 2020-08-20 LAB — TYPE AND SCREEN
ABO/RH(D): O NEG
Antibody Screen: NEGATIVE

## 2020-08-20 LAB — SEDIMENTATION RATE: Sed Rate: 2 mm/hr (ref 0–30)

## 2020-08-20 LAB — PROTIME-INR
INR: 1 (ref 0.8–1.2)
Prothrombin Time: 12.9 seconds (ref 11.4–15.2)

## 2020-08-20 LAB — C-REACTIVE PROTEIN: CRP: 0.6 mg/dL (ref <1.0)

## 2020-08-20 LAB — APTT: aPTT: 32 seconds (ref 24–36)

## 2020-08-20 NOTE — Patient Instructions (Addendum)
Your procedure is scheduled on:  Wednesday, April 27 Report to the Registration Desk on the 1st floor of the [facility] To find out your arrival time, please call [phone] between 1PM - 3PM on: Tuesday, April 26  REMEMBER: Instructions that are not followed completely may result in serious medical risk, up to and including death; or upon the discretion of your surgeon and anesthesiologist your surgery may need to be rescheduled.  Do not eat food after midnight the night before surgery.  No gum chewing, lozengers or hard candies.  You may however, drink CLEAR liquids up to 2 hours before you are scheduled to arrive for your surgery. Do not drink anything within 2 hours of your scheduled arrival time.  Clear liquids include: - water  - apple juice without pulp - gatorade (not RED, PURPLE, OR BLUE) - black coffee or tea (Do NOT add milk or creamers to the coffee or tea) Do NOT drink anything that is not on this list.  In addition, your doctor has ordered for you to drink the provided  Ensure Pre-Surgery Clear Carbohydrate Drink  Drinking this carbohydrate drink up to two hours before surgery helps to reduce insulin resistance and improve patient outcomes. Please complete drinking 2 hours prior to scheduled arrival time.  DO NOT TAKE ANY MEDICATIONS THE MORNING OF SURGERY  One week prior to surgery: STARTING April 20 Stop Anti-inflammatories (NSAIDS) such as Advil, Aleve, Ibuprofen, Motrin, Naproxen, Naprosyn and Aspirin based products such as Excedrin, Goodys Powder, BC Powder. Stop ANY OVER THE COUNTER supplements until after surgery.  No Alcohol for 24 hours before or after surgery.  On the morning of surgery brush your teeth with toothpaste and water, you may rinse your mouth with mouthwash if you wish. Do not swallow any toothpaste or mouthwash.  Do not wear jewelry, make-up, hairpins, clips or nail polish.  Do not wear lotions, powders, or perfumes.   Do not shave  body from the neck down 48 hours prior to surgery just in case you cut yourself which could leave a site for infection.  Also, freshly shaved skin may become irritated if using the CHG soap.  Do not bring valuables to the hospital. [facility] is not responsible for any missing/lost belongings or valuables.   Use CHG Soap as directed on instruction sheet.  Notify your doctor if there is any change in your medical condition (cold, fever, infection).  Wear comfortable clothing (specific to your surgery type) to the hospital.  Plan for stool softeners for home use; pain medications have a tendency to cause constipation. You can also help prevent constipation by eating foods high in fiber such as fruits and vegetables and drinking plenty of fluids as your diet allows.  After surgery, you can help prevent lung complications by doing breathing exercises.  Take deep breaths and cough every 1-2 hours. Your doctor may order a device called an Incentive Spirometer to help you take deep breaths.  If you are being admitted to the hospital overnight, leave your suitcase in the car. After surgery it may be brought to your room.  If you are being discharged the day of surgery, you will not be allowed to drive home. You will need a responsible adult (18 years or older) to drive you home and stay with you that night.   If you are taking public transportation, you will need to have a responsible adult (18 years or older) with you. Please confirm with your physician that it  is acceptable to use public transportation.   Please call the Pre-admissions Testing Dept. at [phone] if you have any questions about these instructions.  Surgery Visitation Policy:  Patients undergoing a surgery or procedure may have one family member or support person with them as long as that person is not COVID-19 positive or experiencing its symptoms.  That person may remain in the waiting area during the  procedure.  Inpatient Visitation:    Visiting hours are 7 a.m. to 8 p.m. Inpatients will be allowed two visitors daily. The visitors may change each day during the patient's stay. No visitors under the age of 8. Any visitor under the age of 47 must be accompanied by an adult. The visitor must pass COVID-19 screenings, use hand sanitizer when entering and exiting the patient's room and wear a mask at all times, including in the patient's room. Patients must also wear a mask when staff or their visitor are in the room. Masking is required regardless of vaccination status.

## 2020-08-22 LAB — URINE CULTURE
Culture: <10000 — AB
Special Requests: NORMAL

## 2020-08-26 ENCOUNTER — Other Ambulatory Visit
Admission: RE | Admit: 2020-08-26 | Discharge: 2020-08-26 | Disposition: A | Discharge status: Home / Self Care | Payer: Medicare HMO | Source: Ambulatory Visit | Attending: Orthopedic Surgery | Admitting: Orthopedic Surgery

## 2020-08-26 ENCOUNTER — Other Ambulatory Visit: Payer: Self-pay

## 2020-08-26 DIAGNOSIS — Z20822 Contact with and (suspected) exposure to covid-19: Secondary | ICD-10-CM | POA: Insufficient documentation

## 2020-08-26 DIAGNOSIS — Z01812 Encounter for preprocedural laboratory examination: Secondary | ICD-10-CM | POA: Insufficient documentation

## 2020-08-27 LAB — SARS CORONAVIRUS 2 (TAT 6-24 HRS): SARS Coronavirus 2: NEGATIVE

## 2020-08-27 MED ORDER — LACTATED RINGERS IV SOLN: INTRAVENOUS | Status: DC

## 2020-08-27 MED ORDER — FAMOTIDINE 20 MG PO TABS: 20.0000 mg | ORAL_TABLET | Freq: Once | ORAL | Status: AC

## 2020-08-27 MED ORDER — TRANEXAMIC ACID-NACL 1000-0.7 MG/100ML-% IV SOLN
1000.0000 mg | INTRAVENOUS | Status: AC
  Administered 2020-08-28: 1000 mg via INTRAVENOUS

## 2020-08-27 MED ORDER — CELECOXIB 200 MG PO CAPS: 400.0000 mg | ORAL_CAPSULE | Freq: Once | ORAL | Status: AC

## 2020-08-27 MED ORDER — CHLORHEXIDINE GLUCONATE 0.12 % MT SOLN: 15.0000 mL | Freq: Once | OROMUCOSAL | Status: AC

## 2020-08-27 MED ORDER — CEFAZOLIN SODIUM-DEXTROSE 2-4 GM/100ML-% IV SOLN
2.0000 g | INTRAVENOUS | Status: AC
  Administered 2020-08-28: 2 g via INTRAVENOUS

## 2020-08-27 MED ORDER — DEXAMETHASONE SODIUM PHOSPHATE 10 MG/ML IJ SOLN: 8.0000 mg | Freq: Once | INTRAMUSCULAR | Status: AC

## 2020-08-27 MED ORDER — CHLORHEXIDINE GLUCONATE 4 % EX LIQD: 60.0000 mL | Freq: Once | CUTANEOUS | Status: DC

## 2020-08-27 MED ORDER — ORAL CARE MOUTH RINSE: 15.0000 mL | Freq: Once | OROMUCOSAL | Status: AC

## 2020-08-28 ENCOUNTER — Other Ambulatory Visit: Payer: Self-pay

## 2020-08-28 ENCOUNTER — Inpatient Hospital Stay: Payer: Medicare HMO | Admitting: Anesthesiology

## 2020-08-28 ENCOUNTER — Encounter
Admission: RE | Disposition: A | Discharge status: Home / Self Care | Payer: Self-pay | Source: Home / Self Care | Attending: Orthopedic Surgery

## 2020-08-28 ENCOUNTER — Encounter: Payer: Self-pay | Admitting: Orthopedic Surgery

## 2020-08-28 ENCOUNTER — Inpatient Hospital Stay
Admission: RE | Admit: 2020-08-28 | Discharge: 2020-08-30 | DRG: 470 | Disposition: A | Discharge status: Home / Self Care | Payer: Medicare HMO | Attending: Orthopedic Surgery | Admitting: Orthopedic Surgery

## 2020-08-28 ENCOUNTER — Inpatient Hospital Stay: Payer: Medicare HMO

## 2020-08-28 DIAGNOSIS — M810 Age-related osteoporosis without current pathological fracture: Secondary | ICD-10-CM | POA: Diagnosis present

## 2020-08-28 DIAGNOSIS — M25761 Osteophyte, right knee: Secondary | ICD-10-CM | POA: Diagnosis present

## 2020-08-28 DIAGNOSIS — M1711 Unilateral primary osteoarthritis, right knee: Secondary | ICD-10-CM | POA: Diagnosis present

## 2020-08-28 DIAGNOSIS — Z20822 Contact with and (suspected) exposure to covid-19: Secondary | ICD-10-CM | POA: Diagnosis present

## 2020-08-28 DIAGNOSIS — Z96659 Presence of unspecified artificial knee joint: Secondary | ICD-10-CM

## 2020-08-28 HISTORY — PX: KNEE ARTHROPLASTY: SHX992

## 2020-08-28 SURGERY — ARTHROPLASTY, KNEE, TOTAL, USING IMAGELESS COMPUTER-ASSISTED NAVIGATION
Anesthesia: Spinal | Site: Knee | Laterality: Right

## 2020-08-28 MED ORDER — SURGIPHOR WOUND IRRIGATION SYSTEM - OPTIME
TOPICAL | Status: DC | PRN
  Administered 2020-08-28: 1 via TOPICAL

## 2020-08-28 MED ORDER — BUPIVACAINE HCL (PF) 0.25 % IJ SOLN
INTRAMUSCULAR | Status: DC | PRN
  Administered 2020-08-28: 60 mL

## 2020-08-28 MED ORDER — TETRACAINE HCL 1 % IJ SOLN
INTRAMUSCULAR | Status: DC | PRN
  Administered 2020-08-28: 1 mg via INTRASPINAL

## 2020-08-28 MED ORDER — DIPHENHYDRAMINE HCL 12.5 MG/5ML PO ELIX
12.5000 mg | ORAL_SOLUTION | ORAL | Status: DC | PRN
Start: 2020-08-28 — End: 2020-08-31

## 2020-08-28 MED ORDER — ONDANSETRON HCL 4 MG PO TABS: 4.0000 mg | ORAL_TABLET | Freq: Four times a day (QID) | ORAL | Status: DC | PRN

## 2020-08-28 MED ORDER — FERROUS SULFATE 325 (65 FE) MG PO TABS
325.0000 mg | ORAL_TABLET | Freq: Two times a day (BID) | ORAL | Status: DC
  Administered 2020-08-28 – 2020-08-30 (×5): 325 mg via ORAL
  Filled 2020-08-28 (×5): qty 1

## 2020-08-28 MED ORDER — ENOXAPARIN SODIUM 30 MG/0.3ML ~~LOC~~ SOLN
30.0000 mg | Freq: Two times a day (BID) | SUBCUTANEOUS | Status: DC
  Administered 2020-08-29 – 2020-08-30 (×3): 30 mg via SUBCUTANEOUS
  Filled 2020-08-28 (×3): qty 0.3

## 2020-08-28 MED ORDER — PROPOFOL 500 MG/50ML IV EMUL
INTRAVENOUS | Status: DC | PRN
  Administered 2020-08-28: 50 ug/kg/min via INTRAVENOUS

## 2020-08-28 MED ORDER — CALCIUM-VITAMIN D 500-200 MG-UNIT PO TABS
1.0000 | ORAL_TABLET | Freq: Every day | ORAL | Status: DC
  Filled 2020-08-28 (×4): qty 1

## 2020-08-28 MED ORDER — SODIUM CHLORIDE 0.9 % IV SOLN
INTRAVENOUS | Status: DC | PRN
  Administered 2020-08-28: 60 mL

## 2020-08-28 MED ORDER — ACETAMINOPHEN 10 MG/ML IV SOLN
1000.0000 mg | Freq: Four times a day (QID) | INTRAVENOUS | Status: AC
  Administered 2020-08-28 – 2020-08-29 (×4): 1000 mg via INTRAVENOUS
  Filled 2020-08-28 (×4): qty 100

## 2020-08-28 MED ORDER — PHENOL 1.4 % MT LIQD
1.0000 | OROMUCOSAL | Status: DC | PRN
  Filled 2020-08-28: qty 177

## 2020-08-28 MED ORDER — BUPIVACAINE HCL (PF) 0.25 % IJ SOLN
INTRAMUSCULAR | Status: AC
  Filled 2020-08-28: qty 30

## 2020-08-28 MED ORDER — ENSURE PRE-SURGERY PO LIQD
296.0000 mL | Freq: Once | ORAL | Status: DC
  Filled 2020-08-28: qty 296

## 2020-08-28 MED ORDER — ALUM & MAG HYDROXIDE-SIMETH 200-200-20 MG/5ML PO SUSP: 30.0000 mL | ORAL | Status: DC | PRN

## 2020-08-28 MED ORDER — ADULT MULTIVITAMIN W/MINERALS CH
1.0000 | ORAL_TABLET | Freq: Every day | ORAL | Status: DC
  Administered 2020-08-29 – 2020-08-30 (×2): 1 via ORAL
  Filled 2020-08-28 (×2): qty 1

## 2020-08-28 MED ORDER — TRAMADOL HCL 50 MG PO TABS
50.0000 mg | ORAL_TABLET | ORAL | Status: DC | PRN
  Administered 2020-08-28 – 2020-08-30 (×2): 100 mg via ORAL
  Filled 2020-08-28 (×2): qty 2
  Filled 2020-08-28: qty 1

## 2020-08-28 MED ORDER — OXYCODONE HCL 5 MG PO TABS
10.0000 mg | ORAL_TABLET | ORAL | Status: DC | PRN
Start: 2020-08-28 — End: 2020-08-31
  Administered 2020-08-29: 10 mg via ORAL
  Filled 2020-08-28: qty 2

## 2020-08-28 MED ORDER — CEFAZOLIN SODIUM-DEXTROSE 2-4 GM/100ML-% IV SOLN
2.0000 g | Freq: Four times a day (QID) | INTRAVENOUS | Status: AC
  Administered 2020-08-29: 2 g via INTRAVENOUS
  Filled 2020-08-28 (×2): qty 100

## 2020-08-28 MED ORDER — ACETAMINOPHEN 10 MG/ML IV SOLN
INTRAVENOUS | Status: AC
  Filled 2020-08-28: qty 100

## 2020-08-28 MED ORDER — FLEET ENEMA 7-19 GM/118ML RE ENEM: 1.0000 | ENEMA | Freq: Once | RECTAL | Status: DC | PRN

## 2020-08-28 MED ORDER — OXYCODONE HCL 5 MG PO TABS
5.0000 mg | ORAL_TABLET | ORAL | Status: DC | PRN
Start: 2020-08-28 — End: 2020-08-31
  Administered 2020-08-28: 5 mg via ORAL
  Filled 2020-08-28: qty 1

## 2020-08-28 MED ORDER — CELECOXIB 200 MG PO CAPS
ORAL_CAPSULE | ORAL | Status: AC
  Administered 2020-08-28: 400 mg via ORAL
  Filled 2020-08-28: qty 2

## 2020-08-28 MED ORDER — FAMOTIDINE 20 MG PO TABS
ORAL_TABLET | ORAL | Status: AC
  Administered 2020-08-28: 20 mg via ORAL
  Filled 2020-08-28: qty 1

## 2020-08-28 MED ORDER — DEXAMETHASONE SODIUM PHOSPHATE 10 MG/ML IJ SOLN
INTRAMUSCULAR | Status: AC
  Administered 2020-08-28: 8 mg via INTRAVENOUS
  Filled 2020-08-28: qty 1

## 2020-08-28 MED ORDER — SODIUM CHLORIDE FLUSH 0.9 % IV SOLN
INTRAVENOUS | Status: AC
  Filled 2020-08-28: qty 40

## 2020-08-28 MED ORDER — BUPIVACAINE HCL (PF) 0.5 % IJ SOLN
INTRAMUSCULAR | Status: DC | PRN
  Administered 2020-08-28: 2.6 mL

## 2020-08-28 MED ORDER — SODIUM CHLORIDE 0.9 % IV SOLN: INTRAVENOUS | Status: DC

## 2020-08-28 MED ORDER — BISACODYL 10 MG RE SUPP: 10.0000 mg | Freq: Every day | RECTAL | Status: DC | PRN

## 2020-08-28 MED ORDER — MAGNESIUM HYDROXIDE 400 MG/5ML PO SUSP
30.0000 mL | Freq: Every day | ORAL | Status: DC
  Administered 2020-08-28: 30 mL via ORAL
  Filled 2020-08-28: qty 30

## 2020-08-28 MED ORDER — SENNOSIDES-DOCUSATE SODIUM 8.6-50 MG PO TABS
1.0000 | ORAL_TABLET | Freq: Two times a day (BID) | ORAL | Status: DC
  Administered 2020-08-28 – 2020-08-29 (×3): 1 via ORAL
  Filled 2020-08-28 (×3): qty 1

## 2020-08-28 MED ORDER — FENTANYL CITRATE (PF) 100 MCG/2ML IJ SOLN: 25.0000 ug | INTRAMUSCULAR | Status: DC | PRN

## 2020-08-28 MED ORDER — PROPOFOL 10 MG/ML IV BOLUS
INTRAVENOUS | Status: DC | PRN
  Administered 2020-08-28: 20 mg via INTRAVENOUS

## 2020-08-28 MED ORDER — CEFAZOLIN SODIUM-DEXTROSE 2-4 GM/100ML-% IV SOLN
INTRAVENOUS | Status: AC
  Administered 2020-08-28: 2 g via INTRAVENOUS
  Filled 2020-08-28: qty 100

## 2020-08-28 MED ORDER — TRANEXAMIC ACID-NACL 1000-0.7 MG/100ML-% IV SOLN
INTRAVENOUS | Status: AC
  Administered 2020-08-28: 1000 mg via INTRAVENOUS
  Filled 2020-08-28: qty 100

## 2020-08-28 MED ORDER — PANTOPRAZOLE SODIUM 40 MG PO TBEC
40.0000 mg | DELAYED_RELEASE_TABLET | Freq: Two times a day (BID) | ORAL | Status: DC
  Administered 2020-08-28 – 2020-08-30 (×4): 40 mg via ORAL
  Filled 2020-08-28 (×4): qty 1

## 2020-08-28 MED ORDER — HYDROMORPHONE HCL 1 MG/ML IJ SOLN: 0.5000 mg | INTRAMUSCULAR | Status: DC | PRN

## 2020-08-28 MED ORDER — SODIUM CHLORIDE 0.9 % IR SOLN
Status: DC | PRN
  Administered 2020-08-28: 500 mL

## 2020-08-28 MED ORDER — ACETAMINOPHEN 325 MG PO TABS
325.0000 mg | ORAL_TABLET | Freq: Four times a day (QID) | ORAL | Status: DC | PRN
Start: 2020-08-29 — End: 2020-08-31

## 2020-08-28 MED ORDER — CHLORHEXIDINE GLUCONATE 0.12 % MT SOLN
OROMUCOSAL | Status: AC
  Administered 2020-08-28: 15 mL via OROMUCOSAL
  Filled 2020-08-28: qty 15

## 2020-08-28 MED ORDER — TRANEXAMIC ACID-NACL 1000-0.7 MG/100ML-% IV SOLN
INTRAVENOUS | Status: AC
  Filled 2020-08-28: qty 100

## 2020-08-28 MED ORDER — TRANEXAMIC ACID-NACL 1000-0.7 MG/100ML-% IV SOLN: 1000.0000 mg | Freq: Once | INTRAVENOUS | Status: AC

## 2020-08-28 MED ORDER — METOCLOPRAMIDE HCL 10 MG PO TABS
10.0000 mg | ORAL_TABLET | Freq: Three times a day (TID) | ORAL | Status: AC
  Administered 2020-08-28 – 2020-08-30 (×6): 10 mg via ORAL
  Filled 2020-08-28 (×8): qty 1

## 2020-08-28 MED ORDER — ONDANSETRON HCL 4 MG/2ML IJ SOLN: 4.0000 mg | Freq: Once | INTRAMUSCULAR | Status: DC | PRN

## 2020-08-28 MED ORDER — MENTHOL 3 MG MT LOZG
1.0000 | LOZENGE | OROMUCOSAL | Status: DC | PRN
  Filled 2020-08-28: qty 9

## 2020-08-28 MED ORDER — CELECOXIB 200 MG PO CAPS
200.0000 mg | ORAL_CAPSULE | Freq: Two times a day (BID) | ORAL | Status: DC
  Administered 2020-08-28 – 2020-08-30 (×4): 200 mg via ORAL
  Filled 2020-08-28 (×4): qty 1

## 2020-08-28 MED ORDER — SODIUM CHLORIDE 0.9 % IV SOLN
INTRAVENOUS | Status: DC | PRN
  Administered 2020-08-28: 30 ug/min via INTRAVENOUS

## 2020-08-28 MED ORDER — ONDANSETRON HCL 4 MG/2ML IJ SOLN: 4.0000 mg | Freq: Four times a day (QID) | INTRAMUSCULAR | Status: DC | PRN

## 2020-08-28 SURGICAL SUPPLY — 71 items
ATTUNE PSFEM RTSZ4 NARCEM KNEE (Femur) ×1 IMPLANT
ATTUNE PSRP INSR SZ4 5 KNEE (Insert) ×1 IMPLANT
BASEPLATE TIBIAL ROTATING SZ 4 (Knees) ×1 IMPLANT
BATTERY INSTRU NAVIGATION (MISCELLANEOUS) ×8 IMPLANT
BLADE SAW 70X12.5 (BLADE) ×2 IMPLANT
BLADE SAW 90X13X1.19 OSCILLAT (BLADE) ×2 IMPLANT
BLADE SAW 90X25X1.19 OSCILLAT (BLADE) ×2 IMPLANT
BOWL CEMENT MIXING ADV NOZZLE (MISCELLANEOUS) ×1 IMPLANT
CEMENT HV SMART SET (Cement) ×2 IMPLANT
COOLER POLAR GLACIER W/PUMP (MISCELLANEOUS) ×2 IMPLANT
COVER WAND RF STERILE (DRAPES) ×2 IMPLANT
CUFF TOURN SGL QUICK 24 (TOURNIQUET CUFF) ×1
CUFF TRNQT CYL 24X4X16.5-23 (TOURNIQUET CUFF) IMPLANT
DRAPE 3/4 80X56 (DRAPES) ×2 IMPLANT
DRESSING PEEL AND PLAC PRVNA20 (GAUZE/BANDAGES/DRESSINGS) ×1 IMPLANT
DRSG DERMACEA 8X12 NADH (GAUZE/BANDAGES/DRESSINGS) ×2 IMPLANT
DRSG MEPILEX SACRM 8.7X9.8 (GAUZE/BANDAGES/DRESSINGS) ×2 IMPLANT
DRSG OPSITE POSTOP 4X14 (GAUZE/BANDAGES/DRESSINGS) ×2 IMPLANT
DRSG PEEL AND PLACE PREVENA 20 (GAUZE/BANDAGES/DRESSINGS) ×2
DRSG TEGADERM 4X4.75 (GAUZE/BANDAGES/DRESSINGS) ×2 IMPLANT
DURAPREP 26ML APPLICATOR (WOUND CARE) ×4 IMPLANT
ELECT CAUTERY BLADE 6.4 (BLADE) ×2 IMPLANT
ELECT REM PT RETURN 9FT ADLT (ELECTROSURGICAL) ×2
ELECTRODE REM PT RTRN 9FT ADLT (ELECTROSURGICAL) ×1 IMPLANT
EX-PIN ORTHOLOCK NAV 4X150 (PIN) ×4 IMPLANT
GLOVE SURG ENC MOIS LTX SZ7.5 (GLOVE) ×4 IMPLANT
GLOVE SURG ENC TEXT LTX SZ7.5 (GLOVE) ×4 IMPLANT
GLOVE SURG UNDER LTX SZ8 (GLOVE) ×2 IMPLANT
GLOVE SURG UNDER POLY LF SZ7.5 (GLOVE) ×2 IMPLANT
GOWN STRL REUS W/ TWL LRG LVL3 (GOWN DISPOSABLE) ×2 IMPLANT
GOWN STRL REUS W/ TWL XL LVL3 (GOWN DISPOSABLE) ×1 IMPLANT
GOWN STRL REUS W/TWL LRG LVL3 (GOWN DISPOSABLE) ×2
GOWN STRL REUS W/TWL XL LVL3 (GOWN DISPOSABLE) ×1
HEMOVAC 400CC 10FR (MISCELLANEOUS) ×2 IMPLANT
HOLDER FOLEY CATH W/STRAP (MISCELLANEOUS) ×2 IMPLANT
HOOD PEEL AWAY FLYTE STAYCOOL (MISCELLANEOUS) ×4 IMPLANT
IRRIGATION SURGIPHOR STRL (IV SOLUTION) ×2 IMPLANT
IV NS IRRIG 3000ML ARTHROMATIC (IV SOLUTION) ×2 IMPLANT
KIT TURNOVER KIT A (KITS) ×2 IMPLANT
KNIFE SCULPS 14X20 (INSTRUMENTS) ×2 IMPLANT
LABEL OR SOLS (LABEL) ×2 IMPLANT
MANIFOLD NEPTUNE II (INSTRUMENTS) ×4 IMPLANT
NDL SAFETY ECLIPSE 18X1.5 (NEEDLE) ×1 IMPLANT
NDL SPNL 20GX3.5 QUINCKE YW (NEEDLE) ×2 IMPLANT
NEEDLE HYPO 18GX1.5 SHARP (NEEDLE) ×1
NEEDLE SPNL 20GX3.5 QUINCKE YW (NEEDLE) ×4 IMPLANT
NS IRRIG 500ML POUR BTL (IV SOLUTION) ×2 IMPLANT
PACK TOTAL KNEE (MISCELLANEOUS) ×2 IMPLANT
PAD WRAPON POLAR KNEE (MISCELLANEOUS) ×1 IMPLANT
PATELLA MEDIAL ATTUN 35MM KNEE (Knees) ×1 IMPLANT
PENCIL SMOKE EVACUATOR COATED (MISCELLANEOUS) ×2 IMPLANT
PIN DRILL QUICK PACK ×2 IMPLANT
PIN FIXATION 1/8DIA X 3INL (PIN) ×6 IMPLANT
PULSAVAC PLUS IRRIG FAN TIP (DISPOSABLE) ×2
SOL PREP PVP 2OZ (MISCELLANEOUS) ×2
SOLUTION PREP PVP 2OZ (MISCELLANEOUS) ×1 IMPLANT
SPONGE DRAIN TRACH 4X4 STRL 2S (GAUZE/BANDAGES/DRESSINGS) ×2 IMPLANT
STAPLER SKIN PROX 35W (STAPLE) ×2 IMPLANT
STRAP TIBIA SHORT (MISCELLANEOUS) ×2 IMPLANT
SUCTION FRAZIER HANDLE 10FR (MISCELLANEOUS) ×1
SUCTION TUBE FRAZIER 10FR DISP (MISCELLANEOUS) ×1 IMPLANT
SUT VIC AB 0 CT1 36 (SUTURE) ×4 IMPLANT
SUT VIC AB 1 CT1 36 (SUTURE) ×4 IMPLANT
SUT VIC AB 2-0 CT2 27 (SUTURE) ×2 IMPLANT
SYR 20ML LL LF (SYRINGE) ×2 IMPLANT
SYR 30ML LL (SYRINGE) ×4 IMPLANT
TIP FAN IRRIG PULSAVAC PLUS (DISPOSABLE) ×1 IMPLANT
TOWEL OR 17X26 4PK STRL BLUE (TOWEL DISPOSABLE) ×2 IMPLANT
TOWER CARTRIDGE SMART MIX (DISPOSABLE) ×2 IMPLANT
TRAY FOLEY MTR SLVR 16FR STAT (SET/KITS/TRAYS/PACK) ×2 IMPLANT
WRAPON POLAR PAD KNEE (MISCELLANEOUS) ×2

## 2020-08-28 NOTE — Anesthesia Procedure Notes (Signed)
Date/Time: 08/28/2020 12:01 PM Performed by: Elmarie Mainland, CRNA Pre-anesthesia Checklist: Patient identified, Emergency Drugs available, Suction available, Patient being monitored and Timeout performed Oxygen Delivery Method: Simple face mask

## 2020-08-28 NOTE — Progress Notes (Signed)
Patient arrived from pacu without bone foam, bone foam placed admission done

## 2020-08-28 NOTE — Progress Notes (Signed)
Pt unable to do these motions at this time Post surgery

## 2020-08-28 NOTE — OR PostOp (Incomplete)
PACU TO INPATIENT HANDOFF REPORT  Name/Age/Gender Dawn Clark 87 y.o. female  Code Status    Code Status Orders  (From admission, onward)         Start     Ordered   08/28/20 1604  Full code  Continuous        08/28/20 1604        Code Status History    Date Active Date Inactive Code Status Order ID Comments User Context   06/07/2017 1137 06/09/2017 1449 Full Code [id number]  Donato Heinz, MD Inpatient   Advance Care Planning Activity    Advance Directive Documentation   Flowsheet Row Most Recent Value  Type of Advance Directive Healthcare Power of Attorney  Pre-existing out of facility DNR order (yellow form or pink MOST form) -  "MOST" Form in Place? -      Home/SNF/Other {Discharge Destination:18313::"Home"}  Chief Complaint Total knee replacement status [Z96.659]  Level of Care/Admitting Diagnosis ED Disposition    None      Medical History Past Medical History:  Diagnosis Date  . Arthritis   . Chicken pox   . Degenerative arthritis of right knee 07/2020  . Osteoporosis   . Vertigo    x1 over 2 yrs    Allergies No Known Allergies  IV Location/Drains/Wounds Patient Lines/Drains/Airways Status    Active Line/Drains/Airways    Name Placement date Placement time Site Days   Peripheral IV 08/28/20 Left;Posterior Forearm 08/28/20  1038  Forearm  less than 1   Closed System Drain 1 Left Knee Accordion (Hemovac) 06/07/17  0938  Knee  1178   Closed System Drain 1 Right Knee Accordion (Hemovac) 10 Fr. 08/28/20  1403  Knee  less than 1   Airway 06/07/17  0757  - 1178   Incision (Closed) 06/17/16 Eye Right 06/17/16  1103  - 1533   Incision (Closed) 07/15/16 Eye Left 07/15/16  0929  - 1505   Incision (Closed) 06/07/17 Knee Left 06/07/17  0930  - 1178   Incision (Closed) 08/28/20 Knee Right 08/28/20  1352  - less than 1   Incision (Closed) 08/28/20 Knee Right 08/28/20  1352  - less than 1          Labs/Imaging No results found for this or any  previous visit (from the past 48 hour(s)). DG Knee Right Port  Result Date: 08/28/2020 CLINICAL DATA:  Status post right knee arthroplasty. EXAM: PORTABLE RIGHT KNEE - 1-2 VIEW COMPARISON:  None. FINDINGS: The right femoral and tibial components are well situated. Surgical drain is noted in the soft tissues anterior to the distal femur. Expected postoperative changes are noted in the soft tissues anteriorly. IMPRESSION: Status post right total knee arthroplasty. Electronically Signed   By: Lupita Raider M.D.   On: 08/28/2020 15:37    Pending Labs   Vitals/Pain Today's Vitals   08/28/20 1600 08/28/20 1602 08/28/20 1615 08/28/20 1630  BP:  (!) 149/71 122/76 136/61  Pulse: 77 74 77 78  Resp: 15 17 16 19   Temp:   97.8 F (36.6 C)   TempSrc:      SpO2: 100% 98% 97% 100%  Weight:      Height:      PainSc:  0-No pain 0-No pain     Isolation Precautions @ISOLATION @  Administered Medications Periop Administered Meds from 08/28/2020 1002 to 08/28/2020 1646      Date/Time Order Dose Route Action Action by Comments    08/28/2020 1144 bupivacaine (MARCAINE)  0.5 % injection 2.6 mL Other Given Elmarie Mainland, CRNA     08/28/2020 1349 bupivacaine (PF) (MARCAINE) 0.25 % injection 60 mL Infiltration Given Donato Heinz, MD     08/28/2020 1349 bupivacaine liposome (EXPAREL 1.3%) in normal saline Optime 60 mL Infiltration Given Donato Heinz, MD     08/28/2020 1202 ceFAZolin (ANCEF) 2-4 GM/100ML-% IVPB    Override pull for Anesthesia Elmarie Mainland, CRNA Filed by anesthesia medication administration from clinical order [id number]    08/28/2020 1150 ceFAZolin (ANCEF) IVPB 2g/100 mL premix 2 g Intravenous Given Elmarie Mainland, CRNA     08/28/2020 1046 celecoxib (CELEBREX) capsule 400 mg 400 mg Oral Given Girtha Hake, RN     08/28/2020 1047 chlorhexidine (PERIDEX) 0.12 % solution 15 mL 15 mL Mouth/Throat Given Girtha Hake, [place]     08/28/2020 1048 dexamethasone (DECADRON) injection  8 mg 8 mg Intravenous Given Girtha Hake, RN     08/28/2020 1046 famotidine (PEPCID) tablet 20 mg 20 mg Oral Given Girtha Hake, [place]     08/28/2020 1051 feeding supplement (ENSURE PRE-SURGERY) liquid 296 mL 296 mL Oral Not Given Iris Pert, Silvia F, RN pt "drank it at home, finished it at 03:30 am" pt stated    08/28/2020 1429 lactated ringers infusion   Intravenous Anesthesia Volume Adjustment Junious Silk, CRNA     08/28/2020 1308 lactated ringers infusion   Intravenous New Bag/Given Junious Silk, CRNA     08/28/2020 1130 lactated ringers infusion   Intravenous Restarted Elmarie Mainland, CRNA     08/28/2020 1129 lactated ringers infusion   Intravenous Paused Tyrell Antonio B, CRNA Switch to gravity    08/28/2020 1055 lactated ringers infusion   Intravenous [address] Girtha Hake, [place]     08/28/2020 1047 MEDLINE mouth rinse   Mouth Rinse See Alternative Girtha Hake, RN     08/28/2020 1349 neomycin-polymyxin B GU 40mg -200000 units in normal saline irrigation optime 500 mL Irrigation Given , MD     08/28/2020 1236 phenylephrine (NEO-SYNEPHRINE) 100 mcg/mL in sodium chloride 0.9 % 100 mL infusion 0 mcg/min Intravenous Stopped Noles, 08/30/2020, CRNA     08/28/2020 1209 phenylephrine (NEO-SYNEPHRINE) 100 mcg/mL in sodium chloride 0.9 % 100 mL infusion 10 mcg/min Intravenous Rate/Dose Change 08/30/2020, CRNA     08/28/2020 1155 phenylephrine (NEO-SYNEPHRINE) 100 mcg/mL in sodium chloride 0.9 % 100 mL infusion 30 mcg/min Intravenous New Bag/Given 08/30/2020 B, CRNA     08/28/2020 1142 propofol (DIPRIVAN) 10 mg/mL bolus/IV push 20 mg Intravenous Given 08/30/2020 B, CRNA     08/28/2020 1433 propofol (DIPRIVAN) 500 MG/50ML infusion 35 mcg/kg/min Intravenous Rate/Dose Change Noles, 08/30/2020, CRNA     08/28/2020 1400 propofol (DIPRIVAN) 500 MG/50ML infusion 40 mcg/kg/min Intravenous Rate/Dose Change Noles04/29/2022, CRNA     08/28/2020 1207 propofol (DIPRIVAN) 500  MG/50ML infusion 45 mcg/kg/min Intravenous Rate/Dose Change 1208 B, CRNA     08/28/2020 1150 propofol (DIPRIVAN) 500 MG/50ML infusion 50 mcg/kg/min Intravenous New Bag/Given 08/30/2020, CRNA     08/28/2020 1350 SURGIPHOR WOUND IRRIGATION SYSTEM - OPTIME irrigation 1 application Topical Given 08/30/2020, MD     08/28/2020 1144 tetracaine 1 % injection 1 mg Intraspinal Given 08/30/2020, CRNA     08/28/2020 1158 tranexamic acid (CYKLOKAPRON) 1000MG /150mL IVPB    Override pull for Anesthesia , CRNA Filed by anesthesia medication administration from clinical order 80m    08/28/2020 1157  tranexamic acid (CYKLOKAPRON) IVPB 1,000 mg 1,000 mg Intravenous Given Elmarie Mainland, CRNA     08/28/2020 1600 tranexamic acid (CYKLOKAPRON) IVPB 1,000 mg 0 mg Intravenous Merlyn Lot, RN     08/28/2020 1548 tranexamic acid (CYKLOKAPRON) IVPB 1,000 mg 1,000 mg Intravenous New Bag/Given Jory Sims, RN       Mobility {Mobility:20148}

## 2020-08-28 NOTE — Anesthesia Procedure Notes (Signed)
Spinal  Patient location during procedure: OR Start time: 08/28/2020 11:38 AM End time: 08/28/2020 11:44 AM Reason for block: surgical anesthesia Staffing Performed: resident/CRNA  Resident/CRNA: Nelda Marseille, CRNA Preanesthetic Checklist Completed: patient identified, IV checked, site marked, risks and benefits discussed, surgical consent, monitors and equipment checked, pre-op evaluation and timeout performed Spinal Block Patient position: sitting Prep: Betadine Patient monitoring: heart rate, continuous pulse ox, blood pressure and cardiac monitor Approach: midline Location: L3-4 Injection technique: single-shot Needle Needle type: Whitacre and Introducer  Needle gauge: 25 G Needle length: 9 cm Assessment Sensory level: T10 Events: CSF return Additional Notes Negative paresthesia. Negative blood return. Positive free-flowing CSF. Expiration date of kit checked and confirmed. Patient tolerated procedure well, without complications.

## 2020-08-28 NOTE — Progress Notes (Signed)
PT Cancellation Note  Patient Details Name: Dawn Clark MRN: [id number] DOB: 30-Jul-1928   Cancelled Treatment:    Reason Eval/Treat Not Completed: Other (comment) Attempted to see pt at 1700, she had just arrived at her room and was starting admission, etc.  At 1800 she was eating dinner and reporting that pain had increased greatly in her knee in the last hour.  She did not feel ready to work with PT today and requested to hold today and agreed that we will initiate PT tomorrow AM.   Malachi Pro, DPT 08/28/2020, 6:04 PM

## 2020-08-28 NOTE — Op Note (Signed)
OPERATIVE NOTE  DATE OF SURGERY:  08/28/2020  PATIENT NAME:  Dawn Clark   DOB: 1928-05-24  MRN: [id number]  PRE-OPERATIVE DIAGNOSIS: Degenerative arthrosis of the right knee, primary  POST-OPERATIVE DIAGNOSIS:  Same  PROCEDURE:  Right total knee arthroplasty using computer-assisted navigation  SURGEON:  Jena Gauss. M.D.  ASSISTANT: Baldwin Jamaica, PA-C (present and scrubbed throughout the case, critical for assistance with exposure, retraction, instrumentation, and closure)  ANESTHESIA: spinal  ESTIMATED BLOOD LOSS: 50 mL  FLUIDS REPLACED: 1400 mL of crystalloid  TOURNIQUET TIME: 78 minutes  DRAINS: 2 medium Hemovac drains  SOFT TISSUE RELEASES: Anterior cruciate ligament, posterior cruciate ligament, deep medial collateral ligament, patellofemoral ligament  IMPLANTS UTILIZED: DePuy Attune size 4N posterior stabilized femoral component (cemented), size 4 rotating platform tibial component (cemented), 35 mm medialized dome patella (cemented), and a 5 mm stabilized rotating platform polyethylene insert.  INDICATIONS FOR SURGERY: Dawn Clark is a 87 y.o. year old female with a long history of progressive knee pain. X-rays demonstrated severe degenerative changes in tricompartmental fashion. The patient had not seen any significant improvement despite conservative nonsurgical intervention. After discussion of the risks and benefits of surgical intervention, the patient expressed understanding of the risks benefits and agree with plans for total knee arthroplasty.   The risks, benefits, and alternatives were discussed at length including but not limited to the risks of infection, bleeding, nerve injury, stiffness, blood clots, the need for revision surgery, cardiopulmonary complications, among others, and they were willing to proceed.  PROCEDURE IN DETAIL: The patient was brought into the operating room and, after adequate spinal anesthesia was achieved, a tourniquet was placed  on the patient's upper thigh. The patient's knee and leg were cleaned and prepped with alcohol and DuraPrep and draped in the usual sterile fashion. A "timeout" was performed as per usual protocol. The lower extremity was exsanguinated using an Esmarch, and the tourniquet was inflated to 300 mmHg. An anterior longitudinal incision was made followed by a standard mid vastus approach. The deep fibers of the medial collateral ligament were elevated in a subperiosteal fashion off of the medial flare of the tibia so as to maintain a continuous soft tissue sleeve. The patella was subluxed laterally and the patellofemoral ligament was incised. Inspection of the knee demonstrated severe degenerative changes with full-thickness loss of articular cartilage. Osteophytes were debrided using a rongeur. Anterior and posterior cruciate ligaments were excised. Two 4.0 mm Schanz pins were inserted in the femur and into the tibia for attachment of the array of trackers used for computer-assisted navigation. Hip center was identified using a circumduction technique. Distal landmarks were mapped using the computer. The distal femur and proximal tibia were mapped using the computer. The distal femoral cutting guide was positioned using computer-assisted navigation so as to achieve a 5 distal valgus cut. The femur was sized and it was felt that a size 4N femoral component was appropriate. A size 4 femoral cutting guide was positioned and the anterior cut was performed and verified using the computer. This was followed by completion of the posterior and chamfer cuts. Femoral cutting guide for the central box was then positioned in the center box cut was performed.  Attention was then directed to the proximal tibia. Medial and lateral menisci were excised. The extramedullary tibial cutting guide was positioned using computer-assisted navigation so as to achieve a 0 varus-valgus alignment and 3 posterior slope. The cut was performed  and verified using the computer. The proximal tibia was  sized and it was felt that a size 4 tibial tray was appropriate. Tibial and femoral trials were inserted followed by insertion of a 5 mm polyethylene insert. This allowed for excellent mediolateral soft tissue balancing both in flexion and in full extension. Finally, the patella was cut and prepared so as to accommodate a 35 mm medialized dome patella. A patella trial was placed and the knee was placed through a range of motion with excellent patellar tracking appreciated. The femoral trial was removed after debridement of posterior osteophytes. The central post-hole for the tibial component was reamed followed by insertion of a keel punch. Tibial trials were then removed. Cut surfaces of bone were irrigated with copious amounts of normal saline using pulsatile lavage and then suctioned dry. Polymethylmethacrylate cement was prepared in the usual fashion using a vacuum mixer. Cement was applied to the cut surface of the proximal tibia as well as along the undersurface of a size 4 rotating platform tibial component. Tibial component was positioned and impacted into place. Excess cement was removed using Personal assistant. Cement was then applied to the cut surfaces of the femur as well as along the posterior flanges of the size 4N femoral component. The femoral component was positioned and impacted into place. Excess cement was removed using Personal assistant. A 5 mm polyethylene trial was inserted and the knee was brought into full extension with steady axial compression applied. Finally, cement was applied to the backside of a 35 mm medialized dome patella and the patellar component was positioned and patellar clamp applied. Excess cement was removed using Personal assistant. After adequate curing of the cement, the tourniquet was deflated after a total tourniquet time of 78 minutes. Hemostasis was achieved using electrocautery. The knee was irrigated with copious  amounts of normal saline using pulsatile lavage followed by 500 ml of Surgiphor and then suctioned dry. 20 mL of 1.3% Exparel and 60 mL of 0.25% Marcaine in 40 mL of normal saline was injected along the posterior capsule, medial and lateral gutters, and along the arthrotomy site. A 5 mm stabilized rotating platform polyethylene insert was inserted and the knee was placed through a range of motion with excellent mediolateral soft tissue balancing appreciated and excellent patellar tracking noted. 2 medium drains were placed in the wound bed and brought out through separate stab incisions. The medial parapatellar portion of the incision was reapproximated using interrupted sutures of #1 Vicryl. Subcutaneous tissue was approximated in layers using first #0 Vicryl followed #2-0 Vicryl. The skin was approximated with skin staples. A sterile dressing was applied.  The patient tolerated the procedure well and was transported to the recovery room in stable condition.    Ritika Hellickson P. Angie Fava., M.D.

## 2020-08-28 NOTE — Plan of Care (Signed)
Admission complete.

## 2020-08-28 NOTE — H&P (Signed)
The patient has been re-examined, and the chart reviewed, and there have been no interval changes to the documented history and physical.    The risks, benefits, and alternatives have been discussed at length. The patient expressed understanding of the risks benefits and agreed with plans for surgical intervention.  Caoimhe Damron P. Wissam Resor, Jr. M.D.    

## 2020-08-28 NOTE — Anesthesia Preprocedure Evaluation (Signed)
Anesthesia Evaluation  Patient identified by MRN, date of birth, ID band Patient awake    Reviewed: Allergy & Precautions, H&P , NPO status , Patient's Chart, lab work & pertinent test results, reviewed documented beta blocker date and time   History of Anesthesia Complications Negative for: history of anesthetic complications  Airway Mallampati: I  TM Distance: >3 FB Neck ROM: full    Dental  (+) Caps, Dental Advidsory Given, Teeth Intact, Missing   Pulmonary neg pulmonary ROS,           Cardiovascular Exercise Tolerance: Good negative cardio ROS       Neuro/Psych negative neurological ROS  negative psych ROS   GI/Hepatic negative GI ROS, Neg liver ROS,   Endo/Other  negative endocrine ROS  Renal/GU negative Renal ROS  negative genitourinary   Musculoskeletal  (+) Arthritis , Osteoarthritis,    Abdominal   Peds negative pediatric ROS (+)  Hematology negative hematology ROS (+)   Anesthesia Other Findings Past Medical History: No date: Arthritis No date: Osteoporosis No date: Vertigo     Comment:  x1 over 2 yrs   Reproductive/Obstetrics negative OB ROS                             Anesthesia Physical  Anesthesia Plan  ASA: II  Anesthesia Plan: Spinal   Post-op Pain Management:    Induction:   PONV Risk Score and Plan: 2 and Ondansetron and Dexamethasone  Airway Management Planned: Simple Face Mask and Nasal Cannula  Additional Equipment:   Intra-op Plan:   Post-operative Plan:   Informed Consent: I have reviewed the patients History and Physical, chart, labs and discussed the procedure including the risks, benefits and alternatives for the proposed anesthesia with the patient or authorized representative who has indicated his/her understanding and acceptance.     Dental Advisory Given  Plan Discussed with: Anesthesiologist, CRNA and Surgeon  Anesthesia Plan  Comments:         Anesthesia Quick Evaluation

## 2020-08-28 NOTE — Transfer of Care (Signed)
Immediate Anesthesia Transfer of Care Note  Patient: Dawn Clark  Procedure(s) Performed: COMPUTER ASSISTED TOTAL KNEE ARTHROPLASTY (Right Knee)  Patient Location: PACU  Anesthesia Type:Spinal  Level of Consciousness: awake, alert  and oriented  Airway & Oxygen Therapy: Patient Spontanous Breathing and Patient connected to face mask oxygen  Post-op Assessment: Report given to RN and Post -op Vital signs reviewed and stable  Post vital signs: Reviewed and stable  Last Vitals:  Vitals Value Taken Time  BP 136/69 08/28/20 1451  Temp 36.8 C 08/28/20 1451  Pulse 69 08/28/20 1457  Resp 19 08/28/20 1457  SpO2 100 % 08/28/20 1457  Vitals shown include unvalidated device data.  Last Pain:  Vitals:   08/28/20 1028  TempSrc: Temporal  PainSc: 0-No pain         Complications: No complications documented.

## 2020-08-29 ENCOUNTER — Encounter: Payer: Self-pay | Admitting: Orthopedic Surgery

## 2020-08-29 NOTE — Progress Notes (Signed)
  Subjective: 1 Day Post-Op Procedure(s) (LRB): COMPUTER ASSISTED TOTAL KNEE ARTHROPLASTY (Right) Patient reports pain as well-controlled.  Patient given oxycodone recently, otherwise had had no pain medication since surgery. Pain level much improved now from previous. Patient is well, and has had no acute complaints or problems Plan is to go Home vs SNF after hospital stay. Negative for chest pain and shortness of breath Fever: no Gastrointestinal: negative for nausea and vomiting.   Patient has not had a bowel movement.  Objective: Vital signs in last 24 hours: Temp:  [96.8 F (36 C)-98.3 F (36.8 C)] 97.7 F (36.5 C) (04/28 0448) Pulse Rate:  [69-82] 72 (04/28 0644) Resp:  [12-20] 17 (04/28 0448) BP: (116-149)/(49-78) 127/61 (04/28 0644) SpO2:  [96 %-100 %] 98 % (04/28 0644) Weight:  [64.9 kg] 64.9 kg (04/27 1028)  Intake/Output from previous day:  Intake/Output Summary (Last 24 hours) at 08/29/2020 0802 Last data filed at 08/29/2020 9518 Gross per 24 hour  Intake 3027.56 ml  Output 1110 ml  Net 1917.56 ml    Intake/Output this shift: No intake/output data recorded.  Labs: No results for input(s): HGB in the last 72 hours. No results for input(s): WBC, RBC, HCT, PLT in the last 72 hours. No results for input(s): NA, K, CL, CO2, BUN, CREATININE, GLUCOSE, CALCIUM in the last 72 hours. No results for input(s): LABPT, INR in the last 72 hours.   EXAM General - Patient is Alert, Appropriate and Oriented Extremity - Neurovascular intact Dorsiflexion/Plantar flexion intact Compartment soft Dressing/Incision -Postoperative dressing remains in place., Polar Care in place and working. , Hemovac in place.  Motor Function - intact, moving foot and toes well on exam.  Cardiovascular- Regular rate and rhythm, no murmurs/rubs/gallops Respiratory- Minimal crackles heard in left lower field, otherwise clear bilaterally Gastrointestinal- soft, nontender and active bowel  sounds   Assessment/Plan: 1 Day Post-Op Procedure(s) (LRB): COMPUTER ASSISTED TOTAL KNEE ARTHROPLASTY (Right) Active Problems:   Total knee replacement status  Estimated body mass index is 26.16 kg/m as calculated from the following:   Height as of this encounter: 5\' 2"  (1.575 m).   Weight as of this encounter: 64.9 kg. Advance diet Up with therapy  Discharge will depend on patient's PT progress. Patient does live alone and states that her family all works during the day.  DVT Prophylaxis - Lovenox, Ted hose and foot pumps Weight-Bearing as tolerated to right leg  , PA-C [facility] Orthopaedic Surgery 08/29/2020, 8:02 AM

## 2020-08-29 NOTE — Evaluation (Signed)
Occupational Therapy Evaluation Patient Details Name: Dawn Clark MRN: [id number] DOB: [birth year] Today's Date: 08/29/2020    History of Present Illness 87 y/o female s/p R TKA, h/o L TKA 3 years ago.   Clinical Impression   Patient presenting with decreased I in self care, balance, functional mobility/transfer, endurance, and safety awareness.  Patient reports living at home alone PTA. She performs her own ADLs and IADLs at mod I level with use of SPC for mobility tasks. Pt's family also very supportive of her care. Patient currently functioning at min guard - min A with use of RW for self care. Pt is very pleasant and agreeable with OT intervention. She was getting back into bed with staff when entering the room and agreeable to ambulate in room and transfer to toilet for needs.   Patient will benefit from acute OT to increase overall independence in the areas of ADLs, functional mobility, and safety awareness in order to safely discharge to next venue of care.    Follow Up Recommendations  SNF;Supervision - Intermittent    Equipment Recommendations  Other (comment) (defer to next venue of care)       Precautions / Restrictions Precautions Precautions: Fall Restrictions Weight Bearing Restrictions: Yes RLE Weight Bearing: Weight bearing as tolerated      Mobility Bed Mobility Overal bed mobility: Needs Assistance Bed Mobility: Supine to Sit;Sit to Supine     Supine to sit: Min guard Sit to supine: Min guard   General bed mobility comments: min ucing for hand placement and technique    Transfers Overall transfer level: Needs assistance Equipment used: Rolling walker (2 wheeled) Transfers: Sit to/from [employer] Sit to Stand: Min guard Stand pivot transfers: Min guard            Balance Overall balance assessment: Needs assistance Sitting-balance support: Feet supported Sitting balance-Leahy Scale: Good     Standing balance support: During  functional activity Standing balance-Leahy Scale: Fair                             ADL either performed or assessed with clinical judgement   ADL Overall ADL's : Needs assistance/impaired     Grooming: Standing;Min guard;Wash/dry hands;Wash/dry face;Oral care               Lower Body Dressing: Minimal assistance;Sit to/from stand   Toilet Transfer: Solicitor;Ambulation;RW   Toileting- Architect and Hygiene: Min guard;Sit to/from stand       Functional mobility during ADLs: Min guard;Rolling walker       Vision Patient Visual Report: No change from baseline              Pertinent Vitals/Pain Pain Assessment: 0-10 Pain Score: 3  Pain Location: R knee Pain Descriptors / Indicators: Discomfort;Aching Pain Intervention(s): Limited activity within patient's tolerance;Monitored during session;Repositioned     Hand Dominance Right   Extremity/Trunk Assessment Upper Extremity Assessment Upper Extremity Assessment: Generalized weakness;Overall [facility] for tasks assessed   Lower Extremity Assessment Lower Extremity Assessment: Overall WFL for tasks assessed;Generalized weakness       Communication Communication Communication: No difficulties   Cognition Arousal/Alertness: Awake/alert Behavior During Therapy: WFL for tasks assessed/performed Overall Cognitive Status: Within Functional Limits for tasks assessed  Home Living Family/patient expects to be discharged to:: Skilled nursing facility Living Arrangements: Alone                                      Prior Functioning/Environment Level of Independence: Independent;Independent with assistive device(s)        Comments: Pt drives and runs her errands, able to manage her home w/o assist, though family does live close and can/does check in when needed. She has been using SPC prior to surgery  for increased stability.        OT Problem List: Decreased strength;Decreased range of motion;Decreased activity tolerance;Impaired balance (sitting and/or standing);Decreased knowledge of precautions;Pain;Decreased safety awareness;Decreased knowledge of use of DME or AE      OT Treatment/Interventions: Self-care/ADL training;Manual therapy;Therapeutic exercise;Patient/family education;Balance training;Energy conservation;Therapeutic activities;DME and/or AE instruction    OT Goals(Current goals can be found in the care plan section) Acute Rehab OT Goals Patient Stated Goal: to get stronger OT Goal Formulation: With patient Time For Goal Achievement: 09/12/20 Potential to Achieve Goals: Good  OT Frequency: Min 2X/week   Barriers to D/C:    none known at this time          AM-PAC OT "6 Clicks" Daily Activity     Outcome Measure Help from another person eating meals?: None Help from another person taking care of personal grooming?: A Little Help from another person toileting, which includes using toliet, bedpan, or urinal?: A Little Help from another person bathing (including washing, rinsing, drying)?: A Little Help from another person to put on and taking off regular upper body clothing?: None Help from another person to put on and taking off regular lower body clothing?: A Little 6 Click Score: 20   End of Session Equipment Utilized During Treatment: Rolling walker Nurse Communication: Mobility status  Activity Tolerance: Patient tolerated treatment well Patient left: in bed;with call bell/phone within reach;with bed alarm set  OT Visit Diagnosis: Unsteadiness on feet (R26.81);Muscle weakness (generalized) (M62.81);Pain Pain - Right/Left: Right Pain - part of body: Knee                Time: 1031-1106 OT Time Calculation (min): 35 min Charges:  OT General Charges $OT Visit: 1 Visit OT Evaluation $OT Eval Low Complexity: 1 Low OT Treatments $Self Care/Home Management  : 23-37 mins  Jackquline Denmark, MS, OTR/L , CBIS ascom [phone]  08/29/20, 1:18 PM

## 2020-08-29 NOTE — Progress Notes (Signed)
Physical Therapy Treatment Patient Details Name: Dawn Clark MRN: [id number] DOB: 11/01/1928 Today's Date: 08/29/2020    History of Present Illness 87 y/o female s/p R TKA, h/o L TKA 3 years ago.    PT Comments    Pt continues to do well with PT.  She was able to ambulate ~85 ft, showed good confidence with mobility, did well with lightly resisted exercises and has nearly 90 degrees of flexion.  Pt making good gains, but still unable to manage safely at home and will need a stint in STR to be able to safely do so.  Follow Up Recommendations  SNF     Equipment Recommendations   (TBD at next venue of care)    Recommendations for Other Services       Precautions / Restrictions Precautions Precautions: Fall Restrictions RLE Weight Bearing: Weight bearing as tolerated    Mobility  Bed Mobility Overal bed mobility: Needs Assistance Bed Mobility: Supine to Sit;Sit to Supine     Supine to sit: Min guard     General bed mobility comments: Pt again did well getting to EOB, did not have the dizziness with the movement like she did this AM    Transfers Overall transfer level: Needs assistance Equipment used: Rolling walker (2 wheeled) Transfers: Sit to/from [employer] Sit to Stand: Min guard         General transfer comment: Pt still needing some cuing for hand use and sequencing, but was more confident and w/o dizziness this afternoon with transition to standing.  Ambulation/Gait Ambulation/Gait assistance: Min assist Gait Distance (Feet): 85 Feet Assistive device: Rolling walker (2 wheeled)       General Gait Details: Pt with increased speed and confidence this afternoon.  She did not rely excessively on the walker (able to maintain consistent walker momentum) but displayed decreased stance time on the L with mildlimp   Stairs             Wheelchair Mobility    Modified Rankin (Stroke Patients Only)       Balance Overall balance  assessment: Needs assistance Sitting-balance support: Feet supported Sitting balance-Leahy Scale: Good     Standing balance support: Bilateral upper extremity supported Standing balance-Leahy Scale: Good                              Cognition Arousal/Alertness: Awake/alert Behavior During Therapy: WFL for tasks assessed/performed Overall Cognitive Status: Within Functional Limits for tasks assessed                                        Exercises Total Joint Exercises Ankle Circles/Pumps: AROM;10 reps Quad Sets: Strengthening;10 reps Short Arc Quad: AROM;15 reps Heel Slides: AROM;10 reps (with resisted leg extensions) Hip ABduction/ADduction: Strengthening;10 reps Straight Leg Raises: AROM;10 reps;Strengthening Knee Flexion: PROM;5 reps    General Comments        Pertinent Vitals/Pain Pain Assessment: 0-10 Pain Score: 2  Pain Location: R knee    Home Living                      Prior Function            PT Goals (current goals can now be found in the care plan section) Progress towards PT goals: Progressing toward goals    Frequency  BID      PT Plan Current plan remains appropriate    Co-evaluation              AM-PAC PT "6 Clicks" Mobility   Outcome Measure  Help needed turning from your back to your side while in a flat bed without using bedrails?: None Help needed moving from lying on your back to sitting on the side of a flat bed without using bedrails?: None Help needed moving to and from a bed to a chair (including a wheelchair)?: A Little Help needed standing up from a chair using your arms (e.g., wheelchair or bedside chair)?: A Little Help needed to walk in hospital room?: A Little Help needed climbing 3-5 steps with a railing? : A Lot 6 Click Score: 19    End of Session Equipment Utilized During Treatment: Gait belt Activity Tolerance: Patient tolerated treatment well Patient left: with chair  alarm set;with call bell/phone within reach Nurse Communication: Mobility status PT Visit Diagnosis: Muscle weakness (generalized) (M62.81);Difficulty in walking, not elsewhere classified (R26.2);Pain Pain - Right/Left: Right Pain - part of body: Knee     Time: 1410-1435 PT Time Calculation (min) (ACUTE ONLY): 25 min  Charges:  $Gait Training: 8-22 mins $Therapeutic Exercise: 8-22 mins                     Malachi Pro, DPT 08/29/2020, 4:31 PM

## 2020-08-29 NOTE — Anesthesia Postprocedure Evaluation (Signed)
Anesthesia Post Note  Patient: Dawn Clark  Procedure(s) Performed: COMPUTER ASSISTED TOTAL KNEE ARTHROPLASTY (Right Knee)  Patient location during evaluation: Nursing Unit Anesthesia Type: Spinal Level of consciousness: awake and oriented Pain management: pain level controlled Vital Signs Assessment: post-procedure vital signs reviewed and stable Respiratory status: respiratory function stable Cardiovascular status: stable Postop Assessment: no headache, no backache, no apparent nausea or vomiting, patient able to bend at knees, adequate PO intake and able to ambulate Anesthetic complications: no   No complications documented.   Last Vitals:  Vitals:   08/29/20 0448 08/29/20 0644  BP: (!) 116/49 127/61  Pulse: 69 72  Resp: 17   Temp: 36.5 C   SpO2: 96% 98%    Last Pain:  Vitals:   08/29/20 0709  TempSrc:   PainSc: 7                  Zachary George

## 2020-08-29 NOTE — TOC Progression Note (Signed)
Transition of Care [facility]) - Progression Note    Patient Details  Name: Dawn Clark MRN: [id number] Date of Birth: May 21, 1928  Transition of Care [facility]) CM/SW [place], RN Phone Number: 08/29/2020, 3:33 PM  Clinical Narrative:    Met with the patient in the room to discuss DC plan and needs She lives alone, She wants to go to SNF, Agreeable to bedsearch, PASSR obtained, FL2 completed, Bedsearch sent, will need Ins approval, has had covid vaccines and booster        Expected Discharge Plan and Services                                                 Social Determinants of Health (SDOH) Interventions    Readmission Risk Interventions No flowsheet data found.

## 2020-08-29 NOTE — NC FL2 (Signed)
[place] MEDICAID FL2 LEVEL OF CARE SCREENING TOOL     IDENTIFICATION  Patient Name: Dawn Clark Birthdate: [birth year] Sex: female Admission Date (Current Location): 08/28/2020  [place] and [place] Number:  Chiropodist and Address:  [facility], [address], [place], [place] [address]      Provider Number: [id number]  Attending Physician Name and Address:  Donato Heinz, MD  Relative Name and Phone Number:  Alinda Money son, [phone]    Current Level of Care: Hospital Recommended Level of Care: Skilled Nursing Facility Prior Approval Number:    Date Approved/Denied:   PASRR Number: [id number]  Discharge Plan: SNF    Current Diagnoses: Patient Active Problem List   Diagnosis Date Noted  . Total knee replacement status 08/28/2020  . Primary osteoarthritis of right knee 04/13/2020  . Age-related osteoporosis without current pathological fracture 06/10/2017  . Status post total left knee replacement 06/07/2017    Orientation RESPIRATION BLADDER Height & Weight     Self,Time,Situation,Place  Normal Continent Weight: 64.9 kg Height:  5\' 2"  (157.5 cm)  BEHAVIORAL SYMPTOMS/MOOD NEUROLOGICAL BOWEL NUTRITION STATUS      Continent Diet (regular)  AMBULATORY STATUS COMMUNICATION OF NEEDS Skin   Extensive Assist Verbally Surgical wounds                       Personal Care Assistance Level of Assistance  Bathing,Dressing Bathing Assistance: Limited assistance   Dressing Assistance: Limited assistance     Functional Limitations Info             SPECIAL CARE FACTORS FREQUENCY  PT (By licensed PT)     PT Frequency: 5 times per week              Contractures      Additional Factors Info  Code Status,Allergies Code Status Info: full code             Current Medications (08/29/2020):  This is the current hospital active medication list Current Facility-Administered Medications  Medication Dose Route  Frequency Provider Last Rate Last Admin  . 0.9 %  sodium chloride infusion   Intravenous Continuous Hooten, 08/31/2020, MD 10 mL/hr at 08/29/20 08/31/20 Infusion Verify at 08/29/20 0624  . acetaminophen (TYLENOL) tablet 325-650 mg  325-650 mg Oral Q6H PRN Hooten, 08/31/20, MD      . alum & mag hydroxide-simeth (MAALOX/MYLANTA) 200-200-20 MG/5ML suspension 30 mL  30 mL Oral Q4H PRN Hooten, 11-10-2000, MD      . bisacodyl (DULCOLAX) suppository 10 mg  10 mg Rectal Daily PRN Hooten, Illene Labrador, MD      . calcium-vitamin D 500-200 MG-UNIT per tablet 1 tablet  1 tablet Oral Q breakfast Hooten, Illene Labrador, MD      . celecoxib (CELEBREX) capsule 200 mg  200 mg Oral BID Illene Labrador, MD   200 mg at 08/29/20 0854  . diphenhydrAMINE (BENADRYL) 12.5 MG/5ML elixir 12.5-25 mg  12.5-25 mg Oral Q4H PRN Hooten, 06-03-1984, MD      . enoxaparin (LOVENOX) injection 30 mg  30 mg Subcutaneous Q12H Hooten, Illene Labrador, MD   30 mg at 08/29/20 0855  . ferrous sulfate tablet 325 mg  325 mg Oral BID WC Hooten, 08/31/20, MD   325 mg at 08/29/20 0854  . HYDROmorphone (DILAUDID) injection 0.5-1 mg  0.5-1 mg Intravenous Q4H PRN Hooten, 08/31/20, MD      . magnesium hydroxide (MILK OF  MAGNESIA) suspension 30 mL  30 mL Oral Daily Hooten, Illene Labrador, MD   30 mL at 08/28/20 1813  . menthol-cetylpyridinium (CEPACOL) lozenge 3 mg  1 lozenge Oral PRN Hooten, Illene Labrador, MD       Or  . phenol (CHLORASEPTIC) mouth spray 1 spray  1 spray Mouth/Throat PRN Hooten, Illene Labrador, MD      . metoCLOPramide (REGLAN) tablet 10 mg  10 mg Oral TID AC & HS Hooten, Illene Labrador, MD   10 mg at 08/29/20 0853  . multivitamin with minerals tablet 1 tablet  1 tablet Oral Daily Hooten, Illene Labrador, MD   1 tablet at 08/29/20 0853  . ondansetron (ZOFRAN) tablet 4 mg  4 mg Oral Q6H PRN Hooten, Illene Labrador, MD       Or  . ondansetron (ZOFRAN) injection 4 mg  4 mg Intravenous Q6H PRN Hooten, Illene Labrador, MD      . oxyCODONE (Oxy IR/ROXICODONE) immediate release tablet 10 mg  10 mg Oral Q4H PRN Donato Heinz, MD   10 mg at 08/29/20 0708  . oxyCODONE (Oxy IR/ROXICODONE) immediate release tablet 5 mg  5 mg Oral Q4H PRN Hooten, Illene Labrador, MD   5 mg at 08/28/20 1812  . pantoprazole (PROTONIX) EC tablet 40 mg  40 mg Oral BID Donato Heinz, MD   40 mg at 08/29/20 0854  . senna-docusate (Senokot-S) tablet 1 tablet  1 tablet Oral BID Donato Heinz, MD   1 tablet at 08/29/20 0853  . sodium phosphate (FLEET) 7-19 GM/118ML enema 1 enema  1 enema Rectal Once PRN Hooten, Illene Labrador, MD      . traMADol Janean Sark) tablet 50-100 mg  50-100 mg Oral Q4H PRN Donato Heinz, MD   100 mg at 08/28/20 2152     Discharge Medications: Please see discharge summary for a list of discharge medications.  Relevant Imaging Results:  Relevant Lab Results:   Additional Information NKDA  Barrie Dunker, RN

## 2020-08-29 NOTE — Evaluation (Signed)
Physical Therapy Evaluation Patient Details Name: Dawn Clark MRN: [id number] DOB: [birth year] Today's Date: 08/29/2020   History of Present Illness  87 y/o female s/p R TKA, h/o L TKA 3 years ago.  Clinical Impression  Pt reports that pain was elevated this morning, but feeling much better at time of PT exam having gotten pain meds.  She did relatively well with PT exam; however she had issues with lightheadedness (seemingly orthostatic), especially on her first round of standing.  She showed good effort with exercises and was able to do SLRs with good confidence, Pt had AROM to mid 70s and with increased reps and gentle PROM to mid 80s.  Pt with slow and guarded gait, but did improve with increased distance/cuing and ultimately did manage ~50 ft with the walker.    Follow Up Recommendations SNF;Follow surgeon's recommendation for DC plan and follow-up therapies    Equipment Recommendations   (TBD at next venue of care, pt does not think she has a walker at home)    Recommendations for Other Services       Precautions / Restrictions Precautions Precautions: Fall Restrictions Weight Bearing Restrictions: Yes RLE Weight Bearing: Weight bearing as tolerated      Mobility  Bed Mobility Overal bed mobility: Needs Assistance Bed Mobility: Supine to Sit;Sit to Supine     Supine to sit: Min guard Sit to supine: Min guard   General bed mobility comments: min ucing for hand placement and technique    Transfers Overall transfer level: Needs assistance Equipment used: Rolling walker (2 wheeled) Transfers: Sit to/from [employer] Sit to Stand: Min guard Stand pivot transfers: Min guard       General transfer comment: Pt required repeated cuing and extra time to set up rise to standing.  She did have some initial lightheadedness in sitting that did not last long, however in standing she became much more symptomatic with unsteadiness on first standing attempt.   (BP, after return to sitting ~2 minutes was 120s/40s).  Pt did not require much phyiscal assist in getting to standing but due to apparent orthostatic symptoms PT very close guard and plenty of VCs/symptom check-ins  Ambulation/Gait Ambulation/Gait assistance: Min assist Gait Distance (Feet): 50 Feet Assistive device: Rolling walker (2 wheeled)       General Gait Details: Pt with slow but relatively safe gait.  She did not have any buckling/stagger steps but did show some hesitancy with L WBing (especially first ~15 ft).  She reported some lightheadedness during ambulation but nothing like her initial symptoms the first time standing up  Stairs            Wheelchair Mobility    Modified Rankin (Stroke Patients Only)       Balance Overall balance assessment: Needs assistance Sitting-balance support: Feet supported Sitting balance-Leahy Scale: Good     Standing balance support: During functional activity Standing balance-Leahy Scale: Fair Standing balance comment: Pt with apparent orthostatic symptoms with staggering/LOBs on intial attempt at standing.  Better on next attempt, though guarded and more cautious                             Pertinent Vitals/Pain Pain Assessment: 0-10 Pain Score: 3  Pain Location: R knee Pain Descriptors / Indicators: Discomfort;Aching Pain Intervention(s): Limited activity within patient's tolerance;Monitored during session;Repositioned    Home Living Family/patient expects to be discharged to:: Skilled nursing facility Living Arrangements: Alone  Prior Function Level of Independence: Independent;Independent with assistive device(s)         Comments: Pt drives and runs her errands, able to manage her home w/o assist, though family does live close and can/does check in when needed. She has been using SPC prior to surgery for increased stability.     Hand Dominance   Dominant Hand: Right     Extremity/Trunk Assessment   Upper Extremity Assessment Upper Extremity Assessment: Generalized weakness;Overall [facility] for tasks assessed    Lower Extremity Assessment Lower Extremity Assessment: Overall WFL for tasks assessed;Generalized weakness       Communication   Communication: No difficulties  Cognition Arousal/Alertness: Awake/alert Behavior During Therapy: WFL for tasks assessed/performed Overall Cognitive Status: Within Functional Limits for tasks assessed                                        General Comments      Exercises Total Joint Exercises Ankle Circles/Pumps: AROM;10 reps Quad Sets: Strengthening;10 reps Short Arc Quad: AROM;10 reps Heel Slides: AROM;10 reps (with lightly resisted leg extensions) Hip ABduction/ADduction: AROM;Strengthening;10 reps Straight Leg Raises: AROM;10 reps Knee Flexion: PROM;5 reps Goniometric ROM: 0-86   Assessment/Plan    PT Assessment Patient needs continued PT services  PT Problem List Decreased strength;Decreased range of motion;Decreased activity tolerance;Decreased balance;Decreased mobility;Decreased coordination;Decreased knowledge of use of DME;Decreased safety awareness;Pain       PT Treatment Interventions DME instruction;Gait training;Stair training;Functional mobility training;Therapeutic activities;Therapeutic exercise;Balance training;Patient/family education;Neuromuscular re-education    PT Goals (Current goals can be found in the Care Plan section)  Acute Rehab PT Goals Patient Stated Goal: to get stronger PT Goal Formulation: With patient Time For Goal Achievement: 09/12/20 Potential to Achieve Goals: Good    Frequency BID   Barriers to discharge        Co-evaluation               AM-PAC PT "6 Clicks" Mobility  Outcome Measure Help needed turning from your back to your side while in a flat bed without using bedrails?: None Help needed moving from lying on your back to  sitting on the side of a flat bed without using bedrails?: None Help needed moving to and from a bed to a chair (including a wheelchair)?: A Little Help needed standing up from a chair using your arms (e.g., wheelchair or bedside chair)?: A Little Help needed to walk in hospital room?: A Little Help needed climbing 3-5 steps with a railing? : A Lot 6 Click Score: 19    End of Session Equipment Utilized During Treatment: Gait belt Activity Tolerance: Patient tolerated treatment well;Patient limited by pain Patient left: with chair alarm set;with call bell/phone within reach Nurse Communication: Mobility status PT Visit Diagnosis: Muscle weakness (generalized) (M62.81);Difficulty in walking, not elsewhere classified (R26.2);Pain Pain - Right/Left: Right Pain - part of body: Knee    Time: 7939-0300 PT Time Calculation (min) (ACUTE ONLY): 47 min   Charges:   PT Evaluation $PT Eval Low Complexity: 1 Low PT Treatments $Gait Training: 8-22 mins $Therapeutic Exercise: 8-22 mins $Therapeutic Activity: 8-22 mins        Malachi Pro, DPT 08/29/2020, 11:45 AM

## 2020-08-29 NOTE — Plan of Care (Signed)
Doing great.

## 2020-08-30 MED ORDER — TRAMADOL HCL 50 MG PO TABS: 50.0000 mg | ORAL_TABLET | ORAL | 0 refills | Status: DC | PRN

## 2020-08-30 MED ORDER — CELECOXIB 200 MG PO CAPS: 200.0000 mg | ORAL_CAPSULE | Freq: Two times a day (BID) | ORAL | 0 refills | Status: DC

## 2020-08-30 MED ORDER — ENOXAPARIN SODIUM 40 MG/0.4ML IJ SOSY: 40.0000 mg | PREFILLED_SYRINGE | INTRAMUSCULAR | 0 refills | Status: DC

## 2020-08-30 NOTE — Progress Notes (Signed)
Discharge: Patient Discharge instructions discussed with family and patient at bedside by previous nurse. This nurse assisted patient and family with packing belongings and transferred patient to exit via wheelchair. Patient transferred self into car from wheelchair without difficulty. Patient stable for discharge at this time.

## 2020-08-30 NOTE — Progress Notes (Signed)
  Subjective: 2 Days Post-Op Procedure(s) (LRB): COMPUTER ASSISTED TOTAL KNEE ARTHROPLASTY (Right) Patient reports pain as well-controlled.   Patient is well, and has had no acute complaints or problems Plan is to go Home after hospital stay. Negative for chest pain and shortness of breath Fever: no Gastrointestinal: negative for nausea and vomiting.  Patient has had a bowel movement.  Objective: Vital signs in last 24 hours: Temp:  [97.6 F (36.4 C)-98.1 F (36.7 C)] 97.7 F (36.5 C) (04/29 0757) Pulse Rate:  [72-78] 78 (04/29 0757) Resp:  [16-20] 17 (04/29 0757) BP: (135-159)/(57-72) 149/72 (04/29 0757) SpO2:  [94 %-100 %] 94 % (04/29 0757)  Intake/Output from previous day:  Intake/Output Summary (Last 24 hours) at 08/30/2020 0915 Last data filed at 08/30/2020 0631 Gross per 24 hour  Intake 1116.54 ml  Output 175 ml  Net 941.54 ml    Intake/Output this shift: No intake/output data recorded.  Labs: No results for input(s): HGB in the last 72 hours. No results for input(s): WBC, RBC, HCT, PLT in the last 72 hours. No results for input(s): NA, K, CL, CO2, BUN, CREATININE, GLUCOSE, CALCIUM in the last 72 hours. No results for input(s): LABPT, INR in the last 72 hours.   EXAM General - Patient is Alert, Appropriate and Oriented Extremity - Neurovascular intact Dorsiflexion/Plantar flexion intact Compartment soft Dressing/Incision -Postoperative dressing remains in place., Polar Care in place and working. , Hemovac in place. Following post op dressing removal, honeycomb dressing was noted to be saturated with blood. Motor Function - intact, moving foot and toes well on exam.  Cardiovascular- Regular rate and rhythm, no murmurs/rubs/gallops Respiratory- Lungs clear to auscultation bilaterally Gastrointestinal- soft, nontender and active bowel sounds   Assessment/Plan: 2 Days Post-Op Procedure(s) (LRB): COMPUTER ASSISTED TOTAL KNEE ARTHROPLASTY (Right) Active  Problems:   Total knee replacement status  Estimated body mass index is 26.16 kg/m as calculated from the following:   Height as of this encounter: 5\' 2"  (1.575 m).   Weight as of this encounter: 64.9 kg. Advance diet Up with therapy  Plan is currently for d/c to SNF, but patient expresses desire to go home if possible. Advised we would see how she does with PT, but if she is unable to safely go home, a rehab facility would be her best option.   Fresh honeycomb dressing applied. Hemovac removed and mini compression dressing applied.  DVT Prophylaxis - Lovenox, Ted hose and foot pumps Weight-Bearing as tolerated to right leg  , PA-C [facility] Orthopaedic Surgery 08/30/2020, 9:15 AM

## 2020-08-30 NOTE — TOC Progression Note (Signed)
Transition of Care [facility]) - Progression Note    Patient Details  Name: Dawn Clark MRN: [id number] Date of Birth: 11/15/1928  Transition of Care [facility]) CM/SW Contact  Barrie Dunker, RN Phone Number: 08/30/2020, 12:41 PM  Clinical Narrative:   Reviewed bed choices with the patient, She stated that she is going to go home with [facility] for home health, she will need a rolling walker for DC, I arranged [employer] to bring a RW to the room prior to DC, she is already set up with [facility] for [facility] by the physician office prior to surgery, no additional needs         Expected Discharge Plan and Services                                                 Social Determinants of Health (SDOH) Interventions    Readmission Risk Interventions No flowsheet data found.

## 2020-08-30 NOTE — TOC Progression Note (Signed)
Transition of Care [facility]) - Progression Note    Patient Details  Name: Dawn Clark MRN: [id number] Date of Birth: February 22, 1929  Transition of Care [facility]) CM/SW Contact  Barrie Dunker, RN Phone Number: 08/30/2020, 12:56 PM  Clinical Narrative:   Tessie Eke the patient's son to notify him that the plan will be for the patient to go home, He said that he totally agreed and family would check on her daily         Expected Discharge Plan and Services                                                 Social Determinants of Health (SDOH) Interventions    Readmission Risk Interventions No flowsheet data found.

## 2020-08-30 NOTE — Progress Notes (Signed)
Physical Therapy Treatment Patient Details Name: Dawn Clark MRN: [id number] DOB: 1928-12-07 Today's Date: 08/30/2020    History of Present Illness 87 y/o female s/p R TKA, h/o L TKA 3 years ago.    PT Comments    Pt received supine in bed upon arrival to room and agreeable to therapy.  Pt performed transfers with good technique and was able to increase gait speed along with adding head turns throughout the process of ambulation.  Pt with good strength and balance during PM session and updated recommendations for d/c are noted below due to progress made in the past 24 hours.  Pt has good mobility that can be coupled with support from local family to assist with transfers when needed.  Current discharge plans to HHPT are appropriate at this time.  Pt will continue to benefit from skilled therapy in order to address deficits listed below.     Follow Up Recommendations  Home health PT     Equipment Recommendations  Rolling walker with 5" wheels    Recommendations for Other Services       Precautions / Restrictions Precautions Precautions: Fall Restrictions Weight Bearing Restrictions: Yes RLE Weight Bearing: Weight bearing as tolerated    Mobility  Bed Mobility Overal bed mobility: Needs Assistance Bed Mobility: Supine to Sit;Sit to Supine     Supine to sit: Min guard     General bed mobility comments: No dizziness reported during this session.    Transfers Overall transfer level: Needs assistance Equipment used: Rolling walker (2 wheeled) Transfers: Sit to/from [employer] Sit to Stand: Min guard         General transfer comment: No difficulty today.  Ambulation/Gait Ambulation/Gait assistance: Min assist Gait Distance (Feet): 320 Feet Assistive device: Rolling walker (2 wheeled) Gait Pattern/deviations: Step-through pattern     General Gait Details: Pt with good speed during session this PM.   Stairs Stairs: Yes Stairs assistance:  Min guard Stair Management: One rail Right;Step to pattern;Sideways Number of Stairs: 4 General stair comments: Pt with good confidence following verbal and visual cuing.  Pt notes this is how she is used to going up the stairs.   Wheelchair Mobility    Modified Rankin (Stroke Patients Only)       Balance Overall balance assessment: Needs assistance Sitting-balance support: Feet supported Sitting balance-Leahy Scale: Good     Standing balance support: Bilateral upper extremity supported Standing balance-Leahy Scale: Good                              Cognition Arousal/Alertness: Awake/alert Behavior During Therapy: WFL for tasks assessed/performed Overall Cognitive Status: Within Functional Limits for tasks assessed                                        Exercises      General Comments        Pertinent Vitals/Pain Pain Assessment: 0-10 Pain Score: 3  Pain Location: R knee Pain Descriptors / Indicators: Discomfort;Aching Pain Intervention(s): Limited activity within patient's tolerance;Monitored during session;Repositioned    Home Living                      Prior Function            PT Goals (current goals can now be found in the care plan  section) Acute Rehab PT Goals Patient Stated Goal: to get stronger Progress towards PT goals: Progressing toward goals    Frequency    BID      PT Plan Current plan remains appropriate    Co-evaluation              AM-PAC PT "6 Clicks" Mobility   Outcome Measure  Help needed turning from your back to your side while in a flat bed without using bedrails?: None Help needed moving from lying on your back to sitting on the side of a flat bed without using bedrails?: None Help needed moving to and from a bed to a chair (including a wheelchair)?: A Little Help needed standing up from a chair using your arms (e.g., wheelchair or bedside chair)?: A Little Help needed to walk  in hospital room?: A Little Help needed climbing 3-5 steps with a railing? : A Little 6 Click Score: 20    End of Session Equipment Utilized During Treatment: Gait belt Activity Tolerance: Patient tolerated treatment well Patient left: with chair alarm set;with call bell/phone within reach;in chair Nurse Communication: Mobility status PT Visit Diagnosis: Muscle weakness (generalized) (M62.81);Difficulty in walking, not elsewhere classified (R26.2);Pain Pain - Right/Left: Right Pain - part of body: Knee     Time: 1696-7893 PT Time Calculation (min) (ACUTE ONLY): 28 min  Charges:  $Gait Training: 23-37 mins                     Nolon Bussing, PT, DPT 08/30/20, 4:21 PM    Phineas Real 08/30/2020, 4:19 PM

## 2020-08-30 NOTE — Progress Notes (Signed)
Physical Therapy Treatment Patient Details Name: Dawn Clark MRN: [id number] DOB: 12/05/1928 Today's Date: 08/30/2020    History of Present Illness 87 y/o female s/p R TKA, h/o L TKA 3 years ago.    PT Comments    Pt received upright in recliner upon arrival to room and agreeable to therapy.  Pt noted she was feeling good today with minimal pain (3/10).  Pt notes the most significant portion of her pain comes from being in the bone foam.  Pt educated and encouraged to continue to utilize the bone foam for knee extension healing going forward.  Pt was able to perform sit to stand with use of FWW with CGA.  Pt then able to ambulate around the nursing station 1.5x before attempting stair training in the rehab gym.  Pt given seated rest break and was able to perform with good technique and good recall.  Pt then ambulated back to room and was left in recliner with all needs met and call bell/phone within reach.  Current discharge plans to SNF remain appropriate at this time.  Pt will continue to benefit from skilled therapy in order to address deficits listed below.     Follow Up Recommendations  SNF     Equipment Recommendations   (TBD at next venue of care)    Recommendations for Other Services       Precautions / Restrictions Precautions Precautions: Fall Restrictions Weight Bearing Restrictions: Yes RLE Weight Bearing: Weight bearing as tolerated    Mobility  Bed Mobility Overal bed mobility: Needs Assistance Bed Mobility: Supine to Sit;Sit to Supine     Supine to sit: Min guard     General bed mobility comments: No dizziness reported during this session.    Transfers Overall transfer level: Needs assistance Equipment used: Rolling walker (2 wheeled) Transfers: Sit to/from [employer] Sit to Stand: Min guard         General transfer comment: No difficulty today.  Ambulation/Gait Ambulation/Gait assistance: Min assist Gait Distance (Feet): 360  Feet Assistive device: Rolling walker (2 wheeled) Gait Pattern/deviations: Step-through pattern     General Gait Details: Pt with good speed during session this AM.   Stairs Stairs: Yes Stairs assistance: Min guard Stair Management: One rail Right;Step to pattern;Sideways Number of Stairs: 4 General stair comments: Pt with good confidence following verbal and visual cuing.  Pt notes this is how she is used to going up the stairs.   Wheelchair Mobility    Modified Rankin (Stroke Patients Only)       Balance Overall balance assessment: Needs assistance Sitting-balance support: Feet supported Sitting balance-Leahy Scale: Good     Standing balance support: Bilateral upper extremity supported Standing balance-Leahy Scale: Good                              Cognition Arousal/Alertness: Awake/alert Behavior During Therapy: WFL for tasks assessed/performed Overall Cognitive Status: Within Functional Limits for tasks assessed                                        Exercises      General Comments        Pertinent Vitals/Pain Pain Assessment: 0-10 Pain Score: 3  Pain Location: R knee Pain Descriptors / Indicators: Discomfort;Aching Pain Intervention(s): Limited activity within patient's tolerance;Monitored during session;Repositioned;Ice applied  Home Living                      Prior Function            PT Goals (current goals can now be found in the care plan section) Acute Rehab PT Goals Patient Stated Goal: to get stronger Progress towards PT goals: Progressing toward goals    Frequency    BID      PT Plan Current plan remains appropriate    Co-evaluation              AM-PAC PT "6 Clicks" Mobility   Outcome Measure  Help needed turning from your back to your side while in a flat bed without using bedrails?: None Help needed moving from lying on your back to sitting on the side of a flat bed without  using bedrails?: None Help needed moving to and from a bed to a chair (including a wheelchair)?: A Little Help needed standing up from a chair using your arms (e.g., wheelchair or bedside chair)?: A Little Help needed to walk in hospital room?: A Little Help needed climbing 3-5 steps with a railing? : A Little 6 Click Score: 20    End of Session Equipment Utilized During Treatment: Gait belt Activity Tolerance: Patient tolerated treatment well Patient left: with chair alarm set;with call bell/phone within reach;in chair Nurse Communication: Mobility status PT Visit Diagnosis: Muscle weakness (generalized) (M62.81);Difficulty in walking, not elsewhere classified (R26.2);Pain Pain - Right/Left: Right Pain - part of body: Knee     Time: 1025-1053 PT Time Calculation (min) (ACUTE ONLY): 28 min  Charges:  $Gait Training: 23-37 mins                      , PT, DPT 08/30/20, 12:58 PM     W  08/30/2020, 12:53 PM   

## 2020-08-30 NOTE — Discharge Summary (Signed)
Physician Discharge Summary  Patient ID: Dawn Clark MRN: [id number] DOB/AGE: 85/26/1930 85 y.o.  Admit date: 08/28/2020 Discharge date: 08/30/2020  Admission Diagnoses:  Total knee replacement status [Z96.659]  Surgeries:Procedure(s): Right total knee arthroplasty using computer-assisted navigation  SURGEON:  Jena Gauss. M.D.  ASSISTANT: Baldwin Jamaica, PA-C (present and scrubbed throughout the case, critical for assistance with exposure, retraction, instrumentation, and closure)  ANESTHESIA: spinal  ESTIMATED BLOOD LOSS: 50 mL  FLUIDS REPLACED: 1400 mL of crystalloid  TOURNIQUET TIME: 78 minutes  DRAINS: 2 medium Hemovac drains  SOFT TISSUE RELEASES: Anterior cruciate ligament, posterior cruciate ligament, deep medial collateral ligament, patellofemoral ligament  IMPLANTS UTILIZED: DePuy Attune size 4N posterior stabilized femoral component (cemented), size 4 rotating platform tibial component (cemented), 35 mm medialized dome patella (cemented), and a 5 mm stabilized rotating platform polyethylene insert.  Discharge Diagnoses: Patient Active Problem List   Diagnosis Date Noted  . Total knee replacement status 08/28/2020  . Primary osteoarthritis of right knee 04/13/2020  . Age-related osteoporosis without current pathological fracture 06/10/2017  . Status post total left knee replacement 06/07/2017    Past Medical History:  Diagnosis Date  . Arthritis   . Chicken pox   . Degenerative arthritis of right knee 07/2020  . Osteoporosis   . Vertigo    x1 over 2 yrs     Transfusion:    Consultants (if any):   Discharged Condition: Improved  Hospital Course: Dawn Clark is an 87 y.o. female who was admitted 08/28/2020 with a diagnosis of right knee osteoarthritis and went to the operating room on 08/28/2020 and underwent right total knee arthroplasty. The patient received perioperative antibiotics for prophylaxis (see below). The patient tolerated the  procedure well and was transported to PACU in stable condition. After meeting PACU criteria, the patient was subsequently transferred to the Orthopaedics/Rehabilitation unit.   The patient received DVT prophylaxis in the form of early mobilization, Lovenox, Foot Pumps and TED hose. A sacral pad had been placed and heels were elevated off of the bed with rolled towels in order to protect skin integrity. Foley catheter was discontinued on postoperative day #0. Wound drains were discontinued on postoperative day #2. The surgical incision was healing well without signs of infection.  Physical therapy was initiated postoperatively for transfers, gait training, and strengthening. Occupational therapy was initiated for activities of daily living and evaluation for assisted devices. Rehabilitation goals were reviewed in detail with the patient. The patient made steady progress with physical therapy and physical therapy recommended discharge to Home.   The patient achieved the preliminary goals of this hospitalization and was felt to be medically and orthopaedically appropriate for discharge.  She was given perioperative antibiotics:  Anti-infectives (From admission, onward)   Start     Dose/Rate Route Frequency Ordered Stop   08/28/20 1800  ceFAZolin (ANCEF) IVPB 2g/100 mL premix        2 g 200 mL/hr over 30 Minutes Intravenous Every 6 hours 08/28/20 1604 08/29/20 0038   08/28/20 1010  ceFAZolin (ANCEF) 2-4 GM/100ML-% IVPB       Note to Pharmacy: Brain Hilts   : cabinet override      08/28/20 1010 08/28/20 1904   08/28/20 0600  ceFAZolin (ANCEF) IVPB 2g/100 mL premix        2 g 200 mL/hr over 30 Minutes Intravenous On call to O.R. 08/27/20 2311 08/28/20 1205    .  Recent vital signs:  Vitals:   08/30/20 0540 08/30/20 0757  BP: 137/69 (!) 149/72  Pulse: 78 78  Resp: 20 17  Temp: 98.1 F (36.7 C) 97.7 F (36.5 C)  SpO2: 100% 94%    Recent laboratory studies:  No results for input(s):  WBC, HGB, HCT, PLT, K, CL, CO2, BUN, CREATININE, GLUCOSE, CALCIUM, LABPT, INR in the last 72 hours.  Diagnostic Studies: DG Knee Right Port  Result Date: 08/28/2020 CLINICAL DATA:  Status post right knee arthroplasty. EXAM: PORTABLE RIGHT KNEE - 1-2 VIEW COMPARISON:  None. FINDINGS: The right femoral and tibial components are well situated. Surgical drain is noted in the soft tissues anterior to the distal femur. Expected postoperative changes are noted in the soft tissues anteriorly. IMPRESSION: Status post right total knee arthroplasty. Electronically Signed   By: Lupita Raider M.D.   On: 08/28/2020 15:37    Discharge Medications:   Allergies as of 08/30/2020   No Known Allergies     Medication List    STOP taking these medications   aspirin EC 81 MG tablet     TAKE these medications   acetaminophen 650 MG CR tablet Commonly known as: TYLENOL Take 650 mg by mouth every 8 (eight) hours as needed for pain.   alendronate 70 MG tablet Commonly known as: FOSAMAX Take 70 mg by mouth every Monday. In the morning.Take with a full glass of water on an empty stomach.   amoxicillin 500 MG capsule Commonly known as: AMOXIL Take 2,000 mg by mouth once. 1 hour prior to dental appts   CALCIUM 600+D3 PO Take 1 tablet by mouth daily.   celecoxib 200 MG capsule Commonly known as: CELEBREX Take 1 capsule (200 mg total) by mouth 2 (two) times daily.   Centrum Silver tablet Take 1 tablet by mouth daily.   enoxaparin 40 MG/0.4ML injection Commonly known as: LOVENOX Inject 0.4 mLs (40 mg total) into the skin daily for 14 days.   NONFORMULARY OR COMPOUNDED ITEM Apply 1 Pump topically 4 (four) times daily as needed (toe joints). Baclofen 2% Diclofenac 5% Gabapentin 6% Tetracaine 3%   traMADol 50 MG tablet Commonly known as: ULTRAM Take 1 tablet (50 mg total) by mouth every 4 (four) hours as needed for moderate pain.            Durable Medical Equipment  (From admission, onward)          Start     Ordered   08/28/20 1604  DME Walker rolling  Once       Question:  Patient needs a walker to treat with the following condition  Answer:  Total knee replacement status   08/28/20 1604   08/28/20 1604  DME Bedside commode  Once       Question:  Patient needs a bedside commode to treat with the following condition  Answer:  Total knee replacement status   08/28/20 1604          Disposition: Home with home health PT     Follow-up Information    Myrtis Ser On 09/12/2020.   Specialty: Orthopedic Surgery Why: at 1:45pm Contact information: [address] [facility] [place] [facility] [place] [address] [phone]        Donato Heinz, MD On 10/15/2020.   Specialty: Orthopedic Surgery Why: at 3:00pm Contact information: [address] [facility] [address] [facility] [place] [address] [phone]                Lasandra Beech, PA-C 08/30/2020, 2:38 PM

## 2020-08-30 NOTE — TOC Progression Note (Signed)
Transition of Care [facility]) - Progression Note    Patient Details  Name: Dawn Clark MRN: [id number] Date of Birth: 1928-09-24  [facility] CM/SW Contact  Barrie Dunker, RN Phone Number: 08/30/2020, 11:28 AM  Clinical Narrative:   Reached out to pending SNF and requested they look at the patient for a potential bed offer, awaitng responce        Expected Discharge Plan and Services                                                 Social Determinants of Health (SDOH) Interventions    Readmission Risk Interventions No flowsheet data found.

## 2020-08-30 NOTE — Progress Notes (Signed)
Pt dressing changed due to increase in bloody drainage. PT tolerated well, new honeycomb applied.

## 2020-12-04 ENCOUNTER — Other Ambulatory Visit: Payer: Self-pay | Admitting: Internal Medicine

## 2020-12-04 DIAGNOSIS — M810 Age-related osteoporosis without current pathological fracture: Secondary | ICD-10-CM

## 2020-12-30 ENCOUNTER — Ambulatory Visit
Admission: RE | Admit: 2020-12-30 | Discharge: 2020-12-30 | Disposition: A | Discharge status: Home / Self Care | Payer: Medicare HMO | Source: Ambulatory Visit | Attending: Internal Medicine | Admitting: Internal Medicine

## 2020-12-30 ENCOUNTER — Other Ambulatory Visit: Payer: Self-pay

## 2020-12-30 DIAGNOSIS — M810 Age-related osteoporosis without current pathological fracture: Secondary | ICD-10-CM | POA: Insufficient documentation

## 2021-05-02 ENCOUNTER — Other Ambulatory Visit: Payer: Self-pay | Admitting: Internal Medicine

## 2021-05-02 ENCOUNTER — Other Ambulatory Visit (HOSPITAL_COMMUNITY): Payer: Self-pay | Admitting: Internal Medicine

## 2021-05-02 DIAGNOSIS — R42 Dizziness and giddiness: Secondary | ICD-10-CM

## 2021-05-02 DIAGNOSIS — R519 Headache, unspecified: Secondary | ICD-10-CM

## 2021-05-06 ENCOUNTER — Other Ambulatory Visit: Payer: Self-pay | Admitting: Internal Medicine

## 2021-05-06 DIAGNOSIS — R42 Dizziness and giddiness: Secondary | ICD-10-CM

## 2021-05-08 ENCOUNTER — Ambulatory Visit
Admission: RE | Admit: 2021-05-08 | Discharge: 2021-05-08 | Disposition: A | Discharge status: Home / Self Care | Payer: Medicare HMO | Source: Ambulatory Visit | Attending: Internal Medicine | Admitting: Internal Medicine

## 2021-05-08 ENCOUNTER — Other Ambulatory Visit: Payer: Self-pay

## 2021-05-08 DIAGNOSIS — R42 Dizziness and giddiness: Secondary | ICD-10-CM | POA: Diagnosis present

## 2021-05-08 DIAGNOSIS — R519 Headache, unspecified: Secondary | ICD-10-CM | POA: Diagnosis present

## 2022-02-24 ENCOUNTER — Inpatient Hospital Stay
Admit: 2022-02-24 | Discharge: 2022-02-24 | Disposition: A | Discharge status: Home / Self Care | Payer: Medicare HMO | Attending: Internal Medicine | Admitting: Internal Medicine

## 2022-02-24 ENCOUNTER — Inpatient Hospital Stay
Admission: EM | Admit: 2022-02-24 | Discharge: 2022-04-03 | DRG: 321 | Disposition: E | Payer: Medicare HMO | Attending: Internal Medicine | Admitting: Internal Medicine

## 2022-02-24 ENCOUNTER — Encounter: Payer: Self-pay | Admitting: Emergency Medicine

## 2022-02-24 ENCOUNTER — Other Ambulatory Visit: Payer: Self-pay

## 2022-02-24 ENCOUNTER — Inpatient Hospital Stay (HOSPITAL_COMMUNITY)
Admit: 2022-02-24 | Discharge: 2022-02-24 | Disposition: A | Discharge status: Home / Self Care | Payer: Medicare HMO | Attending: Internal Medicine | Admitting: Internal Medicine

## 2022-02-24 ENCOUNTER — Emergency Department: Payer: Medicare HMO

## 2022-02-24 ENCOUNTER — Encounter: Admission: EM | Disposition: E | Payer: Self-pay | Source: Home / Self Care | Attending: Family Medicine

## 2022-02-24 ENCOUNTER — Inpatient Hospital Stay: Payer: Medicare HMO

## 2022-02-24 DIAGNOSIS — M810 Age-related osteoporosis without current pathological fracture: Secondary | ICD-10-CM | POA: Diagnosis present

## 2022-02-24 DIAGNOSIS — R109 Unspecified abdominal pain: Secondary | ICD-10-CM | POA: Diagnosis not present

## 2022-02-24 DIAGNOSIS — I4891 Unspecified atrial fibrillation: Secondary | ICD-10-CM

## 2022-02-24 DIAGNOSIS — W109XXA Fall (on) (from) unspecified stairs and steps, initial encounter: Secondary | ICD-10-CM | POA: Diagnosis present

## 2022-02-24 DIAGNOSIS — I213 ST elevation (STEMI) myocardial infarction of unspecified site: Secondary | ICD-10-CM

## 2022-02-24 DIAGNOSIS — S069X1A Unspecified intracranial injury with loss of consciousness of 30 minutes or less, initial encounter: Secondary | ICD-10-CM | POA: Diagnosis present

## 2022-02-24 DIAGNOSIS — Z8249 Family history of ischemic heart disease and other diseases of the circulatory system: Secondary | ICD-10-CM

## 2022-02-24 DIAGNOSIS — I4729 Other ventricular tachycardia: Secondary | ICD-10-CM

## 2022-02-24 DIAGNOSIS — I480 Paroxysmal atrial fibrillation: Secondary | ICD-10-CM | POA: Diagnosis present

## 2022-02-24 DIAGNOSIS — R0682 Tachypnea, not elsewhere classified: Secondary | ICD-10-CM | POA: Diagnosis not present

## 2022-02-24 DIAGNOSIS — N179 Acute kidney failure, unspecified: Secondary | ICD-10-CM | POA: Diagnosis present

## 2022-02-24 DIAGNOSIS — R42 Dizziness and giddiness: Secondary | ICD-10-CM | POA: Diagnosis present

## 2022-02-24 DIAGNOSIS — R570 Cardiogenic shock: Secondary | ICD-10-CM | POA: Diagnosis not present

## 2022-02-24 DIAGNOSIS — I472 Ventricular tachycardia, unspecified: Secondary | ICD-10-CM | POA: Diagnosis present

## 2022-02-24 DIAGNOSIS — Z515 Encounter for palliative care: Secondary | ICD-10-CM

## 2022-02-24 DIAGNOSIS — I447 Left bundle-branch block, unspecified: Secondary | ICD-10-CM | POA: Diagnosis present

## 2022-02-24 DIAGNOSIS — I2102 ST elevation (STEMI) myocardial infarction involving left anterior descending coronary artery: Principal | ICD-10-CM

## 2022-02-24 DIAGNOSIS — I959 Hypotension, unspecified: Secondary | ICD-10-CM | POA: Diagnosis not present

## 2022-02-24 DIAGNOSIS — Z90710 Acquired absence of both cervix and uterus: Secondary | ICD-10-CM

## 2022-02-24 DIAGNOSIS — Z66 Do not resuscitate: Secondary | ICD-10-CM | POA: Diagnosis not present

## 2022-02-24 DIAGNOSIS — I2510 Atherosclerotic heart disease of native coronary artery without angina pectoris: Secondary | ICD-10-CM | POA: Diagnosis present

## 2022-02-24 DIAGNOSIS — D72829 Elevated white blood cell count, unspecified: Secondary | ICD-10-CM | POA: Diagnosis present

## 2022-02-24 DIAGNOSIS — I471 Supraventricular tachycardia, unspecified: Secondary | ICD-10-CM | POA: Diagnosis present

## 2022-02-24 DIAGNOSIS — M1711 Unilateral primary osteoarthritis, right knee: Secondary | ICD-10-CM | POA: Diagnosis present

## 2022-02-24 DIAGNOSIS — R0902 Hypoxemia: Secondary | ICD-10-CM | POA: Diagnosis present

## 2022-02-24 DIAGNOSIS — Z7189 Other specified counseling: Secondary | ICD-10-CM | POA: Diagnosis not present

## 2022-02-24 DIAGNOSIS — Z79899 Other long term (current) drug therapy: Secondary | ICD-10-CM

## 2022-02-24 DIAGNOSIS — R531 Weakness: Secondary | ICD-10-CM | POA: Diagnosis present

## 2022-02-24 DIAGNOSIS — Z7983 Long term (current) use of bisphosphonates: Secondary | ICD-10-CM | POA: Diagnosis not present

## 2022-02-24 DIAGNOSIS — Z96653 Presence of artificial knee joint, bilateral: Secondary | ICD-10-CM | POA: Diagnosis present

## 2022-02-24 DIAGNOSIS — I5021 Acute systolic (congestive) heart failure: Secondary | ICD-10-CM | POA: Diagnosis present

## 2022-02-24 DIAGNOSIS — Z791 Long term (current) use of non-steroidal anti-inflammatories (NSAID): Secondary | ICD-10-CM

## 2022-02-24 DIAGNOSIS — I255 Ischemic cardiomyopathy: Secondary | ICD-10-CM | POA: Diagnosis present

## 2022-02-24 DIAGNOSIS — R7402 Elevation of levels of lactic acid dehydrogenase (LDH): Secondary | ICD-10-CM | POA: Diagnosis not present

## 2022-02-24 HISTORY — PX: CORONARY/GRAFT ACUTE MI REVASCULARIZATION: CATH118305

## 2022-02-24 HISTORY — PX: LEFT HEART CATH AND CORONARY ANGIOGRAPHY: CATH118249

## 2022-02-24 LAB — TROPONIN I (HIGH SENSITIVITY)
Troponin I (High Sensitivity): 11143 ng/L (ref <18)
Troponin I (High Sensitivity): >24000 ng/L (ref <18)

## 2022-02-24 LAB — GLUCOSE, CAPILLARY: Glucose-Capillary: 128 mg/dL — ABNORMAL HIGH (ref 70–99)

## 2022-02-24 LAB — CBC WITH DIFFERENTIAL/PLATELET
Abs Immature Granulocytes: 0.05 10*3/uL (ref 0.00–0.07)
Basophils Absolute: 0.1 10*3/uL (ref 0.0–0.1)
Basophils Relative: 0 %
Eosinophils Absolute: 0 10*3/uL (ref 0.0–0.5)
Eosinophils Relative: 0 %
HCT: 49.6 % — ABNORMAL HIGH (ref 36.0–46.0)
Hemoglobin: 16.4 g/dL — ABNORMAL HIGH (ref 12.0–15.0)
Immature Granulocytes: 0 %
Lymphocytes Relative: 14 %
Lymphs Abs: 2 10*3/uL (ref 0.7–4.0)
MCH: 32 pg (ref 26.0–34.0)
MCHC: 33.1 g/dL (ref 30.0–36.0)
MCV: 96.7 fL (ref 80.0–100.0)
Monocytes Absolute: 1 10*3/uL (ref 0.1–1.0)
Monocytes Relative: 7 %
Neutro Abs: 10.9 10*3/uL — ABNORMAL HIGH (ref 1.7–7.7)
Neutrophils Relative %: 79 %
Platelets: 238 10*3/uL (ref 150–400)
RBC: 5.13 MIL/uL — ABNORMAL HIGH (ref 3.87–5.11)
RDW: 12.4 % (ref 11.5–15.5)
WBC: 14.1 10*3/uL — ABNORMAL HIGH (ref 4.0–10.5)
nRBC: 0 % (ref 0.0–0.2)

## 2022-02-24 LAB — COMPREHENSIVE METABOLIC PANEL
ALT: 35 U/L (ref 0–44)
AST: 151 U/L — ABNORMAL HIGH (ref 15–41)
Albumin: 3.7 g/dL (ref 3.5–5.0)
Alkaline Phosphatase: 45 U/L (ref 38–126)
Anion gap: 9 (ref 5–15)
BUN: 20 mg/dL (ref 8–23)
CO2: 24 mmol/L (ref 22–32)
Calcium: 8.6 mg/dL — ABNORMAL LOW (ref 8.9–10.3)
Chloride: 108 mmol/L (ref 98–111)
Creatinine, Ser: 1.02 mg/dL — ABNORMAL HIGH (ref 0.44–1.00)
GFR, Estimated: 52 mL/min — ABNORMAL LOW (ref >60)
Glucose, Bld: 162 mg/dL — ABNORMAL HIGH (ref 70–99)
Potassium: 3.7 mmol/L (ref 3.5–5.1)
Sodium: 141 mmol/L (ref 135–145)
Total Bilirubin: 0.7 mg/dL (ref 0.3–1.2)
Total Protein: 6.7 g/dL (ref 6.5–8.1)

## 2022-02-24 LAB — TSH: TSH: 5.113 u[IU]/mL — ABNORMAL HIGH (ref 0.350–4.500)

## 2022-02-24 LAB — MRSA NEXT GEN BY PCR, NASAL: MRSA by PCR Next Gen: NOT DETECTED

## 2022-02-24 LAB — CBG MONITORING, ED: Glucose-Capillary: 142 mg/dL — ABNORMAL HIGH (ref 70–99)

## 2022-02-24 LAB — POCT ACTIVATED CLOTTING TIME
Activated Clotting Time: 293 seconds
Activated Clotting Time: 299 seconds

## 2022-02-24 LAB — MAGNESIUM: Magnesium: 2 mg/dL (ref 1.7–2.4)

## 2022-02-24 SURGERY — CORONARY/GRAFT ACUTE MI REVASCULARIZATION
Anesthesia: Moderate Sedation

## 2022-02-24 MED ORDER — FUROSEMIDE 10 MG/ML IJ SOLN
40.0000 mg | Freq: Two times a day (BID) | INTRAMUSCULAR | Status: DC
  Administered 2022-02-24 – 2022-02-25 (×2): 40 mg via INTRAVENOUS
  Filled 2022-02-24 (×2): qty 4

## 2022-02-24 MED ORDER — METOPROLOL SUCCINATE ER 25 MG PO TB24
12.5000 mg | ORAL_TABLET | Freq: Every day | ORAL | Status: DC
  Filled 2022-02-24: qty 0.5

## 2022-02-24 MED ORDER — LIDOCAINE HCL (PF) 1 % IJ SOLN
INTRAMUSCULAR | Status: DC | PRN
  Administered 2022-02-24: 7 mL
  Administered 2022-02-24: 1 mL

## 2022-02-24 MED ORDER — IOHEXOL 300 MG/ML  SOLN
INTRAMUSCULAR | Status: DC | PRN
  Administered 2022-02-24: 110 mL

## 2022-02-24 MED ORDER — SODIUM CHLORIDE 0.9% FLUSH
3.0000 mL | Freq: Two times a day (BID) | INTRAVENOUS | Status: DC
  Administered 2022-02-24 – 2022-03-01 (×9): 3 mL via INTRAVENOUS

## 2022-02-24 MED ORDER — HEPARIN SODIUM (PORCINE) 1000 UNIT/ML IJ SOLN
INTRAMUSCULAR | Status: AC
  Filled 2022-02-24: qty 10

## 2022-02-24 MED ORDER — VERAPAMIL HCL 2.5 MG/ML IV SOLN
INTRAVENOUS | Status: AC
  Filled 2022-02-24: qty 2

## 2022-02-24 MED ORDER — SODIUM CHLORIDE 0.9 % IV SOLN
4.0000 ug/kg/min | INTRAVENOUS | Status: DC
  Filled 2022-02-24: qty 50

## 2022-02-24 MED ORDER — POTASSIUM CHLORIDE CRYS ER 20 MEQ PO TBCR
40.0000 meq | EXTENDED_RELEASE_TABLET | Freq: Once | ORAL | Status: DC
  Filled 2022-02-24: qty 2

## 2022-02-24 MED ORDER — SODIUM CHLORIDE 0.9% FLUSH: 3.0000 mL | INTRAVENOUS | Status: DC | PRN

## 2022-02-24 MED ORDER — ATORVASTATIN CALCIUM 20 MG PO TABS
80.0000 mg | ORAL_TABLET | Freq: Every day | ORAL | Status: DC
  Administered 2022-02-25 – 2022-03-01 (×5): 80 mg via ORAL
  Filled 2022-02-24 (×6): qty 4

## 2022-02-24 MED ORDER — ETOMIDATE 2 MG/ML IV SOLN
INTRAVENOUS | Status: AC
  Filled 2022-02-24: qty 10

## 2022-02-24 MED ORDER — HEPARIN SODIUM (PORCINE) 1000 UNIT/ML IJ SOLN
INTRAMUSCULAR | Status: DC | PRN
  Administered 2022-02-24 (×2): 3000 [IU] via INTRAVENOUS

## 2022-02-24 MED ORDER — TICAGRELOR 90 MG PO TABS
ORAL_TABLET | ORAL | Status: DC | PRN
  Administered 2022-02-24: 180 mg via ORAL

## 2022-02-24 MED ORDER — TICAGRELOR 90 MG PO TABS
ORAL_TABLET | ORAL | Status: AC
  Filled 2022-02-24: qty 2

## 2022-02-24 MED ORDER — CHLORHEXIDINE GLUCONATE CLOTH 2 % EX PADS
6.0000 | MEDICATED_PAD | Freq: Every day | CUTANEOUS | Status: DC
  Administered 2022-02-25 – 2022-02-28 (×4): 6 via TOPICAL

## 2022-02-24 MED ORDER — ETOMIDATE 2 MG/ML IV SOLN
INTRAVENOUS | Status: DC | PRN
  Administered 2022-02-24: 2.5 mg via INTRAVENOUS

## 2022-02-24 MED ORDER — PERFLUTREN LIPID MICROSPHERE
1.0000 mL | INTRAVENOUS | Status: AC | PRN
  Administered 2022-02-24: 3 mL via INTRAVENOUS

## 2022-02-24 MED ORDER — ONDANSETRON HCL 4 MG/2ML IJ SOLN: 4.0000 mg | Freq: Four times a day (QID) | INTRAMUSCULAR | Status: DC | PRN

## 2022-02-24 MED ORDER — VERAPAMIL HCL 2.5 MG/ML IV SOLN
INTRAVENOUS | Status: DC | PRN
  Administered 2022-02-24 (×2): 2.5 mg via INTRA_ARTERIAL

## 2022-02-24 MED ORDER — SODIUM CHLORIDE 0.9 % IV SOLN
INTRAVENOUS | Status: AC | PRN
  Administered 2022-02-24: 4 ug/kg/min via INTRAVENOUS

## 2022-02-24 MED ORDER — NITROGLYCERIN 1 MG/10 ML FOR IR/CATH LAB
INTRA_ARTERIAL | Status: DC | PRN
  Administered 2022-02-24 (×2): 200 ug via INTRACORONARY

## 2022-02-24 MED ORDER — SODIUM CHLORIDE 0.9 % IV SOLN: 250.0000 mL | INTRAVENOUS | Status: DC | PRN

## 2022-02-24 MED ORDER — LIDOCAINE HCL 1 % IJ SOLN
INTRAMUSCULAR | Status: AC
  Filled 2022-02-24: qty 20

## 2022-02-24 MED ORDER — HEPARIN (PORCINE) IN NACL 1000-0.9 UT/500ML-% IV SOLN
INTRAVENOUS | Status: DC | PRN
  Administered 2022-02-24: 1000 mL

## 2022-02-24 MED ORDER — CANGRELOR TETRASODIUM 50 MG IV SOLR
INTRAVENOUS | Status: AC
  Filled 2022-02-24: qty 50

## 2022-02-24 MED ORDER — FUROSEMIDE 10 MG/ML IJ SOLN
INTRAMUSCULAR | Status: DC | PRN
  Administered 2022-02-24: 40 mg via INTRAVENOUS

## 2022-02-24 MED ORDER — METOPROLOL TARTRATE 5 MG/5ML IV SOLN
5.0000 mg | Freq: Once | INTRAVENOUS | Status: AC
  Administered 2022-02-24: 5 mg via INTRAVENOUS
  Filled 2022-02-24: qty 5

## 2022-02-24 MED ORDER — FUROSEMIDE 10 MG/ML IJ SOLN
INTRAMUSCULAR | Status: AC
  Filled 2022-02-24: qty 4

## 2022-02-24 MED ORDER — HEPARIN (PORCINE) 25000 UT/250ML-% IV SOLN
900.0000 [IU]/h | INTRAVENOUS | Status: DC
  Administered 2022-02-24: 1000 [IU]/h via INTRAVENOUS
  Administered 2022-02-26: 750 [IU]/h via INTRAVENOUS
  Administered 2022-02-27: 950 [IU]/h via INTRAVENOUS
  Administered 2022-02-28: 900 [IU]/h via INTRAVENOUS
  Filled 2022-02-24 (×4): qty 250

## 2022-02-24 MED ORDER — ASPIRIN 81 MG PO CHEW
81.0000 mg | CHEWABLE_TABLET | Freq: Every day | ORAL | Status: DC
  Administered 2022-02-25 – 2022-03-01 (×5): 81 mg via ORAL
  Filled 2022-02-24 (×5): qty 1

## 2022-02-24 MED ORDER — TICAGRELOR 90 MG PO TABS
90.0000 mg | ORAL_TABLET | Freq: Two times a day (BID) | ORAL | Status: DC
  Administered 2022-02-24 – 2022-02-27 (×7): 90 mg via ORAL
  Filled 2022-02-24 (×8): qty 1

## 2022-02-24 MED ORDER — CANGRELOR BOLUS VIA INFUSION
INTRAVENOUS | Status: DC | PRN
  Administered 2022-02-24: 1902 ug via INTRAVENOUS

## 2022-02-24 MED ORDER — ACETAMINOPHEN 325 MG PO TABS
650.0000 mg | ORAL_TABLET | ORAL | Status: DC | PRN
  Administered 2022-02-27 (×2): 650 mg via ORAL
  Filled 2022-02-24 (×2): qty 2

## 2022-02-24 MED ORDER — HYDRALAZINE HCL 20 MG/ML IJ SOLN: 10.0000 mg | INTRAMUSCULAR | Status: AC | PRN

## 2022-02-24 SURGICAL SUPPLY — 22 items
BALLN TREK RX 2.5X12 (BALLOONS) ×1
BALLN ~~LOC~~ TREK NEO RX 2.75X12 (BALLOONS) IMPLANT
BALLOON TREK RX 2.5X12 (BALLOONS) IMPLANT
CANNULA 5F STIFF (CANNULA) IMPLANT
CATH INFINITI JR4 5F (CATHETERS) IMPLANT
CATH LAUNCHER 6FR EBU3.5 (CATHETERS) IMPLANT
DEVICE CLOSURE MYNXGRIP 6/7F (Vascular Products) IMPLANT
DEVICE RAD TR BAND REGULAR (VASCULAR PRODUCTS) IMPLANT
DRAPE BRACHIAL (DRAPES) IMPLANT
GLIDESHEATH SLEND SS 6F .021 (SHEATH) IMPLANT
GUIDEWIRE INQWIRE 1.5J.035X260 (WIRE) IMPLANT
INQWIRE 1.5J .035X260CM (WIRE) ×1
KIT ENCORE 26 ADVANTAGE (KITS) IMPLANT
PACK CARDIAC CATH (CUSTOM PROCEDURE TRAY) ×1 IMPLANT
PROTECTION STATION PRESSURIZED (MISCELLANEOUS) ×1
SET ATX SIMPLICITY (MISCELLANEOUS) IMPLANT
SHEATH AVANTI 6FR X 11CM (SHEATH) IMPLANT
STATION PROTECTION PRESSURIZED (MISCELLANEOUS) IMPLANT
STENT ONYX FRONTIER 2.5X15 (Permanent Stent) IMPLANT
WIRE G HI TQ BMW 190 (WIRE) IMPLANT
WIRE HITORQ VERSACORE ST 145CM (WIRE) IMPLANT
WIRE RUNTHROUGH .014X180CM (WIRE) IMPLANT

## 2022-02-24 NOTE — ED Notes (Signed)
Called to carelink/rep:sally/activated code stemi per MD Jessup.

## 2022-02-24 NOTE — Progress Notes (Signed)
*  PRELIMINARY RESULTS* Echocardiogram 2D Echocardiogram has been performed.  Dawn Clark 02/18/2022, 2:30 PM

## 2022-02-24 NOTE — Sedation Documentation (Addendum)
Sync cardioversion at this time 200j

## 2022-02-24 NOTE — ED Notes (Signed)
Given 324 asa by EMS

## 2022-02-24 NOTE — Progress Notes (Signed)
  Chaplain On-Call responded to Code STEMI notification to ED room 7 at 0935 hours.  The patient was attended by the medical team in preparation to transport to the CT Scan area, and then to the Cath Lab.  Chaplain is available for additional support if requested.  Chaplain Pollyann Samples M.Div., [facility]

## 2022-02-24 NOTE — ED Notes (Signed)
Pt taken to ct scan with MD End and MD Jessup. Then to cath.

## 2022-02-24 NOTE — Consult Note (Signed)
Cardiology Consultation:   Patient ID: Dawn Clark; [id number]; [birth year]   Admit date: 02/18/2022 Date of Consult: 02/01/2022  Primary Care Provider: Albina Billet, MD Primary Cardiologist: new - consult by End Primary Electrophysiologist:  None   Patient Profile:   Dawn Clark is a 88 y.o. female with a hx of vertigo and osteoporosis who is being seen today for the evaluation of anterior ST elevation MI and A-fib with RVR at the request of Dr. Charna Archer.  History of Present Illness:   Ms. [name] has no previously known cardiac history.  She reports development of bilateral breast pain on 02/23/2022 that began at rest with some associated dyspnea.  Symptoms persisted throughout the evening, though she did not seek medical attention.  She was without symptoms of palpitations, dizziness, presyncope, or syncope.  No diaphoresis, nausea, or vomiting.  Upon waking up this morning, she continued to have chest discomfort, though it was now substernal without radiation.  Dyspnea persisted.  Due to ongoing pain, she decided to go to a local urgent care.  While ambulating to her car she fell backwards, striking her head.  She denies any LOC.  Given these events, EMS was called with initial EKG showing A-fib with RVR with LBBB and anterior ST elevation.  BP stable upon presentation at 118/85.  EKG showed A-fib with RVR, 147 bpm, LBBB with anterior elevation.  Code STEMI was paged.  Upon arrival to the ED to evaluate the patient, patient did continue to note substernal chest discomfort and remained in A-fib with RVR.  Given EKG findings, decision was made to pursue emergent DCCV.  Patient was given 2.5 mg of etomidate followed by successful DCCV at 200 J x 1.  With sedation, patient did become hypoxic with O2 saturations of 71% on 2 L supplemental oxygen via nasal cannula which improved with titration of supplemental oxygen and jaw thrust.  Repeat EKG in sinus rhythm demonstrated LBBB with anterior ST  elevation consistent with Sgarbossa criteria.  Given persistent abnormalities noted on EKG, emergent LHC was recommended.  However, given fall with head trauma stat head CT/cervical spine was recommended prior with results pending at this time.  Initial high sensitive troponin 11,143.   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   CATARACT EXTRACTION W/PHACO Left 07/15/2016   Procedure: CATARACT EXTRACTION PHACO AND INTRAOCULAR LENS PLACEMENT ([facility]);  Surgeon: Leandrew Koyanagi, MD;  Location: [place];  Service: Ophthalmology;  Laterality: Left;  left   KNEE ARTHROPLASTY Left 06/07/2017   Procedure: COMPUTER ASSISTED TOTAL KNEE ARTHROPLASTY;  Surgeon: Dereck Leep, MD;  Location: [facility];  Service: Orthopedics;  Laterality: Left;   KNEE ARTHROPLASTY Right 08/28/2020   Procedure: COMPUTER ASSISTED TOTAL KNEE ARTHROPLASTY;  Surgeon: Dereck Leep, MD;  Location: [facility];  Service: Orthopedics;  Laterality: Right;   WISDOM TOOTH EXTRACTION       Home Meds: Prior to Admission medications   Medication Sig Start Date End Date Taking? Authorizing  Provider  acetaminophen (TYLENOL) 650 MG CR tablet Take 650 mg by mouth every 8 (eight) hours as needed for pain.    [provider]  alendronate (FOSAMAX) 70 MG tablet Take 70 mg by mouth every Monday. In the morning.Take with a full glass of water on an empty stomach.    [provider]  amoxicillin (AMOXIL) 500 MG capsule Take 2,000 mg by mouth  once. 1 hour prior to dental appts    [provider]  Calcium Carb-Cholecalciferol (CALCIUM 600+D3 PO) Take 1 tablet by mouth daily.    [provider]  celecoxib (CELEBREX) 200 MG capsule Take 1 capsule (200 mg total) by mouth 2 (two) times daily. 08/30/20   Tamala Julian B, PA-C  enoxaparin (LOVENOX) 40 MG/0.4ML injection Inject 0.4 mLs (40 mg total) into the skin daily for 14 days. 08/30/20 09/13/20  Fausto Skillern, PA-C  Multiple Vitamins-Minerals (CENTRUM SILVER) tablet Take 1 tablet by mouth daily.    [provider]  NONFORMULARY OR COMPOUNDED ITEM Apply 1 Pump topically 4 (four) times daily as needed (toe joints). Baclofen 2% Diclofenac 5% Gabapentin 6% Tetracaine 3%    [provider]  traMADol (ULTRAM) 50 MG tablet Take 1 tablet (50 mg total) by mouth every 4 (four) hours as needed for moderate pain. 08/30/20   Fausto Skillern, PA-C    Inpatient Medications: Scheduled Meds:  Continuous Infusions:  PRN Meds: etomidate  Allergies:  No Known Allergies  Social History:   Social History   Socioeconomic History   Marital status: Widowed    Spouse name: Not on file   Number of children: 2   Years of education: 12   Highest education level: High school graduate  Occupational History   Not on file  Tobacco Use   Smoking status: Never   Smokeless tobacco: Never  Vaping Use   Vaping Use: Never used  Substance and Sexual Activity   Alcohol use: No   Drug use: No   Sexual activity: Not on file  Other Topics Concern   Not on file  Social History Narrative   Not on file   Social Determinants of Health   Financial Resource Strain: Not on file  Food Insecurity: Not on file  Transportation Needs: Not on file  Physical Activity: Not on file  Stress: Not on file  Social Connections: Not on file  Intimate Partner Violence: Not on file     Family History:   Family History  Problem Relation Age of Onset   Heart disease Mother     Diabetes type II Father     ROS:  Review of Systems  Constitutional:  Positive for malaise/fatigue. Negative for chills, diaphoresis, fever and weight loss.  HENT:  Negative for congestion.   Eyes:  Negative for discharge and redness.  Respiratory:  Negative for cough, sputum production, shortness of breath and wheezing.   Cardiovascular:  Positive for chest pain. Negative for palpitations, orthopnea, claudication, leg swelling and PND.  Gastrointestinal:  Negative for abdominal pain, heartburn, nausea and vomiting.  Musculoskeletal:  Positive for falls. Negative for myalgias.  Skin:  Negative for rash.  Neurological:  Negative for dizziness, tingling, tremors, sensory change, speech change, focal weakness, loss of consciousness, weakness and headaches.  Endo/Heme/Allergies:  Does not bruise/bleed easily.  Psychiatric/Behavioral:  Negative for substance abuse. The patient is not nervous/anxious.   All other systems reviewed and are negative.     Physical Exam/Data:   Vitals:   02/23/2022 0945  02/23/2022 0946 02/28/2022 0948 02/13/2022 0949  BP:      Pulse: 88 86 90 91  Resp: (!) 22 (!) 28 (!) 35 14  Temp:      TempSrc:      SpO2: 93% (!) 83% 97% 96%  Weight:       No intake or output data in the 24 hours ending 02/04/2022 0955 Filed Weights   03/02/2022 0911  Weight: 63.4 kg   Body mass index is 25.55 kg/m.   Physical Exam: General: Well developed, well nourished, in no acute distress. Head: Normocephalic, atraumatic, sclera non-icteric, no xanthomas, nares without discharge.  Neck: Negative for carotid bruits. JVD not elevated. Lungs: Clear bilaterally to auscultation without wheezes, rales, or rhonchi. Breathing is unlabored. Heart: RRR with S1 S2. No murmurs, rubs, or gallops appreciated. Abdomen: Soft, non-tender, non-distended with normoactive bowel sounds. No hepatomegaly. No rebound/guarding. No obvious abdominal masses. Msk:  Strength and tone appear normal for  age. Extremities: No clubbing or cyanosis. No edema. Distal pedal pulses are 2+ and equal bilaterally. Neuro: Alert and oriented X 3. No facial asymmetry. No focal deficit. Moves all extremities spontaneously. Psych:  Responds to questions appropriately with a normal affect.   EKG:  The EKG was personally reviewed and demonstrates: Afib with RVR, 147 bpm, anterior elevation and LBBB.  Repeat EKG showed NSR, 90 bpm, LBBB with anterior elevation consistent with Sgarbossa criteria Telemetry:  Telemetry was personally reviewed and demonstrates: Afib with RVR with BBB converting to sinus rhythm  Weights: Filed Weights   02/05/2022 0911  Weight: 63.4 kg    Relevant CV Studies:  None available for review  Laboratory Data:  ChemistryNo results for input(s): "NA", "K", "CL", "CO2", "GLUCOSE", "BUN", "CREATININE", "CALCIUM", "GFRNONAA", "GFRAA", "ANIONGAP" in the last 168 hours.  No results for input(s): "PROT", "ALBUMIN", "AST", "ALT", "ALKPHOS", "BILITOT" in the last 168 hours. Hematology Recent Labs  Lab 02/08/2022 0917  WBC 14.1*  RBC 5.13*  HGB 16.4*  HCT 49.6*  MCV 96.7  MCH 32.0  MCHC 33.1  RDW 12.4  PLT 238   Cardiac EnzymesNo results for input(s): "TROPONINI" in the last 168 hours. No results for input(s): "TROPIPOC" in the last 168 hours.  BNPNo results for input(s): "BNP", "PROBNP" in the last 168 hours.  DDimer No results for input(s): "DDIMER" in the last 168 hours.  Radiology/Studies:  No results found.  Assessment and Plan:   1.  Anterior ST elevation MI: -Pursue emergent LHC given persistent EKG abnormalities in sinus rhythm pending head CT findings given mechanical fall with head trauma -Door to balloon time was delayed given need to pursue emergent DCCV and stat head CT -Heparin deferred in the ED pending stat head CT -Initial high-sensitivity troponin 11,143, cycle to peak -Obtain echo post cath -Check lipid panel and A1c for further risk  stratification -Further recommendations pending cardiac cath  2.  New onset A-fib with RVR: -Onset uncertain, however, due to unstable symptoms she underwent emergent DCCV in the ED with successful conversion to sinus rhythm -EKG abnormalities persisted -Stat head CT pending, if reassuring, will need to pursue [place] prior to discharge -Obtain TSH, magnesium, and electrolytes   Shared Decision Making/Informed Consent{  The risks [stroke (1 in 1000), death (1 in 1000), kidney failure [usually temporary] (1 in 500), bleeding (1 in 200), allergic reaction [possibly serious] (1 in 200)], benefits (diagnostic support and management of coronary artery disease) and alternatives of a cardiac catheterization were discussed in detail with Ms. Snapp and  she is willing to proceed.   The risks (stroke, cardiac arrhythmias rarely resulting in the need for a temporary or permanent pacemaker, skin irritation or burns and complications associated with conscious sedation including aspiration, arrhythmia, respiratory failure and death), benefits (restoration of normal sinus rhythm) and alternatives of a direct current cardioversion were explained in detail to Ms. Weakland and she agrees to proceed.      For questions or updates, please contact Progress Please consult [email or web address] for contact info under Cardiology/STEMI.   Signed, Christell Faith, PA-C [place] Pager: [phone] 02/13/2022, 9:55 AM

## 2022-02-24 NOTE — Sedation Documentation (Signed)
Pt desatting at this time, jaw thrust performed by Dr Charna Archer with success 92% on 4L

## 2022-02-24 NOTE — Sedation Documentation (Addendum)
Dr Charna Archer and cardiology to bedside for sedation and carioverson  Pt connected to pads and 2L [place], ambu bag at bedside

## 2022-02-24 NOTE — Assessment & Plan Note (Signed)
Patient presented to the ER for evaluation of chest pain and was found to have new onset rapid atrial fibrillation. She status post cardioversion and is currently in sinus rhythm. Appreciate cardiology input Patient will be started on a heparin infusion about 8 hours after radial/femoral hemostasis has been achieved and will be discharged on either warfarin or DOAC.

## 2022-02-24 NOTE — ED Notes (Signed)
Dr. End at bedside. 

## 2022-02-24 NOTE — Progress Notes (Signed)
ANTICOAGULATION CONSULT NOTE  Pharmacy Consult for heparin infusion Indication: atrial fibrillation  No Known Allergies  Patient Measurements: Weight: 63.4 kg (139 lb 11.2 oz) Heparin Dosing Weight: 62.9 kg   Vital Signs: Temp: 97.4 F (36.3 C) (10/24 1146) Temp Source: Oral (10/24 1146) BP: 102/70 (10/24 1146) Pulse Rate: 82 (10/24 1146)  Labs: Recent Labs    02-26-2022 0917 02-26-2022 0918  HGB 16.4*  --   HCT 49.6*  --   PLT 238  --   CREATININE 1.02*  --   TROPONINIHS  --  11,143*    CrCl cannot be calculated (Unknown ideal weight.).   Medical History: Past Medical History:  Diagnosis Date   Arthritis    Chicken pox    Degenerative arthritis of right knee 07/2020   Osteoporosis    Vertigo    x1 over 2 yrs    Assessment: 88 y.o. female with medical history significant for osteoporosis, vertigo and arthritis who was brought into the ER by EMS for evaluation of chest pain and a fall. Pharmacy is asked to start and monitor IV heparin. Patient is on no chronic anticoagulation prior to arrival  Goal of Therapy:  Heparin level 0.3-0.7 units/ml Monitor platelets by anticoagulation protocol: Yes   Plan:  start IV heparin 8 hours after right femoral artery hemostasis (02-26-22 1024) was achieved Start heparin infusion at 1000 units/hr beginning at 1824 Check anti-Xa level in 8 hours after heparin initiation Continue to monitor H&H and platelets  Dawn Clark 02-26-2022,1:15 PM

## 2022-02-24 NOTE — Assessment & Plan Note (Signed)
Patient presented for evaluation of chest pain and was noted to have EKG changes concerning for an anterior lateral STEMI. She was taken emergently to the Cath Lab and found to have an occluded ostial/proximal LAD.  She is status post PCI with stent angioplasty. Continue aspirin, Brilinta, metoprolol and high intensity statins

## 2022-02-24 NOTE — ED Provider Notes (Signed)
[facility] Provider Note    Event Date/Time   First MD Initiated Contact with Patient 26-Feb-2022 [id number]     (approximate)   History   Chief Complaint Chest Pain   HPI  Dawn Clark is a 88 y.o. female with past medical history of osteoarthritis and vertigo who presents to the ED complaining of chest pain.  Patient reports that she was woken from sleep with dull aching pain in both of her breasts around 1:00 this morning.  She was able to go back to sleep but when she woke up she had increased pain that seem to move into the area of her sternum.  Pain worsened as she got up out of bed and attempted to take herself to urgent care.  She ended up falling on the steps outside of her home on the way outside, striking the back of her head on the concrete.  Her neighbor apparently told patient's granddaughter that she briefly lost consciousness, but patient denies take any blood thinners.  Patient denies any pain related to the fall, but continues to have pain in her chest.     Physical Exam   Triage Vital Signs: ED Triage Vitals  Enc Vitals Group     BP 2022/02/26 0910 118/85     Pulse Rate 26-Feb-2022 0910 (!) 120     Resp February 26, 2022 0910 20     Temp --      Temp src --      SpO2 --      Weight 2022-02-26 0911 139 lb 11.2 oz (63.4 kg)     Height --      Head Circumference --      Peak Flow --      Pain Score 02/26/2022 0911 7     Pain Loc --      Pain Edu? --      Excl. in GC? --     Most recent vital signs: Vitals:   26-Feb-2022 0948 26-Feb-2022 0949  BP:    Pulse: 90 91  Resp: (!) 35 14  Temp:    SpO2: 97% 96%    Constitutional: Alert and oriented. Eyes: Conjunctivae are normal. Head: Atraumatic. Nose: No congestion/rhinnorhea. Mouth/Throat: Mucous membranes are moist.  Neck: No midline cervical spine tenderness to palpation. Cardiovascular: Tachycardic, irregularly irregular rhythm. Grossly normal heart sounds.  2+ radial pulses bilaterally. Respiratory:  Normal respiratory effort.  No retractions. Lungs CTAB. Gastrointestinal: Soft and nontender. No distention. Musculoskeletal: No lower extremity tenderness nor edema.  Neurologic:  Normal speech and language. No gross focal neurologic deficits are appreciated.    ED Results / Procedures / Treatments   Labs (all labs ordered are listed, but only abnormal results are displayed) Labs Reviewed  COMPREHENSIVE METABOLIC PANEL - Abnormal; Notable for the following components:      Result Value   Glucose, Bld 162 (*)    Creatinine, Ser 1.02 (*)    Calcium 8.6 (*)    AST 151 (*)    GFR, Estimated 52 (*)    All other components within normal limits  CBC WITH DIFFERENTIAL/PLATELET - Abnormal; Notable for the following components:   WBC 14.1 (*)    RBC 5.13 (*)    Hemoglobin 16.4 (*)    HCT 49.6 (*)    Neutro Abs 10.9 (*)    All other components within normal limits  TSH - Abnormal; Notable for the following components:   TSH 5.113 (*)    All other components  within normal limits  CBG MONITORING, ED - Abnormal; Notable for the following components:   Glucose-Capillary 142 (*)    All other components within normal limits  TROPONIN I (HIGH SENSITIVITY) - Abnormal; Notable for the following components:   Troponin I (High Sensitivity) 11,143 (*)    All other components within normal limits  MAGNESIUM     EKG  ED ECG REPORT I, Blake Divine, the attending physician, personally viewed and interpreted this ECG.   Date: 02/08/2022  EKG Time: 9:07  Rate: 147  Rhythm: atrial fibrillation  Axis: LAD  Intervals:nonspecific intraventricular conduction delay  ST&T Change: ST elevation in I and aVL with depressions in V5 and V6  ED ECG REPORT I, Blake Divine, the attending physician, personally viewed and interpreted this ECG.   Date: 02/18/2022  EKG Time: 9:46  Rate: 90  Rhythm: normal sinus rhythm  Axis: LAD  Intervals:left bundle branch block  ST&T Change: ST elevation in I,  aVL, and V2   RADIOLOGY CT head reviewed and interpreted by me with no hemorrhage or midline shift.  PROCEDURES:  Critical Care performed: Yes, see critical care procedure note(s)  .Critical Care  Performed by: Blake Divine, MD Authorized by: Blake Divine, MD   Critical care provider statement:    Critical care time (minutes):  60   Critical care time was exclusive of:  Separately billable procedures and treating other patients and teaching time   Critical care was necessary to treat or prevent imminent or life-threatening deterioration of the following conditions:  Cardiac failure   Critical care was time spent personally by me on the following activities:  Development of treatment plan with patient or surrogate, discussions with consultants, evaluation of patient's response to treatment, examination of patient, ordering and review of laboratory studies, ordering and review of radiographic studies, ordering and performing treatments and interventions, pulse oximetry, re-evaluation of patient's condition and review of old charts   I assumed direction of critical care for this patient from another provider in my specialty: no     Care discussed with: admitting provider   .Cardioversion  Date/Time: 02/01/2022 10:01 AM  Performed by: Blake Divine, MD Authorized by: Blake Divine, MD   Consent:    Consent obtained:  Verbal   Consent given by:  Patient and healthcare agent   Risks discussed:  Cutaneous burn, death, induced arrhythmia and pain   Alternatives discussed:  Rate-control medication Pre-procedure details:    Cardioversion basis:  Emergent   Rhythm:  Atrial fibrillation   Electrode placement:  Anterior-posterior Patient sedated: Yes. Refer to sedation procedure documentation for details of sedation.  Attempt one:    Cardioversion mode:  Synchronous   Waveform:  Monophasic   Shock (Joules):  200   Shock outcome:  Conversion to normal sinus  rhythm Post-procedure details:    Patient status:  Awake   Patient tolerance of procedure:  Tolerated well, no immediate complications .Sedation  Date/Time: 02/15/2022 10:02 AM  Performed by: Blake Divine, MD Authorized by: Blake Divine, MD   Consent:    Consent obtained:  Verbal   Consent given by:  Patient and healthcare agent   Risks discussed:  Prolonged hypoxia resulting in organ damage, allergic reaction, dysrhythmia, prolonged sedation necessitating reversal, respiratory compromise necessitating ventilatory assistance and intubation, inadequate sedation, nausea and vomiting   Alternatives discussed:  Anxiolysis and analgesia without sedation Universal protocol:    Immediately prior to procedure, a time out was called: yes     Patient identity confirmed:  Arm band and verbally with patient Indications:    Procedure performed:  Cardioversion   Procedure necessitating sedation performed by:  Physician performing sedation Pre-sedation assessment:    Time since last food or drink:  12   ASA classification: class 2 - patient with mild systemic disease     Mouth opening:  2 finger widths   Thyromental distance:  3 finger widths   Mallampati score:  II - soft palate, uvula, fauces visible   Neck mobility: normal     Pre-sedation assessments completed and reviewed: airway patency, cardiovascular function, hydration status, mental status, nausea/vomiting, pain level, respiratory function and temperature     Pre-sedation assessment completed:  Mar 26, 2022 9:40 AM Immediate pre-procedure details:    Reassessment: Patient reassessed immediately prior to procedure     Reviewed: vital signs, relevant labs/tests and NPO status     Verified: bag valve mask available, emergency equipment available, intubation equipment available, IV patency confirmed, oxygen available, reversal medications available and suction available   Procedure details (see MAR for exact dosages):    Preoxygenation:   Nasal cannula   Sedation:  Etomidate   Intended level of sedation: deep   Analgesia:  None   Intra-procedure monitoring:  Cardiac monitor, blood pressure monitoring, continuous capnometry, continuous pulse oximetry, frequent LOC assessments and frequent vital sign checks   Intra-procedure events: hypoxia and respiratory depression     Intra-procedure management:  Airway repositioning   Total Provider sedation time (minutes):  10 Post-procedure details:    Post-sedation assessment completed:  03-26-22 10:03 AM   Attendance: Constant attendance by certified staff until patient recovered     Recovery: Patient returned to pre-procedure baseline     Post-sedation assessments completed and reviewed: airway patency, cardiovascular function, hydration status, mental status, nausea/vomiting, pain level, respiratory function and temperature     Patient is stable for discharge or admission: yes     Procedure completion:  Tolerated well, no immediate complications    MEDICATIONS ORDERED IN ED: Medications  etomidate (AMIDATE) injection ( Intravenous Canceled Entry 2022-03-26 0945)  lidocaine (PF) (XYLOCAINE) 1 % injection (7 mLs Infiltration Given 26-Mar-2022 1023)  verapamil (ISOPTIN) injection (2.5 mg Intra-arterial Given 2022/03/26 1016)  Heparin (Porcine) in NaCl 1000-0.9 UT/500ML-% SOLN (1,000 mLs  Given 03/26/2022 1016)  heparin sodium (porcine) injection (3,000 Units Intravenous Given 03-26-2022 1020)  furosemide (LASIX) injection (40 mg Intravenous Given 2022-03-26 1029)  cangrelor (KENGREAL) bolus via infusion (1,902 mcg Intravenous Given March 26, 2022 1037)  cangrelor (KENGREAL) 50,000 mcg in sodium chloride 0.9 % 250 mL (200 mcg/mL) infusion (4 mcg/kg/min  63.4 kg Intravenous New Bag/Given Mar 26, 2022 1038)  nitroGLYCERIN 1 mg/10 mL (100 mcg/mL) - IR/CATH LAB (200 mcg Intracoronary Given Mar 26, 2022 1038)  metoprolol tartrate (LOPRESSOR) injection 5 mg (5 mg Intravenous Given 2022/03/26 0928)      IMPRESSION / MDM / ASSESSMENT AND PLAN / ED COURSE  I reviewed the triage vital signs and the nursing notes.                              88 y.o. female with past medical history of osteoarthritis and vertigo who presents to the ED complaining of onset of chest pain around 1:00 this morning that worsened when she got up and attempted to go to urgent care, followed by a fall where she struck her head and briefly lost consciousness.  Patient's presentation is most consistent with acute presentation with potential threat to life or bodily  function.  Differential diagnosis includes, but is not limited to, ACS, PE, pneumonia, dissection, arrhythmia, intracranial injury, cervical spine injury.  Patient uncomfortable appearing on arrival and noted to be markedly tachycardic with irregular rhythm.  Reviewing her chart, she has no history of atrial fibrillation or left bundle branch block, although EKG appears most consistent with atrial fibrillation with aberrancy.  She was also noted to have ST elevation that would be greater than expected with a left bundle branch block.  EKG was reviewed with Dr. Okey Dupre of cardiology, who agrees that presentation is concerning for STEMI and code STEMI was activated.  Following evaluation by Dr. Okey Dupre at bedside, decision was made to cardiovert in the case that her elevated heart rate was contributing to STEMI.  Patient was cardioverted with 200 J into sinus rhythm, however EKG remains concerning for STEMI.  Patient was taken initially to CT scanner due to her fall with LOC, CT head and cervical spine are negative for acute process and patient was subsequently taken directly to Cath Lab for further management.  Labs remarkable for mild AKI without significant electrolyte abnormality, patient also with markedly elevated troponin at greater than 11,000.  Patient with mild leukocytosis, no significant anemia.      FINAL CLINICAL IMPRESSION(S) / ED DIAGNOSES   Final  diagnoses:  Atrial fibrillation with RVR (HCC)  ST elevation myocardial infarction (STEMI), unspecified artery (HCC)     Rx / DC Orders   ED Discharge Orders     None        Note:  This document was prepared using Dragon voice recognition software and may include unintentional dictation errors.   Chesley Noon, MD 02/26/2022 1041

## 2022-02-24 NOTE — ED Triage Notes (Signed)
Pt to ED via [facility] from home for chest pain and fall. Pts chest pain started last night. When pt woke up this morning she still had chest pain. When walking to the car to go to urgent care, pt fell. Pt states that she lost her balance and fell backwards, hitting her head. Pt denies LOC. Pt is not on blood thinners. Pt c/o pain in her head and left elbow. Pt is currently in NAD.

## 2022-02-24 NOTE — Assessment & Plan Note (Signed)
Patient presented for evaluation of chest and found to have a STEMI complicated by acute CHF thought to be secondary to reduced LVEF. Follow-up results of 2D echocardiogram Continue IV Lasix 40 mg twice daily Continue metoprolol and patient started on an ACE or ARB if blood pressure tolerates

## 2022-02-24 NOTE — H&P (Signed)
History and Physical    Patient: Dawn Clark YJE:[id number] DOB: [birth year] DOA: 02/23/2022 DOS: the patient was seen and examined on 02/12/2022 PCP: Jaclyn Shaggy, MD  Patient coming from: Home  Chief Complaint:  Chief Complaint  Patient presents with   Chest Pain   HPI: Dawn Clark is a 88 y.o. female with medical history significant for osteoporosis, vertigo and arthritis who was brought into the ER by EMS for evaluation of chest pain and a fall. Patient states that she has had chest pain for about 2 days mostly in both breasts and denies having any associated symptoms except for shortness of breath.  Pain was persistent and nonradiating.  She denied having any nausea, no vomiting, no palpitations or diaphoresis.  She had called her neighbor this morning to take her to the urgent care for evaluation and while walking to the car patient lost her balance and fell backwards hitting her head.  Her friend noted that she lost consciousness for a few minutes but patient does not remember.  EMS was called and she was brought to the ER. Upon arrival to the ER she was found to be in A-fib with rapid ventricular rate and required emergent cardioversion.  Twelve-lead EKG showed ST elevations in the anterior lateral leads and patient was taken emergently to the Cath Lab. She denies having any fever, no chills, no cough, no dizziness, no lightheadedness, no headache, no abdominal pain, no blurred vision or any focal deficits.    Review of Systems: As mentioned in the history of present illness. All other systems reviewed and are negative. 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   CATARACT EXTRACTION W/PHACO Left 07/15/2016   Procedure: CATARACT EXTRACTION PHACO AND INTRAOCULAR LENS PLACEMENT (IOC);  Surgeon: Lockie Mola, MD;  Location: [facility];  Service: Ophthalmology;  Laterality: Left;  left   KNEE ARTHROPLASTY Left 06/07/2017   Procedure: COMPUTER ASSISTED TOTAL KNEE ARTHROPLASTY;  Surgeon: Donato Heinz, MD;  Location: [facility];  Service: Orthopedics;  Laterality: Left;   KNEE ARTHROPLASTY Right 08/28/2020   Procedure: COMPUTER ASSISTED TOTAL KNEE ARTHROPLASTY;  Surgeon: Donato Heinz, MD;  Location: [facility];  Service: Orthopedics;  Laterality: Right;   WISDOM TOOTH EXTRACTION     Social History:  reports that she has never smoked. She has never used smokeless tobacco. She reports that she does not drink alcohol and does not use drugs.  No Known Allergies  Family History  Problem Relation Age of Onset   Heart disease Mother    Diabetes type II Father     Prior to Admission medications   Medication Sig Start Date End Date Taking? Authorizing Provider  acetaminophen (TYLENOL) 650 MG CR tablet Take 650 mg by mouth every 8 (eight) hours as needed for pain.   Yes [provider]  alendronate (FOSAMAX) 70 MG tablet Take 70 mg by mouth every Monday. In the morning.Take with a full glass of water on an empty stomach.  Yes [provider]  amoxicillin (AMOXIL) 500 MG capsule Take 2,000 mg by mouth once. 1 hour prior to dental appts   Yes [provider]  Calcium Carb-Cholecalciferol (CALCIUM 600+D3 PO) Take 1 tablet by mouth daily.   Yes [provider]  Multiple Vitamins-Minerals (CENTRUM SILVER) tablet Take 1 tablet by mouth daily.   Yes [provider]    Physical Exam: Vitals:   2022-03-11 0946 2022/03/11 0948 03-11-22 0949 March 11, 2022  1146  BP:    102/70  Pulse: 86 90 91 82  Resp: (!) 28 (!) 35 14 (!) 29  Temp:    (!) 97.4 F (36.3 C)  TempSrc:    Oral  SpO2: (!) 83% 97% 96% 96%  Weight:       Physical Exam Vitals and nursing note reviewed.  HENT:     Head: Normocephalic and atraumatic.  Eyes:     Pupils: Pupils are equal, round, and reactive to light.  Cardiovascular:     Rate and Rhythm: Normal rate and regular rhythm.     Heart sounds: Normal heart sounds.  Pulmonary:     Effort: Pulmonary effort is normal.     Breath sounds: Examination of the right-lower field reveals rales. Examination of the left-lower field reveals rales. Rales present.  Abdominal:     General: Bowel sounds are normal.     Palpations: Abdomen is soft.  Musculoskeletal:        General: Normal range of motion.     Cervical back: Normal range of motion and neck supple.  Skin:    General: Skin is warm and dry.  Neurological:     General: No focal deficit present.     Mental Status: She is alert.  Psychiatric:        Mood and Affect: Mood normal.        Behavior: Behavior normal.     Data Reviewed: Data Reviewed: Relevant notes from primary care and specialist visits, past discharge summaries as available in EHR, including Care Everywhere. Prior diagnostic testing as pertinent to current admission diagnoses Updated medications and problem lists for reconciliation ED course, including vitals, labs, imaging, treatment and response to treatment Triage notes, nursing and pharmacy notes and ED provider's notes Notable results as noted in HPI Labs reviewed.  Sodium 141, potassium 3.7, chloride 108, bicarb 24, glucose 162, BUN 20, creatinine 1.02, calcium 8.6, total protein 6.7, albumin 3.7, AST 151, ALT 35, alkaline phosphatase 45, total bilirubin 0.7, magnesium 2.0, TSH 5.11, troponin 11143, white count 14.1, hemoglobin 16.4, hematocrit 49.6, platelet count 238 CT scan of the head and cervical spine showed No acute intracranial  abnormality. Stable mild sequela of chronic small vessel ischemic disease. No acute cervical spine fracture. Multilevel degenerative disc disease and facet arthropathy. Chest x-ray reviewed by me shows cardiomegaly with findings suggestive of pulmonary edema and perihilar atelectasis. Twelve-lead EKG reviewed by me shows wide-complex tachycardia, right bundle branch block There are no new results to review at this time.  Assessment and Plan: * STEMI involving left anterior descending coronary artery [facility]) Patient presented for evaluation of chest pain and was noted to have EKG changes concerning for an anterior lateral STEMI. She was taken emergently to the Cath Lab and found to have an occluded ostial/proximal LAD.  She is status post PCI with stent angioplasty. Continue aspirin, Brilinta, metoprolol and high intensity statins  Atrial fibrillation, new onset [facility]) Patient presented to the ER for evaluation of chest pain and was found to  have new onset rapid atrial fibrillation. She status post cardioversion and is currently in sinus rhythm. Appreciate cardiology input Patient will be started on a heparin infusion about 8 hours after radial/femoral hemostasis has been achieved and will be discharged on either warfarin or DOAC.  Acute systolic CHF (congestive heart failure) (HCC) Patient presented for evaluation of chest and found to have a STEMI complicated by acute CHF thought to be secondary to reduced LVEF. Follow-up results of 2D echocardiogram Continue IV Lasix 40 mg twice daily Continue metoprolol and patient started on an ACE or ARB if blood pressure tolerates      Advance Care Planning:   Code Status: Full Code   Consults: Cardiology  Family Communication: Greater than 50% of time was spent discussing plan of care with patient and her family at the bedside.  All questions and concerns have been addressed.  She verbalizes understanding and agrees with the plan.  Severity of  Illness: The appropriate patient status for this patient is INPATIENT. Inpatient status is judged to be reasonable and necessary in order to provide the required intensity of service to ensure the patient's safety. The patient's presenting symptoms, physical exam findings, and initial radiographic and laboratory data in the context of their chronic comorbidities is felt to place them at high risk for further clinical deterioration. Furthermore, it is not anticipated that the patient will be medically stable for discharge from the hospital within 2 midnights of admission.   * I certify that at the point of admission it is my clinical judgment that the patient will require inpatient hospital care spanning beyond 2 midnights from the point of admission due to high intensity of service, high risk for further deterioration and high frequency of surveillance required.*  Author: Lucile Shutters, MD 02/17/2022 2:01 PM  For on call review [email or web address].

## 2022-02-25 ENCOUNTER — Encounter: Payer: Self-pay | Admitting: Internal Medicine

## 2022-02-25 ENCOUNTER — Inpatient Hospital Stay (HOSPITAL_COMMUNITY)
Admit: 2022-02-25 | Discharge: 2022-02-25 | Disposition: A | Discharge status: Home / Self Care | Payer: Medicare HMO | Attending: Internal Medicine | Admitting: Internal Medicine

## 2022-02-25 DIAGNOSIS — I2102 ST elevation (STEMI) myocardial infarction involving left anterior descending coronary artery: Secondary | ICD-10-CM | POA: Diagnosis not present

## 2022-02-25 DIAGNOSIS — I4891 Unspecified atrial fibrillation: Secondary | ICD-10-CM | POA: Diagnosis not present

## 2022-02-25 DIAGNOSIS — I213 ST elevation (STEMI) myocardial infarction of unspecified site: Secondary | ICD-10-CM

## 2022-02-25 DIAGNOSIS — I5021 Acute systolic (congestive) heart failure: Secondary | ICD-10-CM

## 2022-02-25 LAB — ECHOCARDIOGRAM COMPLETE
AR max vel: 2.58 cm2
AV Area VTI: 2.37 cm2
AV Area mean vel: 2.33 cm2
AV Mean grad: 2 mmHg
AV Peak grad: 3 mmHg
Ao pk vel: 0.87 m/s
Area-P 1/2: 6.02 cm2
Calc EF: 30.2 %
S' Lateral: 3.8 cm
Single Plane A2C EF: 17.5 %
Single Plane A4C EF: 35.2 %
Weight: 2235.2 oz

## 2022-02-25 LAB — ECHOCARDIOGRAM LIMITED
Calc EF: 31.3 %
Height: 62 in
S' Lateral: 3.8 cm
Single Plane A2C EF: 47.4 %
Single Plane A4C EF: 19.5 %
Weight: 2236.35 oz

## 2022-02-25 LAB — BASIC METABOLIC PANEL
Anion gap: 12 (ref 5–15)
BUN: 24 mg/dL — ABNORMAL HIGH (ref 8–23)
CO2: 26 mmol/L (ref 22–32)
Calcium: 8.6 mg/dL — ABNORMAL LOW (ref 8.9–10.3)
Chloride: 103 mmol/L (ref 98–111)
Creatinine, Ser: 1.14 mg/dL — ABNORMAL HIGH (ref 0.44–1.00)
GFR, Estimated: 45 mL/min — ABNORMAL LOW (ref >60)
Glucose, Bld: 125 mg/dL — ABNORMAL HIGH (ref 70–99)
Potassium: 3.3 mmol/L — ABNORMAL LOW (ref 3.5–5.1)
Sodium: 141 mmol/L (ref 135–145)

## 2022-02-25 LAB — CBC
HCT: 48.4 % — ABNORMAL HIGH (ref 36.0–46.0)
Hemoglobin: 16.5 g/dL — ABNORMAL HIGH (ref 12.0–15.0)
MCH: 31 pg (ref 26.0–34.0)
MCHC: 34.1 g/dL (ref 30.0–36.0)
MCV: 91 fL (ref 80.0–100.0)
Platelets: 207 10*3/uL (ref 150–400)
RBC: 5.32 MIL/uL — ABNORMAL HIGH (ref 3.87–5.11)
RDW: 12.5 % (ref 11.5–15.5)
WBC: 23 10*3/uL — ABNORMAL HIGH (ref 4.0–10.5)
nRBC: 0 % (ref 0.0–0.2)

## 2022-02-25 LAB — HEPARIN LEVEL (UNFRACTIONATED)
Heparin Unfractionated: 0.93 IU/mL — ABNORMAL HIGH (ref 0.30–0.70)
Heparin Unfractionated: 0.78 IU/mL — ABNORMAL HIGH (ref 0.30–0.70)

## 2022-02-25 LAB — MAGNESIUM: Magnesium: 2 mg/dL (ref 1.7–2.4)

## 2022-02-25 LAB — POTASSIUM: Potassium: 3.7 mmol/L (ref 3.5–5.1)

## 2022-02-25 MED ORDER — FUROSEMIDE 10 MG/ML IJ SOLN: 40.0000 mg | Freq: Every day | INTRAMUSCULAR | Status: DC

## 2022-02-25 MED ORDER — AMIODARONE HCL IN DEXTROSE 360-4.14 MG/200ML-% IV SOLN
INTRAVENOUS | Status: AC
  Administered 2022-02-25: 60 mg/h via INTRAVENOUS
  Filled 2022-02-25: qty 200

## 2022-02-25 MED ORDER — METOPROLOL SUCCINATE ER 25 MG PO TB24
12.5000 mg | ORAL_TABLET | Freq: Two times a day (BID) | ORAL | Status: DC
  Administered 2022-02-25: 12.5 mg via ORAL
  Filled 2022-02-25 (×3): qty 0.5

## 2022-02-25 MED ORDER — POTASSIUM CHLORIDE 20 MEQ PO PACK
40.0000 meq | PACK | Freq: Once | ORAL | Status: DC
  Filled 2022-02-25: qty 2

## 2022-02-25 MED ORDER — FUROSEMIDE 10 MG/ML IJ SOLN
20.0000 mg | Freq: Every day | INTRAMUSCULAR | Status: DC
  Administered 2022-02-26 – 2022-02-27 (×2): 20 mg via INTRAVENOUS
  Filled 2022-02-25 (×2): qty 2

## 2022-02-25 MED ORDER — AMIODARONE IV BOLUS ONLY 150 MG/100ML
INTRAVENOUS | Status: AC
  Filled 2022-02-25: qty 100

## 2022-02-25 MED ORDER — AMIODARONE HCL IN DEXTROSE 360-4.14 MG/200ML-% IV SOLN
60.0000 mg/h | INTRAVENOUS | Status: DC
  Administered 2022-02-25 – 2022-02-26 (×2): 30 mg/h via INTRAVENOUS
  Administered 2022-02-26 – 2022-02-27 (×4): 60 mg/h via INTRAVENOUS
  Filled 2022-02-25 (×5): qty 200

## 2022-02-25 MED ORDER — PERFLUTREN LIPID MICROSPHERE
1.0000 mL | INTRAVENOUS | Status: AC | PRN
  Administered 2022-02-25: 2 mL via INTRAVENOUS

## 2022-02-25 MED ORDER — AMIODARONE IV BOLUS ONLY 150 MG/100ML
150.0000 mg | Freq: Once | INTRAVENOUS | Status: AC
  Administered 2022-02-25: 150 mg via INTRAVENOUS
  Filled 2022-02-25: qty 100

## 2022-02-25 MED ORDER — POTASSIUM CHLORIDE 20 MEQ PO PACK
40.0000 meq | PACK | Freq: Once | ORAL | Status: AC
  Administered 2022-02-25: 40 meq via ORAL
  Filled 2022-02-25: qty 2

## 2022-02-25 MED ORDER — PERFLUTREN LIPID MICROSPHERE: 1.0000 mL | INTRAVENOUS | Status: DC | PRN

## 2022-02-25 MED ORDER — AMIODARONE HCL IN DEXTROSE 360-4.14 MG/200ML-% IV SOLN
60.0000 mg/h | INTRAVENOUS | Status: AC
  Filled 2022-02-25: qty 200

## 2022-02-25 MED ORDER — ORAL CARE MOUTH RINSE: 15.0000 mL | OROMUCOSAL | Status: DC | PRN

## 2022-02-25 MED ORDER — POTASSIUM CHLORIDE 10 MEQ/100ML IV SOLN
10.0000 meq | INTRAVENOUS | Status: DC
  Administered 2022-02-25: 10 meq via INTRAVENOUS
  Filled 2022-02-25 (×3): qty 100

## 2022-02-25 MED ORDER — POTASSIUM CHLORIDE CRYS ER 20 MEQ PO TBCR
40.0000 meq | EXTENDED_RELEASE_TABLET | Freq: Once | ORAL | Status: AC
  Administered 2022-02-25: 40 meq via ORAL

## 2022-02-25 NOTE — Progress Notes (Signed)
*  PRELIMINARY RESULTS* Echocardiogram 2D Echocardiogram has been performed.  Dawn Clark 02/25/2022, 11:20 AM

## 2022-02-25 NOTE — Progress Notes (Addendum)
Rounding Note    Patient Name: Dawn Clark Date of Encounter: 02/25/2022  [facility] Cardiologist: None   Subjective   Denies chest pain or shortness of breath, no acute events overnight, EKG showing sinus rhythm.  BP low normal.  Inpatient Medications    Scheduled Meds:  aspirin  81 mg Oral Daily   atorvastatin  80 mg Oral Daily   Chlorhexidine Gluconate Cloth  6 each Topical Daily   furosemide  40 mg Intravenous BID   metoprolol succinate  12.5 mg Oral QHS   potassium chloride  40 mEq Oral Once   sodium chloride flush  3 mL Intravenous Q12H   ticagrelor  90 mg Oral BID   Continuous Infusions:  sodium chloride     heparin 850 Units/hr (02/25/22 0600)   PRN Meds: sodium chloride, acetaminophen, ondansetron (ZOFRAN) IV, sodium chloride flush   Vital Signs    Vitals:   02/25/22 0700 02/25/22 0800 02/25/22 0900 02/25/22 1000  BP: (!) 98/59 93/63 112/66 90/67  Pulse: 89 86 92 96  Resp: 10 17 (!) 34 20  Temp:  98.2 F (36.8 C)    TempSrc:  Oral    SpO2: 92% 94% 97% 92%  Weight:      Height:        Intake/Output Summary (Last 24 hours) at 02/25/2022 1117 Last data filed at 02/25/2022 0600 Gross per 24 hour  Intake 217.37 ml  Output 1300 ml  Net -1082.63 ml      02/04/2022    3:00 PM 02/09/2022    9:11 AM 08/28/2020   10:28 AM  Last 3 Weights  Weight (lbs) 139 lb 12.4 oz 139 lb 11.2 oz 143 lb  Weight (kg) 63.4 kg 63.368 kg 64.864 kg      Telemetry    Sinus rhythm, PACs, NSVT noted on monitor- Personally Reviewed  ECG     - Personally Reviewed  Physical Exam   GEN: No acute distress.   Neck: No JVD Cardiac: RRR, no murmurs, rubs, or gallops.  Respiratory: Clear to auscultation bilaterally. GI: Soft, nontender, non-distended  MS: No edema; No deformity. Neuro:  Nonfocal  Psych: Normal affect   Labs    High Sensitivity Troponin:   Recent Labs  Lab 02/15/2022 0918 02/02/2022 1250  TROPONINIHS 11,143* >24,000*      Chemistry Recent Labs  Lab 02/16/2022 0917 02/25/22 0343  NA 141 141  K 3.7 3.3*  CL 108 103  CO2 24 26  GLUCOSE 162* 125*  BUN 20 24*  CREATININE 1.02* 1.14*  CALCIUM 8.6* 8.6*  MG 2.0  --   PROT 6.7  --   ALBUMIN 3.7  --   AST 151*  --   ALT 35  --   ALKPHOS 45  --   BILITOT 0.7  --   GFRNONAA 52* 45*  ANIONGAP 9 12    Lipids No results for input(s): "CHOL", "TRIG", "HDL", "LABVLDL", "LDLCALC", "CHOLHDL" in the last 168 hours.  Hematology Recent Labs  Lab 03/03/2022 0917 02/25/22 0343  WBC 14.1* 23.0*  RBC 5.13* 5.32*  HGB 16.4* 16.5*  HCT 49.6* 48.4*  MCV 96.7 91.0  MCH 32.0 31.0  MCHC 33.1 34.1  RDW 12.4 12.5  PLT 238 207   Thyroid  Recent Labs  Lab 02/18/2022 0917  TSH 5.113*    BNPNo results for input(s): "BNP", "PROBNP" in the last 168 hours.  DDimer No results for input(s): "DDIMER" in the last 168 hours.   Radiology  ECHOCARDIOGRAM COMPLETE  Result Date: 02/25/2022    ECHOCARDIOGRAM REPORT   Patient Name:   Dawn Clark Date of Exam: Mar 21, 2022 Medical Rec #:  [id number]      Height:       62.0 in Accession #:    [id number]     Weight:       139.7 lb Date of Birth:  [birth year]      BSA:          1.641 m Patient Age:    88 years       BP:           102/70 mmHg Patient Gender: F              HR:           82 bpm. Exam Location:  [facility] Procedure: 2D Echo, Cardiac Doppler, Color Doppler and Intracardiac            Opacification Agent Indications:     Acute myocardial Infarction -unspecified I21.9  History:         Patient has no prior history of Echocardiogram examinations.                  CHF, Coronary stent 03-21-22; Arrythmias:Atrial Fibrillation.                  No cardiac history listed in chart.  Sonographer:     Cristela Blue Sonographer#2:   Ceasar Mons Referring Phys:  [id number] CHRISTOPHER END Diagnosing Phys: Yvonne Kendall MD  Sonographer Comments: Image quality was fair. IMPRESSIONS  1. Left ventricular ejection fraction, by estimation, is 25 to  30%. The left ventricle has severely decreased function. The left ventricle demonstrates regional wall motion abnormalities (see scoring diagram/findings for description). Left ventricular diastolic parameters are consistent with Grade I diastolic dysfunction (impaired relaxation). There is severe hypokinesis of the left ventricular, entire anteroseptal wall, anterior wall, anterolateral wall and apical segment.  2. There is hypokinesis of the right ventricular apex with otherwise preserved RV contraction. The right ventricular size is normal. There is mildly elevated pulmonary artery systolic pressure.  3. The mitral valve is degenerative. Mild mitral valve regurgitation. No evidence of mitral stenosis. Moderate mitral annular calcification.  4. Tricuspid valve regurgitation is mild to moderate.  5. The aortic valve is tricuspid. Aortic valve regurgitation is mild. No aortic stenosis is present.  6. The inferior vena cava is normal in size with <50% respiratory variability, suggesting right atrial pressure of 8 mmHg. FINDINGS  Left Ventricle: There appears to be a prominent trabecular or false tendon near the LV apex; no definite thrombus is identified. Left ventricular ejection fraction, by estimation, is 25 to 30%. The left ventricle has severely decreased function. The left ventricle demonstrates regional wall motion abnormalities. Severe hypokinesis of the left ventricular, entire anteroseptal wall, anterior wall, anterolateral wall and apical segment. Definity contrast agent was given IV to delineate the left ventricular endocardial borders. The left ventricular internal cavity size was normal in size. There is no left ventricular hypertrophy. Left ventricular diastolic parameters are consistent with Grade I diastolic dysfunction (impaired relaxation). Right Ventricle: There is hypokinesis of the right ventricular apex with otherwise preserved RV contraction. The right ventricular size is normal. No increase  in right ventricular wall thickness. There is mildly elevated pulmonary artery systolic pressure. The tricuspid regurgitant velocity is 2.87 m/s, and with an assumed right atrial pressure of 8 mmHg, the estimated right ventricular systolic pressure is 40.9 mmHg.  Left Atrium: Left atrial size was normal in size. Right Atrium: Right atrial size was normal in size. Pericardium: There is no evidence of pericardial effusion. Presence of epicardial fat layer. Mitral Valve: The mitral valve is degenerative in appearance. There is mild thickening of the mitral valve leaflet(s). Moderate mitral annular calcification. Mild mitral valve regurgitation. No evidence of mitral valve stenosis. Tricuspid Valve: The tricuspid valve is not well visualized. Tricuspid valve regurgitation is mild to moderate. Aortic Valve: The aortic valve is tricuspid. Aortic valve regurgitation is mild. No aortic stenosis is present. Aortic valve mean gradient measures 2.0 mmHg. Aortic valve peak gradient measures 3.0 mmHg. Aortic valve area, by VTI measures 2.37 cm. Pulmonic Valve: The pulmonic valve was not well visualized. Pulmonic valve regurgitation is trivial. No evidence of pulmonic stenosis. Aorta: The aortic root is normal in size and structure. Venous: The inferior vena cava is normal in size with less than 50% respiratory variability, suggesting right atrial pressure of 8 mmHg. IAS/Shunts: No atrial level shunt detected by color flow Doppler.  LEFT VENTRICLE PLAX 2D LVIDd:         4.20 cm     Diastology LVIDs:         3.80 cm     LV e' medial:    5.22 cm/s LV PW:         0.90 cm     LV E/e' medial:  10.1 LV IVS:        0.80 cm     LV e' lateral:   4.57 cm/s LVOT diam:     2.00 cm     LV E/e' lateral: 11.6 LV SV:         37 LV SV Index:   23 LVOT Area:     3.14 cm  LV Volumes (MOD) LV vol d, MOD A2C: 59.5 ml LV vol d, MOD A4C: 61.9 ml LV vol s, MOD A2C: 49.1 ml LV vol s, MOD A4C: 40.1 ml LV SV MOD A2C:     10.4 ml LV SV MOD A4C:     61.9 ml  LV SV MOD BP:      18.6 ml RIGHT VENTRICLE RV Basal diam:  2.60 cm RV Mid diam:    1.70 cm RV S prime:     14.70 cm/s TAPSE (M-mode): 2.5 cm LEFT ATRIUM             Index        RIGHT ATRIUM          Index LA diam:        2.70 cm 1.65 cm/m   RA Area:     6.82 cm LA Vol (A2C):   31.8 ml 19.37 ml/m  RA Volume:   11.80 ml 7.19 ml/m LA Vol (A4C):   28.4 ml 17.30 ml/m LA Biplane Vol: 31.5 ml 19.19 ml/m  AORTIC VALVE AV Area (Vmax):    2.58 cm AV Area (Vmean):   2.33 cm AV Area (VTI):     2.37 cm AV Vmax:           86.50 cm/s AV Vmean:          63.200 cm/s AV VTI:            0.158 m AV Peak Grad:      3.0 mmHg AV Mean Grad:      2.0 mmHg LVOT Vmax:         71.10 cm/s LVOT Vmean:  46.900 cm/s LVOT VTI:          0.119 m LVOT/AV VTI ratio: 0.75  AORTA Ao Root diam: 3.10 cm MITRAL VALVE               TRICUSPID VALVE MV Area (PHT): 6.02 cm    TR Peak grad:   32.9 mmHg MV Decel Time: 126 msec    TR Vmax:        287.00 cm/s MV E velocity: 52.90 cm/s MV A velocity: 86.50 cm/s  SHUNTS MV E/A ratio:  0.61        Systemic VTI:  0.12 m                            Systemic Diam: 2.00 cm Yvonne Kendall MD Electronically signed by Yvonne Kendall MD Signature Date/Time: 02/23/2022/3:38:58 PM    Final (Updated)    DG Chest Portable 1 View  Result Date: 02/28/2022 CLINICAL DATA:  Chest pain.  Post cardiac catheterization. EXAM: PORTABLE CHEST 1 VIEW COMPARISON:  None Available. FINDINGS: Borderline enlarged cardiac silhouette and mediastinal contours. There is thickening of the right paratracheal stripe, presumably secondary to prominent vasculature. There is mild rightward deviation of the tracheal air column at the level of the aortic arch. Pulmonary vasculature is indistinct with cephalization of flow. Perihilar opacities favored to represent atelectasis. No definite pleural effusion or pneumothorax. Degenerative change the bilateral glenohumeral joints is suspected though incompletely evaluated. IMPRESSION:  Cardiomegaly with findings suggestive of pulmonary edema and perihilar atelectasis. Electronically Signed   By: Simonne Come M.D.   On: 02/13/2022 12:10   CARDIAC CATHETERIZATION  Result Date: 02/19/2022 Conclusions: Severe single-vessel coronary artery disease with thrombotic occlusion of the ostial/proximal LAD followed by sequential 30% mid and 70% mid/distal LAD stenoses.  30% ostial stenosis is also observed in OM1 branch of dominant LCx. Severely reduced LVEF with anterior hypokinesis/akinesis.  LVEF 25-30% with moderately elevated filling pressure (LVEDP 32 mmHg). Successful PCI to ostial/proximal LAD using Onyx Frontier 2.5 x 15 mm drug-eluting stent (postdilated to 2.8 mm) with 0% residual stenosis and TIMI-2 flow in the distal vessel, likely related to microvascular dysfunction in the setting of late presenting STEMI. Recommendations: Admit to ICU for STEMI/acute HFrEF management status post primary PCI. Continue cangrelor infusion for 2 hours from time of ticagrelor administration. Restart IV heparin 8 hours after right femoral artery hemostasis was achieved given atrial fibrillation with rapid ventricular response and emergent cardioversion today. Follow-up echocardiogram. Continue diuresis and initiation of goal-directed medical therapy for acute HFrEF due to ischemic cardiomyopathy as blood pressure and renal function allow. New dual antiplatelet therapy with aspirin and ticagrelor for now.  Based on results of echocardiogram (attention to LV thrombus) I would favor at least 12 months of warfarin versus DOAC plus clopidgrel at the time of discharge. Yvonne Kendall, MD [facility]  CT Head Wo Contrast  Result Date: 02/26/2022 CLINICAL DATA:  Head trauma, minor (Age >= 65y); Neck trauma (Age >= 65y) EXAM: CT HEAD WITHOUT CONTRAST CT CERVICAL SPINE WITHOUT CONTRAST TECHNIQUE: Multidetector CT imaging of the head and cervical spine was performed following the standard protocol without  intravenous contrast. Multiplanar CT image reconstructions of the cervical spine were also generated. RADIATION DOSE REDUCTION: This exam was performed according to the departmental dose-optimization program which includes automated exposure control, adjustment of the mA and/or kV according to patient size and/or use of iterative reconstruction technique. COMPARISON:  Head CT 05/08/2021. FINDINGS:  CT HEAD FINDINGS Brain: No evidence of acute intracranial hemorrhage or extra-axial collection.No evidence of mass lesion/concerning mass effect.The ventricles are normal in size.Scattered subcortical and periventricular white matter hypodensities, nonspecific but likely sequela of chronic small vessel ischemic disease.Mild cerebral atrophy Vascular: No hyperdense vessel or unexpected calcification. Skull: Normal. Negative for fracture or focal lesion. Sinuses/Orbits: No acute finding. Other: None. CT CERVICAL SPINE FINDINGS Alignment: Degenerative grade 1 anterolisthesis at C4-C5 and C6-C7 and C7-T1. Skull base and vertebrae: No acute fracture. No primary bone lesion or focal pathologic process. Soft tissues and spinal canal: No prevertebral fluid or swelling. No visible canal hematoma. Disc levels: There is mild to moderate multilevel degenerative disc disease worst at C5-C6. There is moderate-severe multilevel facet arthropathy bilaterally. C1-C2 degenerative changes. Upper chest: There is interlobular septal thickening and ground-glass opacities in the lung apices suggesting pulmonary edema. Other: None. IMPRESSION: No acute intracranial abnormality. Stable mild sequela of chronic small vessel ischemic disease. No acute cervical spine fracture. Multilevel degenerative disc disease and facet arthropathy. These results were called by telephone at the time of interpretation on 02/18/2022 at 10:13 am to provider Dr. Okey DupreEnd, who verbally acknowledged these results. Electronically Signed   By: Caprice RenshawJacob  Kahn M.D.   On: 02/10/2022  10:22   CT Cervical Spine Wo Contrast  Result Date: 02/12/2022 CLINICAL DATA:  Head trauma, minor (Age >= 65y); Neck trauma (Age >= 65y) EXAM: CT HEAD WITHOUT CONTRAST CT CERVICAL SPINE WITHOUT CONTRAST TECHNIQUE: Multidetector CT imaging of the head and cervical spine was performed following the standard protocol without intravenous contrast. Multiplanar CT image reconstructions of the cervical spine were also generated. RADIATION DOSE REDUCTION: This exam was performed according to the departmental dose-optimization program which includes automated exposure control, adjustment of the mA and/or kV according to patient size and/or use of iterative reconstruction technique. COMPARISON:  Head CT 05/08/2021. FINDINGS: CT HEAD FINDINGS Brain: No evidence of acute intracranial hemorrhage or extra-axial collection.No evidence of mass lesion/concerning mass effect.The ventricles are normal in size.Scattered subcortical and periventricular white matter hypodensities, nonspecific but likely sequela of chronic small vessel ischemic disease.Mild cerebral atrophy Vascular: No hyperdense vessel or unexpected calcification. Skull: Normal. Negative for fracture or focal lesion. Sinuses/Orbits: No acute finding. Other: None. CT CERVICAL SPINE FINDINGS Alignment: Degenerative grade 1 anterolisthesis at C4-C5 and C6-C7 and C7-T1. Skull base and vertebrae: No acute fracture. No primary bone lesion or focal pathologic process. Soft tissues and spinal canal: No prevertebral fluid or swelling. No visible canal hematoma. Disc levels: There is mild to moderate multilevel degenerative disc disease worst at C5-C6. There is moderate-severe multilevel facet arthropathy bilaterally. C1-C2 degenerative changes. Upper chest: There is interlobular septal thickening and ground-glass opacities in the lung apices suggesting pulmonary edema. Other: None. IMPRESSION: No acute intracranial abnormality. Stable mild sequela of chronic small vessel  ischemic disease. No acute cervical spine fracture. Multilevel degenerative disc disease and facet arthropathy. These results were called by telephone at the time of interpretation on 02/26/2022 at 10:13 am to provider Dr. Okey DupreEnd, who verbally acknowledged these results. Electronically Signed   By: Caprice RenshawJacob  Kahn M.D.   On: 02/26/2022 10:22    Cardiac Studies   TTE 02/09/2022  1. Left ventricular ejection fraction, by estimation, is 25 to 30%. The  left ventricle has severely decreased function. The left ventricle  demonstrates regional wall motion abnormalities (see scoring  diagram/findings for description). Left ventricular  diastolic parameters are consistent with Grade I diastolic dysfunction  (impaired relaxation). There is severe hypokinesis  of the left  ventricular, entire anteroseptal wall, anterior wall, anterolateral wall  and apical segment.   2. There is hypokinesis of the right ventricular apex with otherwise  preserved RV contraction. The right ventricular size is normal. There is  mildly elevated pulmonary artery systolic pressure.   3. The mitral valve is degenerative. Mild mitral valve regurgitation. No  evidence of mitral stenosis. Moderate mitral annular calcification.   4. Tricuspid valve regurgitation is mild to moderate.   5. The aortic valve is tricuspid. Aortic valve regurgitation is mild. No  aortic stenosis is present.   6. The inferior vena cava is normal in size with <50% respiratory  variability, suggesting right atrial pressure of 8 mmHg.  02/20/2022 Conclusions: Severe single-vessel coronary artery disease with thrombotic occlusion of the ostial/proximal LAD followed by sequential 30% mid and 70% mid/distal LAD stenoses.  30% ostial stenosis is also observed in OM1 branch of dominant LCx. Severely reduced LVEF with anterior hypokinesis/akinesis.  LVEF 25-30% with moderately elevated filling pressure (LVEDP 32 mmHg). Successful PCI to ostial/proximal LAD using  Onyx Frontier 2.5 x 15 mm drug-eluting stent (postdilated to 2.8 mm) with 0% residual stenosis and TIMI-2 flow in the distal vessel, likely related to microvascular dysfunction in the setting of late presenting STEMI.  Patient Profile     88 y.o. female presenting with chest pain, diagnosed with new onset atrial fibrillation and anterior lateral STEMI.  Assessment & Plan    Anterolateral STEMI -S/p DES to proximal LAD. -Denies chest pain. -Continue aspirin, Brilinta, heparin, Lipitor 80 -Plan Eliquis and Plavix upon discharge.  2.  HFrEF EF 25 to 30% -Continue Toprol-XL 12.5 mg daily -BP low normal -Reduce Lasix to 20 mg daily IV  3.  Paroxysmal atrial fibrillation with RVR -S/p emergent cardioversion in the ED -brief afib earlier this am- iv amio bolus -Maintaining sinus rhythm -Continue heparin, Toprol-XL 12.5mg  bid -Plan to start Eliquis upon discharge  4. Frequent ectopy on monitor nsvt, pac's, PAF -Iv amio 150 mg x 1 -increase toprol xl to 12.5 mg bid -if afib persists, plan to start amio gtt.  Total encounter time more than 50 minutes  Greater than 50% was spent in counseling and coordination of care with the patient   Signed, Kate Sable, MD  02/25/2022, 11:17 AM

## 2022-02-25 NOTE — Discharge Instructions (Signed)

## 2022-02-25 NOTE — Progress Notes (Signed)
Nutrition Brief Note  RD pulled to chart secondary to CHF.   Wt Readings from Last 15 Encounters:  02/21/2022 63.4 kg  08/28/20 64.9 kg  08/20/20 60.8 kg  06/21/17 65.8 kg  06/14/17 65.8 kg  06/07/17 64.9 kg  05/26/17 66.2 kg  10/11/16 65.8 kg  07/15/16 65.8 kg  06/17/16 63.5 kg   Pt with medical history significant for osteoporosis, vertigo and arthritis who was brought in for evaluation of chest pain and a fall.  Pt admitted with NSTEMI, a-fib, and CHF.   Reviewed I/O's: -1.1 L x 24 hour  UOP: 1.3 Lx 24 hours  Pt resting quietly at time of visit.   RD provided "Low Sodium Nutrition Therapy" handout from [facility] Nutrition Care Manual; attached to AVS/ Discharge summary.   No results found for: "HGBA1C" PTA DM medications are none.   Labs reviewed: K: 3.3, CBGS: 128 (inpatient orders for glycemic control are none).    Current diet order is Heart healthy (liberalized to 2 gram sodium), patient is consuming approximately n/a% of meals at this time. Labs and medications reviewed.   No nutrition interventions warranted at this time. If nutrition issues arise, please consult RD.   Loistine Chance, RD, LDN, [place] Registered Dietitian II Certified Diabetes Care and Education Specialist Please refer to [facility] for RD and/or RD on-call/weekend/after hours pager

## 2022-02-25 NOTE — Progress Notes (Signed)
ANTICOAGULATION CONSULT NOTE  Pharmacy Consult for heparin infusion Indication: atrial fibrillation  No Known Allergies  Patient Measurements: Height: 5\' 2"  (157.5 cm) Weight: 63.4 kg (139 lb 12.4 oz) IBW/kg (Calculated) : 50.1 Heparin Dosing Weight: 62.9 kg   Vital Signs: Temp: 98 F (36.7 C) (10/24 2029) Temp Source: Oral (10/24 2029) BP: 100/62 (10/24 2300) Pulse Rate: 90 (10/24 2300)  Labs: Recent Labs    02/05/2022 0917 02/23/2022 0918 02/18/2022 1250 02/25/22 0343  HGB 16.4*  --   --  16.5*  HCT 49.6*  --   --  48.4*  PLT 238  --   --  207  HEPARINUNFRC  --   --   --  0.93*  CREATININE 1.02*  --   --   --   TROPONINIHS  --  11,143* >24,000*  --      Estimated Creatinine Clearance: 31.4 mL/min (A) (by C-G formula based on SCr of 1.02 mg/dL (H)).   Medical History: Past Medical History:  Diagnosis Date   Arthritis    Chicken pox    Degenerative arthritis of right knee 07/2020   Osteoporosis    Vertigo    x1 over 2 yrs    Assessment: 88 y.o. female with medical history significant for osteoporosis, vertigo and arthritis who was brought into the ER by EMS for evaluation of chest pain and a fall. Pharmacy is asked to start and monitor IV heparin. Patient is on no chronic anticoagulation prior to arrival  Goal of Therapy:  Heparin level 0.3-0.7 units/ml Monitor platelets by anticoagulation protocol: Yes   Plan:  10/25@0343 : HL 0.93, supratherapeutic Decreasing heparin infusion to 850 units/hr Check anti-Xa level in 8 hours after rate change Continue to monitor H&H and platelets(stable currently)  Lance Coon A Arrow Emmerich 02/25/2022,4:22 AM

## 2022-02-25 NOTE — TOC Initial Note (Signed)
Transition of Care (TOC) - Initial/Assessment Note    Patient Details  Name: Dawn Clark MRN: [id number] Date of Birth: 10/04/1928  Transition of Care (TOC) CM/SW Contact:     M , RN Phone Number: 02/25/2022, 5:16 PM  Clinical Narrative:                 Patient admitted to the hospital with STEMI s/p cardiac cath with PCI.  Patient is currently in the ICU.  RNCM met with patient and her grandson at the bedside today.  Patient reports that she lives alone and is independent and still drives during the day but not at night.  She gets around the home with her cane.  Her grandson lives right next door, her granddaughter lives behind her and her son also lives close by.  She has had home health services in the past after knee surgery.  She is agreeable to home health at discharge from the hospital, no preference in agency.  [place] with [facility] has accepted referral for RN, PT, and OT.    TOC will cont to follow.    Expected Discharge Plan: Home w Home Health Services Barriers to Discharge: Continued Medical Work up   Patient Goals and CMS Choice Patient states their goals for this hospitalization and ongoing recovery are:: get better and get home- agrees to home health at discharge CMS Medicare.gov Compare Post Acute Care list provided to:: Patient Choice offered to / list presented to : Patient  Expected Discharge Plan and Services Expected Discharge Plan: Home w Home Health Services   Discharge Planning Services: CM Consult Post Acute Care Choice: Home Health Living arrangements for the past 2 months: Single Family Home                           HH Arranged: RN, PT, OT HH Agency: [facility] Date HH Agency Contacted: 02/25/22 Time HH Agency Contacted: 1715 Representative spoke with at HH Agency: [place]  Prior Living Arrangements/Services Living arrangements for the past 2 months: Single Family Home Lives with:: Self Patient language and need  for interpreter reviewed:: Yes Do you feel safe going back to the place where you live?: Yes      Need for Family Participation in Patient Care: Yes (Comment) Care giver support system in place?: Yes (comment) Current home services: DME (cane) Criminal Activity/Legal Involvement Pertinent to Current Situation/Hospitalization: No - Comment as needed  Activities of Daily Living      Permission Sought/Granted Permission sought to share information with : Case Manager, Family Supports, Other (comment) Permission granted to share information with : Yes, Verbal Permission Granted  Share Information with NAME: Tony Lecy  Permission granted to share info w AGENCY: Home Health Agency  Permission granted to share info w Relationship: son  Permission granted to share info w Contact Information: [phone]  Emotional Assessment Appearance:: Appears stated age Attitude/Demeanor/Rapport: Engaged Affect (typically observed): Accepting Orientation: : Oriented to Self, Oriented to Place, Oriented to  Time, Oriented to Situation Alcohol / Substance Use: Not Applicable Psych Involvement: No (comment)  Admission diagnosis:  STEMI involving left anterior descending coronary artery (HCC) [I21.02] Patient Active Problem List   Diagnosis Date Noted   ST elevation myocardial infarction involving left anterior descending (LAD) coronary artery (HCC) 02/28/2022   ST elevation myocardial infarction (STEMI) (HCC) 02/13/2022   Acute systolic CHF (congestive heart failure) (HCC) 02/15/2022   Atrial fibrillation, new onset (HCC) 02/23/2022     Total knee replacement status 08/28/2020   Primary osteoarthritis of right knee 04/13/2020   Age-related osteoporosis without current pathological fracture 06/10/2017   Status post total left knee replacement 06/07/2017   PCP:  Tate, Denny C, MD Pharmacy:   [id number] - [place], [place] - [address] [place]  [address] Phone: [phone] Fax: [phone]  [facility] - [place], [place] - [address] [place] [address] Phone: [phone] Fax: [phone]     Social Determinants of Health (SDOH) Interventions    Readmission Risk Interventions     No data to display           

## 2022-02-25 NOTE — Progress Notes (Signed)
Patient alert and oriented x4, on 2L [place], denies pain. MD made aware of irregular heart rate and change in rhythms. EKGs placed  Labs ordered per MD, replacements ordered per MD request. Patient started on amiodarone drip per MD. Patient converted back to NSR.

## 2022-02-25 NOTE — Progress Notes (Addendum)
Onset for Electrolyte Monitoring and Replacement   Recent Labs: Potassium (mmol/L)  Date Value  02/25/2022 3.7   Magnesium (mg/dL)  Date Value  02/25/2022 2.0   Calcium (mg/dL)  Date Value  02/25/2022 8.6 (L)   Albumin (g/dL)  Date Value  03/05/2022 3.7   Sodium (mmol/L)  Date Value  02/25/2022 141    Assessment: 89YOF w/ PMH of OA, vertigo presenting with chest pain s/p STEMI / emergent DCCV, new-onset atrial fibrillation followed by cardiology. Pharmacy is asked to follow and replace electrolytes while in CCU.  Diuretics: furosemide 20 mg IV once daily  Goal of Therapy:  Potassium 4.0 - 5.1 mmol/L Magnesium 2.0 - 2.4 mg/dL All Other Electrolytes WNL  Plan:  Repeat 40 mEq oral KCl x 1 Recheck electrolytes in am  Dallie Piles ,PharmD Clinical Pharmacist 02/25/2022 1:51 PM

## 2022-02-25 NOTE — Progress Notes (Signed)
ANTICOAGULATION CONSULT NOTE  Pharmacy Consult for heparin infusion Indication: atrial fibrillation  No Known Allergies  Patient Measurements: Height: 5\' 2"  (157.5 cm) Weight: 63.4 kg (139 lb 12.4 oz) IBW/kg (Calculated) : 50.1 Heparin Dosing Weight: 62.9 kg   Vital Signs: Temp: 98.1 F (36.7 C) (10/25 0400) Temp Source: Oral (10/25 0400) BP: 98/59 (10/25 0700) Pulse Rate: 89 (10/25 0700)  Labs: Recent Labs    03/01/2022 0917 03/02/2022 0918 02/20/2022 1250 02/25/22 0343  HGB 16.4*  --   --  16.5*  HCT 49.6*  --   --  48.4*  PLT 238  --   --  207  HEPARINUNFRC  --   --   --  0.93*  CREATININE 1.02*  --   --  1.14*  TROPONINIHS  --  11,143* >24,000*  --      Estimated Creatinine Clearance: 28.1 mL/min (A) (by C-G formula based on SCr of 1.14 mg/dL (H)).   Medical History: Past Medical History:  Diagnosis Date   Arthritis    Chicken pox    Degenerative arthritis of right knee 07/2020   Osteoporosis    Vertigo    x1 over 2 yrs    Assessment: 88 y.o. female with medical history significant for osteoporosis, vertigo and arthritis who was brought into the ER by EMS for evaluation of chest pain and a fall. Pharmacy is asked to start and monitor IV heparin. Patient is on no chronic anticoagulation prior to arrival  Goal of Therapy:  Heparin level 0.3-0.7 units/ml Monitor platelets by anticoagulation protocol: Yes   Plan: heparin level remains SUPRAtherapeutic despite recent rate decrease Decreasing heparin infusion to 750 units/hr Check anti-Xa level in 8 hours after rate change Continue to monitor H&H and platelets (stable currently)  Dallie Piles 02/25/2022,7:18 AM

## 2022-02-25 NOTE — Progress Notes (Signed)
PROGRESS NOTE    Dawn Clark  EUM:[id number] DOB: [birth year] DOA: 02-25-22  PCP: Jaclyn Shaggy, MD    Brief Narrative:  This 88 years old female with PMH significant for osteoporosis, vertigo, arthritis presented in the ED with complaint of chest pain and s/p fall.  Patient describes chest pain for last 2 days mostly in both breasts and denies having any associated symptoms except shortness of breath.  Pain was persistent and nonradiating.  She has called her neighbors to take her to the urgent care for the evaluation while walking towards her car patient lost her balance and fell backwards hitting her head.  Her friend reported she has lost consciousness for few minutes.  But patient does not remember.  Patient is brought in the ED.  Twelve-lead EKG shows ST elevation in the anterior leads.  Patient was emergently taken to the left heart cath, she underwent successful PCI intervention.  Assessment & Plan:   Principal Problem:   ST elevation myocardial infarction (STEMI) (HCC) Active Problems:   Acute systolic CHF (congestive heart failure) (HCC)   Atrial fibrillation, new onset (HCC)  STEMI involving LAD: Patient presented for the evaluation of chest pain and was noted to have EKG changes concerning for anterior lateral STEMI, she was emergently taken to the left heart cath and found to have occluded ostial/proximal LAD.  Patient successfully underwent PCI with stent angioplasty. Cardiology recommended continue aspirin and Brilinta, metoprolol and high intensity statins  Atrial fibrillation, new onset: She presented to the ED for evaluation of chest pain and was found to have new onset rapid A-fib. She is s/p cardioversion and currently in sinus rhythm. Continue heparin infusion, Toprol-XL 12.5 mg twice daily. Plan to start Eliquis upon discharge.  Acute systolic CHF: Patient presented for the evaluation of chest pain and found to have NSTEMI and A-fib, complicated by acute CHF  due to her reduced LVEF. Continue IV Lasix 20mg  twice daily.   Continue metoprolol 12.5 MG BID.   DVT prophylaxis: Heparin gtt. Code Status: Full code Family Communication: No family at bedside Disposition Plan:   Status is: Inpatient Remains inpatient appropriate because: Admitted for NSTEMI underwent left heart cath found to have proximal LAD obstruction underwent stent angioplasty.  Tolerated well   Consultants:  Cardiology  Procedures: Stent angioplasty, s/p ablation Antimicrobials: None  Subjective: Patient was seen and examined at bedside.  Overnight events noted.   Patient reports feeling much improved.  Denies any chest pain.  Objective: Vitals:   02/25/22 1100 02/25/22 1200 02/25/22 1312 02/25/22 1400  BP: 99/72 94/60 98/70  (!) 88/56  Pulse: 93 90 (!) 110 92  Resp: (!) 27 (!) 23  13  Temp:  98.2 F (36.8 C)    TempSrc:  Oral    SpO2: 97% 97%  94%  Weight:      Height:        Intake/Output Summary (Last 24 hours) at 02/25/2022 1458 Last data filed at 02/25/2022 0600 Gross per 24 hour  Intake 94.87 ml  Output 1300 ml  Net -1205.13 ml   Filed Weights   02-25-2022 0911 Feb 25, 2022 1500  Weight: 63.4 kg 63.4 kg    Examination:  General exam: Appears comfortable, not in any acute distress, deconditioned. Respiratory system: CTA bilaterally, respiratory effort normal, RR 15 Cardiovascular system: S1 & S2 heard, regular rate and rhythm, no murmur. Gastrointestinal system: Abdomen is soft, non tender non distended, BS+ Central nervous system: Alert and oriented X 3. No focal neurological deficits.  Extremities: No edema, no cyanosis, no clubbing Skin: No rashes, lesions or ulcers Psychiatry: Judgement and insight appear normal. Mood & affect appropriate.     Data Reviewed: I have personally reviewed following labs and imaging studies  CBC: Recent Labs  Lab 02/08/2022 0917 02/25/22 0343  WBC 14.1* 23.0*  NEUTROABS 10.9*  --   HGB 16.4* 16.5*  HCT 49.6*  48.4*  MCV 96.7 91.0  PLT 238 947   Basic Metabolic Panel: Recent Labs  Lab 02/13/2022 0917 02/25/22 0343 02/25/22 1232  NA 141 141  --   K 3.7 3.3* 3.7  CL 108 103  --   CO2 24 26  --   GLUCOSE 162* 125*  --   BUN 20 24*  --   CREATININE 1.02* 1.14*  --   CALCIUM 8.6* 8.6*  --   MG 2.0  --  2.0   GFR: Estimated Creatinine Clearance: 28.1 mL/min (A) (by C-G formula based on SCr of 1.14 mg/dL (H)). Liver Function Tests: Recent Labs  Lab 02/11/2022 0917  AST 151*  ALT 35  ALKPHOS 45  BILITOT 0.7  PROT 6.7  ALBUMIN 3.7   No results for input(s): "LIPASE", "AMYLASE" in the last 168 hours. No results for input(s): "AMMONIA" in the last 168 hours. Coagulation Profile: No results for input(s): "INR", "PROTIME" in the last 168 hours. Cardiac Enzymes: No results for input(s): "CKTOTAL", "CKMB", "CKMBINDEX", "TROPONINI" in the last 168 hours. BNP (last 3 results) No results for input(s): "PROBNP" in the last 8760 hours. HbA1C: No results for input(s): "HGBA1C" in the last 72 hours. CBG: Recent Labs  Lab 02/06/2022 0937 02/23/2022 1143  GLUCAP 142* 128*   Lipid Profile: No results for input(s): "CHOL", "HDL", "LDLCALC", "TRIG", "CHOLHDL", "LDLDIRECT" in the last 72 hours. Thyroid Function Tests: Recent Labs    02/13/2022 0917  TSH 5.113*   Anemia Panel: No results for input(s): "VITAMINB12", "FOLATE", "FERRITIN", "TIBC", "IRON", "RETICCTPCT" in the last 72 hours. Sepsis Labs: No results for input(s): "PROCALCITON", "LATICACIDVEN" in the last 168 hours.  Recent Results (from the past 240 hour(s))  MRSA Next Gen by PCR, Nasal     Status: None   Collection Time: 02/17/2022 11:48 AM   Specimen: Nasal Mucosa; Nasal Swab  Result Value Ref Range Status   MRSA by PCR Next Gen NOT DETECTED NOT DETECTED Final    Comment: (NOTE) The GeneXpert MRSA Assay (FDA approved for NASAL specimens only), is one component of a comprehensive MRSA colonization surveillance program. It is not  intended to diagnose MRSA infection nor to guide or monitor treatment for MRSA infections. Test performance is not FDA approved in patients less than 64 years old. Performed at [facility], [address], [place], [place] [address]     Radiology Studies: ECHOCARDIOGRAM LIMITED  Result Date: 02/25/2022    ECHOCARDIOGRAM LIMITED REPORT   Patient Name:   Dawn Clark Date of Exam: 02/25/2022 Medical Rec #:  [id number]      Height:       62.0 in Accession #:    [id number]     Weight:       139.8 lb Date of Birth:  [birth year]      BSA:          1.642 m Patient Age:    12 years       BP:           90/67 mmHg Patient Gender: F  HR:           96 bpm. Exam Location:  [facility] Procedure: Limited Echo, Cardiac Doppler, Color Doppler and Intracardiac            Opacification Agent Indications:     Acute myocardial Infarction --unspecified I21.9  History:         Patient has prior history of Echocardiogram examinations, most                  recent 02/03/2022. Arrythmias:Atrial Fibrillation. STEMI.  Sonographer:     Cristela BlueJerry Hege Referring Phys:  [id number] CHRISTOPHER END Diagnosing Phys: Debbe OdeaBrian Agbor-Etang MD IMPRESSIONS  1. No LV thrombus noted. Left ventricular ejection fraction, by estimation, is 25 to 30%. The left ventricle has severely decreased function.  2. Right ventricular systolic function is normal.  3. Left atrial size was severely dilated.  4. The mitral valve is normal in structure. Mild mitral valve regurgitation.  5. Tricuspid valve regurgitation is mild to moderate. FINDINGS  Left Ventricle: No LV thrombus noted. Left ventricular ejection fraction, by estimation, is 25 to 30%. The left ventricle has severely decreased function. Definity contrast agent was given IV to delineate the left ventricular endocardial borders. Right Ventricle: Right ventricular systolic function is normal. Left Atrium: Left atrial size was severely dilated. Right Atrium: Right atrial size was normal in size.  Mitral Valve: The mitral valve is normal in structure. Mild mitral valve regurgitation. Tricuspid Valve: The tricuspid valve is normal in structure. Tricuspid valve regurgitation is mild to moderate. LEFT VENTRICLE PLAX 2D LVIDd:         4.30 cm LVIDs:         3.80 cm LV PW:         0.80 cm LV IVS:        0.60 cm LVOT diam:     1.90 cm LVOT Area:     2.84 cm  LV Volumes (MOD) LV vol d, MOD A2C: 31.2 ml LV vol d, MOD A4C: 56.4 ml LV vol s, MOD A2C: 16.4 ml LV vol s, MOD A4C: 45.4 ml LV SV MOD A2C:     14.8 ml LV SV MOD A4C:     56.4 ml LV SV MOD BP:      13.3 ml LEFT ATRIUM              Index        RIGHT ATRIUM          Index LA diam:        3.10 cm  1.89 cm/m   RA Area:     9.38 cm LA Vol (A2C):   129.0 ml 78.58 ml/m  RA Volume:   17.90 ml 10.90 ml/m LA Vol (A4C):   83.8 ml  51.05 ml/m LA Biplane Vol: 111.0 ml 67.61 ml/m   AORTA Ao Root diam: 2.60 cm TRICUSPID VALVE TR Peak grad:   29.8 mmHg TR Vmax:        273.00 cm/s  SHUNTS Systemic Diam: 1.90 cm Debbe OdeaBrian Agbor-Etang MD Electronically signed by Debbe OdeaBrian Agbor-Etang MD Signature Date/Time: 02/25/2022/1:11:14 PM    Final    ECHOCARDIOGRAM COMPLETE  Result Date: 02/25/2022    ECHOCARDIOGRAM REPORT   Patient Name:   Dawn Clark Date of Exam: 02/11/2022 Medical Rec #:  [id number]      Height:       62.0 in Accession #:    [id number]     Weight:       139.7  lb Date of Birth:  05-21-1928      BSA:          1.641 m Patient Age:    89 years       BP:           102/70 mmHg Patient Gender: F              HR:           82 bpm. Exam Location:  [facility] Procedure: 2D Echo, Cardiac Doppler, Color Doppler and Intracardiac            Opacification Agent Indications:     Acute myocardial Infarction -unspecified I21.9  History:         Patient has no prior history of Echocardiogram examinations.                  CHF, Coronary stent Mar 03, 2022; Arrythmias:Atrial Fibrillation.                  No cardiac history listed in chart.  Sonographer:     Cristela Blue Sonographer#2:    Ceasar Mons Referring Phys:  [id number] CHRISTOPHER END Diagnosing Phys: Yvonne Kendall MD  Sonographer Comments: Image quality was fair. IMPRESSIONS  1. Left ventricular ejection fraction, by estimation, is 25 to 30%. The left ventricle has severely decreased function. The left ventricle demonstrates regional wall motion abnormalities (see scoring diagram/findings for description). Left ventricular diastolic parameters are consistent with Grade I diastolic dysfunction (impaired relaxation). There is severe hypokinesis of the left ventricular, entire anteroseptal wall, anterior wall, anterolateral wall and apical segment.  2. There is hypokinesis of the right ventricular apex with otherwise preserved RV contraction. The right ventricular size is normal. There is mildly elevated pulmonary artery systolic pressure.  3. The mitral valve is degenerative. Mild mitral valve regurgitation. No evidence of mitral stenosis. Moderate mitral annular calcification.  4. Tricuspid valve regurgitation is mild to moderate.  5. The aortic valve is tricuspid. Aortic valve regurgitation is mild. No aortic stenosis is present.  6. The inferior vena cava is normal in size with <50% respiratory variability, suggesting right atrial pressure of 8 mmHg. FINDINGS  Left Ventricle: There appears to be a prominent trabecular or false tendon near the LV apex; no definite thrombus is identified. Left ventricular ejection fraction, by estimation, is 25 to 30%. The left ventricle has severely decreased function. The left ventricle demonstrates regional wall motion abnormalities. Severe hypokinesis of the left ventricular, entire anteroseptal wall, anterior wall, anterolateral wall and apical segment. Definity contrast agent was given IV to delineate the left ventricular endocardial borders. The left ventricular internal cavity size was normal in size. There is no left ventricular hypertrophy. Left ventricular diastolic parameters are consistent with  Grade I diastolic dysfunction (impaired relaxation). Right Ventricle: There is hypokinesis of the right ventricular apex with otherwise preserved RV contraction. The right ventricular size is normal. No increase in right ventricular wall thickness. There is mildly elevated pulmonary artery systolic pressure. The tricuspid regurgitant velocity is 2.87 m/s, and with an assumed right atrial pressure of 8 mmHg, the estimated right ventricular systolic pressure is 40.9 mmHg. Left Atrium: Left atrial size was normal in size. Right Atrium: Right atrial size was normal in size. Pericardium: There is no evidence of pericardial effusion. Presence of epicardial fat layer. Mitral Valve: The mitral valve is degenerative in appearance. There is mild thickening of the mitral valve leaflet(s). Moderate mitral annular calcification. Mild mitral valve regurgitation. No evidence of mitral  valve stenosis. Tricuspid Valve: The tricuspid valve is not well visualized. Tricuspid valve regurgitation is mild to moderate. Aortic Valve: The aortic valve is tricuspid. Aortic valve regurgitation is mild. No aortic stenosis is present. Aortic valve mean gradient measures 2.0 mmHg. Aortic valve peak gradient measures 3.0 mmHg. Aortic valve area, by VTI measures 2.37 cm. Pulmonic Valve: The pulmonic valve was not well visualized. Pulmonic valve regurgitation is trivial. No evidence of pulmonic stenosis. Aorta: The aortic root is normal in size and structure. Venous: The inferior vena cava is normal in size with less than 50% respiratory variability, suggesting right atrial pressure of 8 mmHg. IAS/Shunts: No atrial level shunt detected by color flow Doppler.  LEFT VENTRICLE PLAX 2D LVIDd:         4.20 cm     Diastology LVIDs:         3.80 cm     LV e' medial:    5.22 cm/s LV PW:         0.90 cm     LV E/e' medial:  10.1 LV IVS:        0.80 cm     LV e' lateral:   4.57 cm/s LVOT diam:     2.00 cm     LV E/e' lateral: 11.6 LV SV:         37 LV SV  Index:   23 LVOT Area:     3.14 cm  LV Volumes (MOD) LV vol d, MOD A2C: 59.5 ml LV vol d, MOD A4C: 61.9 ml LV vol s, MOD A2C: 49.1 ml LV vol s, MOD A4C: 40.1 ml LV SV MOD A2C:     10.4 ml LV SV MOD A4C:     61.9 ml LV SV MOD BP:      18.6 ml RIGHT VENTRICLE RV Basal diam:  2.60 cm RV Mid diam:    1.70 cm RV S prime:     14.70 cm/s TAPSE (M-mode): 2.5 cm LEFT ATRIUM             Index        RIGHT ATRIUM          Index LA diam:        2.70 cm 1.65 cm/m   RA Area:     6.82 cm LA Vol (A2C):   31.8 ml 19.37 ml/m  RA Volume:   11.80 ml 7.19 ml/m LA Vol (A4C):   28.4 ml 17.30 ml/m LA Biplane Vol: 31.5 ml 19.19 ml/m  AORTIC VALVE AV Area (Vmax):    2.58 cm AV Area (Vmean):   2.33 cm AV Area (VTI):     2.37 cm AV Vmax:           86.50 cm/s AV Vmean:          63.200 cm/s AV VTI:            0.158 m AV Peak Grad:      3.0 mmHg AV Mean Grad:      2.0 mmHg LVOT Vmax:         71.10 cm/s LVOT Vmean:        46.900 cm/s LVOT VTI:          0.119 m LVOT/AV VTI ratio: 0.75  AORTA Ao Root diam: 3.10 cm MITRAL VALVE               TRICUSPID VALVE MV Area (PHT): 6.02 cm    TR Peak grad:   32.9 mmHg MV  Decel Time: 126 msec    TR Vmax:        287.00 cm/s MV E velocity: 52.90 cm/s MV A velocity: 86.50 cm/s  SHUNTS MV E/A ratio:  0.61        Systemic VTI:  0.12 m                            Systemic Diam: 2.00 cm Yvonne Kendall MD Electronically signed by Yvonne Kendall MD Signature Date/Time: Mar 06, 2022/3:38:58 PM    Final (Updated)    DG Chest Portable 1 View  Result Date: 03-06-22 CLINICAL DATA:  Chest pain.  Post cardiac catheterization. EXAM: PORTABLE CHEST 1 VIEW COMPARISON:  None Available. FINDINGS: Borderline enlarged cardiac silhouette and mediastinal contours. There is thickening of the right paratracheal stripe, presumably secondary to prominent vasculature. There is mild rightward deviation of the tracheal air column at the level of the aortic arch. Pulmonary vasculature is indistinct with cephalization of flow.  Perihilar opacities favored to represent atelectasis. No definite pleural effusion or pneumothorax. Degenerative change the bilateral glenohumeral joints is suspected though incompletely evaluated. IMPRESSION: Cardiomegaly with findings suggestive of pulmonary edema and perihilar atelectasis. Electronically Signed   By: Simonne Come M.D.   On: 2022-03-06 12:10   CARDIAC CATHETERIZATION  Result Date: 2022/03/06 Conclusions: Severe single-vessel coronary artery disease with thrombotic occlusion of the ostial/proximal LAD followed by sequential 30% mid and 70% mid/distal LAD stenoses.  30% ostial stenosis is also observed in OM1 branch of dominant LCx. Severely reduced LVEF with anterior hypokinesis/akinesis.  LVEF 25-30% with moderately elevated filling pressure (LVEDP 32 mmHg). Successful PCI to ostial/proximal LAD using Onyx Frontier 2.5 x 15 mm drug-eluting stent (postdilated to 2.8 mm) with 0% residual stenosis and TIMI-2 flow in the distal vessel, likely related to microvascular dysfunction in the setting of late presenting STEMI. Recommendations: Admit to ICU for STEMI/acute HFrEF management status post primary PCI. Continue cangrelor infusion for 2 hours from time of ticagrelor administration. Restart IV heparin 8 hours after right femoral artery hemostasis was achieved given atrial fibrillation with rapid ventricular response and emergent cardioversion today. Follow-up echocardiogram. Continue diuresis and initiation of goal-directed medical therapy for acute HFrEF due to ischemic cardiomyopathy as blood pressure and renal function allow. New dual antiplatelet therapy with aspirin and ticagrelor for now.  Based on results of echocardiogram (attention to LV thrombus) I would favor at least 12 months of warfarin versus DOAC plus clopidgrel at the time of discharge. Yvonne Kendall, MD [facility]  CT Head Wo Contrast  Result Date: 06-Mar-2022 CLINICAL DATA:  Head trauma, minor (Age >= 65y); Neck  trauma (Age >= 65y) EXAM: CT HEAD WITHOUT CONTRAST CT CERVICAL SPINE WITHOUT CONTRAST TECHNIQUE: Multidetector CT imaging of the head and cervical spine was performed following the standard protocol without intravenous contrast. Multiplanar CT image reconstructions of the cervical spine were also generated. RADIATION DOSE REDUCTION: This exam was performed according to the departmental dose-optimization program which includes automated exposure control, adjustment of the mA and/or kV according to patient size and/or use of iterative reconstruction technique. COMPARISON:  Head CT 05/08/2021. FINDINGS: CT HEAD FINDINGS Brain: No evidence of acute intracranial hemorrhage or extra-axial collection.No evidence of mass lesion/concerning mass effect.The ventricles are normal in size.Scattered subcortical and periventricular white matter hypodensities, nonspecific but likely sequela of chronic small vessel ischemic disease.Mild cerebral atrophy Vascular: No hyperdense vessel or unexpected calcification. Skull: Normal. Negative for fracture or focal lesion. Sinuses/Orbits: No acute  finding. Other: None. CT CERVICAL SPINE FINDINGS Alignment: Degenerative grade 1 anterolisthesis at C4-C5 and C6-C7 and C7-T1. Skull base and vertebrae: No acute fracture. No primary bone lesion or focal pathologic process. Soft tissues and spinal canal: No prevertebral fluid or swelling. No visible canal hematoma. Disc levels: There is mild to moderate multilevel degenerative disc disease worst at C5-C6. There is moderate-severe multilevel facet arthropathy bilaterally. C1-C2 degenerative changes. Upper chest: There is interlobular septal thickening and ground-glass opacities in the lung apices suggesting pulmonary edema. Other: None. IMPRESSION: No acute intracranial abnormality. Stable mild sequela of chronic small vessel ischemic disease. No acute cervical spine fracture. Multilevel degenerative disc disease and facet arthropathy. These  results were called by telephone at the time of interpretation on 02/22/2022 at 10:13 am to provider Dr. Okey Dupre, who verbally acknowledged these results. Electronically Signed   By: Caprice Renshaw M.D.   On: 02/20/2022 10:22   CT Cervical Spine Wo Contrast  Result Date: 02/10/2022 CLINICAL DATA:  Head trauma, minor (Age >= 65y); Neck trauma (Age >= 65y) EXAM: CT HEAD WITHOUT CONTRAST CT CERVICAL SPINE WITHOUT CONTRAST TECHNIQUE: Multidetector CT imaging of the head and cervical spine was performed following the standard protocol without intravenous contrast. Multiplanar CT image reconstructions of the cervical spine were also generated. RADIATION DOSE REDUCTION: This exam was performed according to the departmental dose-optimization program which includes automated exposure control, adjustment of the mA and/or kV according to patient size and/or use of iterative reconstruction technique. COMPARISON:  Head CT 05/08/2021. FINDINGS: CT HEAD FINDINGS Brain: No evidence of acute intracranial hemorrhage or extra-axial collection.No evidence of mass lesion/concerning mass effect.The ventricles are normal in size.Scattered subcortical and periventricular white matter hypodensities, nonspecific but likely sequela of chronic small vessel ischemic disease.Mild cerebral atrophy Vascular: No hyperdense vessel or unexpected calcification. Skull: Normal. Negative for fracture or focal lesion. Sinuses/Orbits: No acute finding. Other: None. CT CERVICAL SPINE FINDINGS Alignment: Degenerative grade 1 anterolisthesis at C4-C5 and C6-C7 and C7-T1. Skull base and vertebrae: No acute fracture. No primary bone lesion or focal pathologic process. Soft tissues and spinal canal: No prevertebral fluid or swelling. No visible canal hematoma. Disc levels: There is mild to moderate multilevel degenerative disc disease worst at C5-C6. There is moderate-severe multilevel facet arthropathy bilaterally. C1-C2 degenerative changes. Upper chest: There  is interlobular septal thickening and ground-glass opacities in the lung apices suggesting pulmonary edema. Other: None. IMPRESSION: No acute intracranial abnormality. Stable mild sequela of chronic small vessel ischemic disease. No acute cervical spine fracture. Multilevel degenerative disc disease and facet arthropathy. These results were called by telephone at the time of interpretation on 02/04/2022 at 10:13 am to provider Dr. Okey Dupre, who verbally acknowledged these results. Electronically Signed   By: Caprice Renshaw M.D.   On: 02/23/2022 10:22    Scheduled Meds:  aspirin  81 mg Oral Daily   atorvastatin  80 mg Oral Daily   Chlorhexidine Gluconate Cloth  6 each Topical Daily   [START ON 02/26/2022] furosemide  20 mg Intravenous Daily   metoprolol succinate  12.5 mg Oral BID   sodium chloride flush  3 mL Intravenous Q12H   ticagrelor  90 mg Oral BID   Continuous Infusions:  sodium chloride     amiodarone 60 mg/hr (02/25/22 1348)   amiodarone     heparin 850 Units/hr (02/25/22 0600)     LOS: 1 day    Time spent: 50 mins    Jacquiline Zurcher, MD [facility]   If 7PM-7AM, please  contact night-coverage

## 2022-02-26 DIAGNOSIS — I213 ST elevation (STEMI) myocardial infarction of unspecified site: Secondary | ICD-10-CM | POA: Diagnosis not present

## 2022-02-26 DIAGNOSIS — I4729 Other ventricular tachycardia: Secondary | ICD-10-CM

## 2022-02-26 DIAGNOSIS — I2102 ST elevation (STEMI) myocardial infarction involving left anterior descending coronary artery: Secondary | ICD-10-CM | POA: Diagnosis not present

## 2022-02-26 DIAGNOSIS — I255 Ischemic cardiomyopathy: Secondary | ICD-10-CM

## 2022-02-26 DIAGNOSIS — I4891 Unspecified atrial fibrillation: Secondary | ICD-10-CM | POA: Diagnosis not present

## 2022-02-26 LAB — BASIC METABOLIC PANEL
Anion gap: 11 (ref 5–15)
BUN: 35 mg/dL — ABNORMAL HIGH (ref 8–23)
CO2: 25 mmol/L (ref 22–32)
Calcium: 8.2 mg/dL — ABNORMAL LOW (ref 8.9–10.3)
Chloride: 104 mmol/L (ref 98–111)
Creatinine, Ser: 1.21 mg/dL — ABNORMAL HIGH (ref 0.44–1.00)
GFR, Estimated: 42 mL/min — ABNORMAL LOW (ref >60)
Glucose, Bld: 114 mg/dL — ABNORMAL HIGH (ref 70–99)
Potassium: 3.7 mmol/L (ref 3.5–5.1)
Sodium: 140 mmol/L (ref 135–145)

## 2022-02-26 LAB — LIPOPROTEIN A (LPA): Lipoprotein (a): 97.6 nmol/L — ABNORMAL HIGH (ref <75.0)

## 2022-02-26 LAB — CBC
HCT: 44.5 % (ref 36.0–46.0)
Hemoglobin: 14.9 g/dL (ref 12.0–15.0)
MCH: 31.2 pg (ref 26.0–34.0)
MCHC: 33.5 g/dL (ref 30.0–36.0)
MCV: 93.3 fL (ref 80.0–100.0)
Platelets: 183 10*3/uL (ref 150–400)
RBC: 4.77 MIL/uL (ref 3.87–5.11)
RDW: 12.9 % (ref 11.5–15.5)
WBC: 19.8 10*3/uL — ABNORMAL HIGH (ref 4.0–10.5)
nRBC: 0 % (ref 0.0–0.2)

## 2022-02-26 LAB — T4, FREE: Free T4: 1.41 ng/dL — ABNORMAL HIGH (ref 0.61–1.12)

## 2022-02-26 LAB — MAGNESIUM: Magnesium: 2.3 mg/dL (ref 1.7–2.4)

## 2022-02-26 LAB — HEPARIN LEVEL (UNFRACTIONATED)
Heparin Unfractionated: 0.36 IU/mL (ref 0.30–0.70)
Heparin Unfractionated: 0.46 IU/mL (ref 0.30–0.70)

## 2022-02-26 MED ORDER — METOPROLOL SUCCINATE ER 25 MG PO TB24
12.5000 mg | ORAL_TABLET | Freq: Every day | ORAL | Status: DC
  Administered 2022-02-26 – 2022-03-01 (×4): 12.5 mg via ORAL
  Filled 2022-02-26 (×4): qty 0.5

## 2022-02-26 MED ORDER — POTASSIUM CHLORIDE 20 MEQ PO PACK
40.0000 meq | PACK | Freq: Once | ORAL | Status: AC
  Administered 2022-02-26: 40 meq via ORAL
  Filled 2022-02-26: qty 2

## 2022-02-26 NOTE — Consult Note (Signed)
   Heart Failure Nurse Navigator Note  HfrEF 25 to 30%.  Regional wall motion abnormalities.  Grade 1 diastolic dysfunction.  Hypokinesis of the right ventricular apex.  Mildly elevated pulmonary artery systolic pressures.  Mild mitral regurgitation.  Mild aortic insufficiency.  Mild to moderate tricuspid regurgitation.  She presented to the emergency room with complaints of chest pain and shortness of breath for 2 days.  She was noted to be in A-fib RVR.   DCCV was performed.  Was taken emergently to the cardiac catheterization lab she received stent/PCI to the LAD.  Chest x-ray revealed cardiomegaly and suggestive of pulmonary edema.  Comorbidities:  ST elevation MI with PCI/stent to LAD Arthritis Osteoporosis New onset A-fib RVR  Medications:  Amiodarone infusion Aspirin 81 mg daily Atorvastatin 80 mg daily Furosemide 20 mg IV daily Metoprolol succinate 12.5 mg daily Brilinta 90 mg 2 times daily  Labs:  Sodium 140, potassium 3.7, chloride 104, CO2 25, BUN 35, creatinine 1.21, GFR 42, museum 2.3 Weight is not documented Blood pressure 90/63 Intake 640 mL Output 900 mL   Initial meeting with patient, she was lying quietly in the ICU in no acute distress.  She states that she is just really been sleepy, denied any discomforts.  States that [place], and has family members living around her. She has been a widow for 12 years.  Discussed heart failure, function of her heart.  She states that she fixes her own meals and takes care of her home.  She states that her family no longer lets her work in the garden.  She still drives but only in the daytime.  She states that she mostly snacks on things like chicken nuggets  and ketchup but does fix 1 big meal once  a week for nearby neighbors but she states that she is snacks so much before they get there that she does not eat the meal.  Discussed healthy choices   Discussed making healthy choices, she states that  she likes her salt and likes to use it at the table.  Explained the reasoning behind removing the saltshaker from the table and eating foods that contain less sodium.  Discussed sodium restriction of no more than 2000 mg a day.  Also discussed fluid restriction of no more than 64 ounces or eight 8 ounce cups in a days time.  Discussed all that is included as a liquid.  Also went over daily weights and what to report.  Along with reporting changes in symptoms such as increased weight gain, swelling, increasing shortness of breath, PND and orthopnea etc. she voices understanding.  Also discussed follow-up in the outpatient heart failure clinic for which she has an appointment on November 1 at 2:30 in the afternoon.  She has a 0% no-show which is 0 out of 13 appointments.  She was given the living with heart failure teaching booklet zone magnet, info on low-sodium and heart failure along with weight chart.  No further questions.  Pricilla Riffle RN [facility]

## 2022-02-26 NOTE — Progress Notes (Addendum)
Rounding Note    Patient Name: Dawn Clark Date of Encounter: 02/26/2022  [facility] Cardiologist: New  Subjective   Patient denies chest pain. Cath site with some bruising. Has not been up and about. BP is low.  Inpatient Medications    Scheduled Meds:  aspirin  81 mg Oral Daily   atorvastatin  80 mg Oral Daily   Chlorhexidine Gluconate Cloth  6 each Topical Daily   furosemide  20 mg Intravenous Daily   metoprolol succinate  12.5 mg Oral BID   potassium chloride  40 mEq Oral Once   sodium chloride flush  3 mL Intravenous Q12H   ticagrelor  90 mg Oral BID   Continuous Infusions:  sodium chloride     amiodarone 30 mg/hr (02/26/22 0725)   heparin 750 Units/hr (02/26/22 0500)   PRN Meds: sodium chloride, acetaminophen, ondansetron (ZOFRAN) IV, mouth rinse, sodium chloride flush   Vital Signs    Vitals:   02/26/22 0200 02/26/22 0300 02/26/22 0400 02/26/22 0500  BP: (!) 88/64 90/63 (!) 87/56 (!) 86/65  Pulse: 82 83 82 82  Resp: (!) 21 (!) 25 15 18   Temp: 98.9 F (37.2 C)     TempSrc: Oral     SpO2: 100% 100% 100% 100%  Weight:      Height:        Intake/Output Summary (Last 24 hours) at 02/26/2022 0743 Last data filed at 02/26/2022 02/28/2022 Gross per 24 hour  Intake 640.39 ml  Output 900 ml  Net -259.61 ml      02-Mar-2022    3:00 PM 2022-03-02    9:11 AM 08/28/2020   10:28 AM  Last 3 Weights  Weight (lbs) 139 lb 12.4 oz 139 lb 11.2 oz 143 lb  Weight (kg) 63.4 kg 63.368 kg 64.864 kg      Telemetry    NSR, HR 80s, brief SVT- Personally Reviewed  ECG    No new - Personally Reviewed  Physical Exam   GEN: No acute distress.   Neck: No JVD Cardiac: RRR, no murmurs, rubs, or gallops.  Respiratory: Clear to auscultation bilaterally. GI: Soft, nontender, non-distended  MS: No edema; No deformity. Neuro:  Nonfocal  Psych: Normal affect   Labs    High Sensitivity Troponin:   Recent Labs  Lab 03-02-22 0918 2022/03/02 1250   TROPONINIHS 11,143* >24,000*     Chemistry Recent Labs  Lab 2022-03-02 0917 02/25/22 0343 02/25/22 1232 02/26/22 0440  NA 141 141  --  140  K 3.7 3.3* 3.7 3.7  CL 108 103  --  104  CO2 24 26  --  25  GLUCOSE 162* 125*  --  114*  BUN 20 24*  --  35*  CREATININE 1.02* 1.14*  --  1.21*  CALCIUM 8.6* 8.6*  --  8.2*  MG 2.0  --  2.0 2.3  PROT 6.7  --   --   --   ALBUMIN 3.7  --   --   --   AST 151*  --   --   --   ALT 35  --   --   --   ALKPHOS 45  --   --   --   BILITOT 0.7  --   --   --   GFRNONAA 52* 45*  --  42*  ANIONGAP 9 12  --  11    Lipids No results for input(s): "CHOL", "TRIG", "HDL", "LABVLDL", "LDLCALC", "CHOLHDL" in the last 168  hours.  Hematology Recent Labs  Lab 02/10/2022 0917 02/25/22 0343 02/26/22 0440  WBC 14.1* 23.0* 19.8*  RBC 5.13* 5.32* 4.77  HGB 16.4* 16.5* 14.9  HCT 49.6* 48.4* 44.5  MCV 96.7 91.0 93.3  MCH 32.0 31.0 31.2  MCHC 33.1 34.1 33.5  RDW 12.4 12.5 12.9  PLT 238 207 183   Thyroid  Recent Labs  Lab 02/27/2022 0917 02/26/22 0440  TSH 5.113*  --   FREET4  --  1.41*    BNPNo results for input(s): "BNP", "PROBNP" in the last 168 hours.  DDimer No results for input(s): "DDIMER" in the last 168 hours.   Radiology    ECHOCARDIOGRAM LIMITED  Result Date: 02/25/2022    ECHOCARDIOGRAM LIMITED REPORT   Patient Name:   Dawn Clark Date of Exam: 02/25/2022 Medical Rec #:  [id number]      Height:       62.0 in Accession #:    [id number]     Weight:       139.8 lb Date of Birth:  25-Dec-1928      BSA:          1.642 m Patient Age:    88 years       BP:           90/67 mmHg Patient Gender: F              HR:           96 bpm. Exam Location:  [facility] Procedure: Limited Echo, Cardiac Doppler, Color Doppler and Intracardiac            Opacification Agent Indications:     Acute myocardial Infarction --unspecified I21.9  History:         Patient has prior history of Echocardiogram examinations, most                  recent 02/02/2022. Arrythmias:Atrial  Fibrillation. STEMI.  Sonographer:     Sherrie Sport Referring Phys:  [id number] CHRISTOPHER END Diagnosing Phys: Kate Sable MD IMPRESSIONS  1. No LV thrombus noted. Left ventricular ejection fraction, by estimation, is 25 to 30%. The left ventricle has severely decreased function.  2. Right ventricular systolic function is normal.  3. Left atrial size was severely dilated.  4. The mitral valve is normal in structure. Mild mitral valve regurgitation.  5. Tricuspid valve regurgitation is mild to moderate. FINDINGS  Left Ventricle: No LV thrombus noted. Left ventricular ejection fraction, by estimation, is 25 to 30%. The left ventricle has severely decreased function. Definity contrast agent was given IV to delineate the left ventricular endocardial borders. Right Ventricle: Right ventricular systolic function is normal. Left Atrium: Left atrial size was severely dilated. Right Atrium: Right atrial size was normal in size. Mitral Valve: The mitral valve is normal in structure. Mild mitral valve regurgitation. Tricuspid Valve: The tricuspid valve is normal in structure. Tricuspid valve regurgitation is mild to moderate. LEFT VENTRICLE PLAX 2D LVIDd:         4.30 cm LVIDs:         3.80 cm LV PW:         0.80 cm LV IVS:        0.60 cm LVOT diam:     1.90 cm LVOT Area:     2.84 cm  LV Volumes (MOD) LV vol d, MOD A2C: 31.2 ml LV vol d, MOD A4C: 56.4 ml LV vol s, MOD A2C: 16.4 ml LV vol s, MOD A4C: 45.4  ml LV SV MOD A2C:     14.8 ml LV SV MOD A4C:     56.4 ml LV SV MOD BP:      13.3 ml LEFT ATRIUM              Index        RIGHT ATRIUM          Index LA diam:        3.10 cm  1.89 cm/m   RA Area:     9.38 cm LA Vol (A2C):   129.0 ml 78.58 ml/m  RA Volume:   17.90 ml 10.90 ml/m LA Vol (A4C):   83.8 ml  51.05 ml/m LA Biplane Vol: 111.0 ml 67.61 ml/m   AORTA Ao Root diam: 2.60 cm TRICUSPID VALVE TR Peak grad:   29.8 mmHg TR Vmax:        273.00 cm/s  SHUNTS Systemic Diam: 1.90 cm Debbe Odea MD Electronically signed  by Debbe Odea MD Signature Date/Time: 02/25/2022/1:11:14 PM    Final    ECHOCARDIOGRAM COMPLETE  Result Date: 02/25/2022    ECHOCARDIOGRAM REPORT   Patient Name:   Dawn Clark Date of Exam: 02/08/2022 Medical Rec #:  [id number]      Height:       62.0 in Accession #:    [id number]     Weight:       139.7 lb Date of Birth:  02/01/1929      BSA:          1.641 m Patient Age:    88 years       BP:           102/70 mmHg Patient Gender: F              HR:           82 bpm. Exam Location:  [facility] Procedure: 2D Echo, Cardiac Doppler, Color Doppler and Intracardiac            Opacification Agent Indications:     Acute myocardial Infarction -unspecified I21.9  History:         Patient has no prior history of Echocardiogram examinations.                  CHF, Coronary stent 02/12/2022; Arrythmias:Atrial Fibrillation.                  No cardiac history listed in chart.  Sonographer:     Cristela Blue Sonographer#2:   Ceasar Mons Referring Phys:  [id number] CHRISTOPHER END Diagnosing Phys: Yvonne Kendall MD  Sonographer Comments: Image quality was fair. IMPRESSIONS  1. Left ventricular ejection fraction, by estimation, is 25 to 30%. The left ventricle has severely decreased function. The left ventricle demonstrates regional wall motion abnormalities (see scoring diagram/findings for description). Left ventricular diastolic parameters are consistent with Grade I diastolic dysfunction (impaired relaxation). There is severe hypokinesis of the left ventricular, entire anteroseptal wall, anterior wall, anterolateral wall and apical segment.  2. There is hypokinesis of the right ventricular apex with otherwise preserved RV contraction. The right ventricular size is normal. There is mildly elevated pulmonary artery systolic pressure.  3. The mitral valve is degenerative. Mild mitral valve regurgitation. No evidence of mitral stenosis. Moderate mitral annular calcification.  4. Tricuspid valve regurgitation is mild to moderate.   5. The aortic valve is tricuspid. Aortic valve regurgitation is mild. No aortic stenosis is present.  6. The inferior vena cava is normal in  size with <50% respiratory variability, suggesting right atrial pressure of 8 mmHg. FINDINGS  Left Ventricle: There appears to be a prominent trabecular or false tendon near the LV apex; no definite thrombus is identified. Left ventricular ejection fraction, by estimation, is 25 to 30%. The left ventricle has severely decreased function. The left ventricle demonstrates regional wall motion abnormalities. Severe hypokinesis of the left ventricular, entire anteroseptal wall, anterior wall, anterolateral wall and apical segment. Definity contrast agent was given IV to delineate the left ventricular endocardial borders. The left ventricular internal cavity size was normal in size. There is no left ventricular hypertrophy. Left ventricular diastolic parameters are consistent with Grade I diastolic dysfunction (impaired relaxation). Right Ventricle: There is hypokinesis of the right ventricular apex with otherwise preserved RV contraction. The right ventricular size is normal. No increase in right ventricular wall thickness. There is mildly elevated pulmonary artery systolic pressure. The tricuspid regurgitant velocity is 2.87 m/s, and with an assumed right atrial pressure of 8 mmHg, the estimated right ventricular systolic pressure is 40.9 mmHg. Left Atrium: Left atrial size was normal in size. Right Atrium: Right atrial size was normal in size. Pericardium: There is no evidence of pericardial effusion. Presence of epicardial fat layer. Mitral Valve: The mitral valve is degenerative in appearance. There is mild thickening of the mitral valve leaflet(s). Moderate mitral annular calcification. Mild mitral valve regurgitation. No evidence of mitral valve stenosis. Tricuspid Valve: The tricuspid valve is not well visualized. Tricuspid valve regurgitation is mild to moderate. Aortic  Valve: The aortic valve is tricuspid. Aortic valve regurgitation is mild. No aortic stenosis is present. Aortic valve mean gradient measures 2.0 mmHg. Aortic valve peak gradient measures 3.0 mmHg. Aortic valve area, by VTI measures 2.37 cm. Pulmonic Valve: The pulmonic valve was not well visualized. Pulmonic valve regurgitation is trivial. No evidence of pulmonic stenosis. Aorta: The aortic root is normal in size and structure. Venous: The inferior vena cava is normal in size with less than 50% respiratory variability, suggesting right atrial pressure of 8 mmHg. IAS/Shunts: No atrial level shunt detected by color flow Doppler.  LEFT VENTRICLE PLAX 2D LVIDd:         4.20 cm     Diastology LVIDs:         3.80 cm     LV e' medial:    5.22 cm/s LV PW:         0.90 cm     LV E/e' medial:  10.1 LV IVS:        0.80 cm     LV e' lateral:   4.57 cm/s LVOT diam:     2.00 cm     LV E/e' lateral: 11.6 LV SV:         37 LV SV Index:   23 LVOT Area:     3.14 cm  LV Volumes (MOD) LV vol d, MOD A2C: 59.5 ml LV vol d, MOD A4C: 61.9 ml LV vol s, MOD A2C: 49.1 ml LV vol s, MOD A4C: 40.1 ml LV SV MOD A2C:     10.4 ml LV SV MOD A4C:     61.9 ml LV SV MOD BP:      18.6 ml RIGHT VENTRICLE RV Basal diam:  2.60 cm RV Mid diam:    1.70 cm RV S prime:     14.70 cm/s TAPSE (M-mode): 2.5 cm LEFT ATRIUM             Index  RIGHT ATRIUM          Index LA diam:        2.70 cm 1.65 cm/m   RA Area:     6.82 cm LA Vol (A2C):   31.8 ml 19.37 ml/m  RA Volume:   11.80 ml 7.19 ml/m LA Vol (A4C):   28.4 ml 17.30 ml/m LA Biplane Vol: 31.5 ml 19.19 ml/m  AORTIC VALVE AV Area (Vmax):    2.58 cm AV Area (Vmean):   2.33 cm AV Area (VTI):     2.37 cm AV Vmax:           86.50 cm/s AV Vmean:          63.200 cm/s AV VTI:            0.158 m AV Peak Grad:      3.0 mmHg AV Mean Grad:      2.0 mmHg LVOT Vmax:         71.10 cm/s LVOT Vmean:        46.900 cm/s LVOT VTI:          0.119 m LVOT/AV VTI ratio: 0.75  AORTA Ao Root diam: 3.10 cm MITRAL VALVE                TRICUSPID VALVE MV Area (PHT): 6.02 cm    TR Peak grad:   32.9 mmHg MV Decel Time: 126 msec    TR Vmax:        287.00 cm/s MV E velocity: 52.90 cm/s MV A velocity: 86.50 cm/s  SHUNTS MV E/A ratio:  0.61        Systemic VTI:  0.12 m                            Systemic Diam: 2.00 cm Yvonne Kendallhristopher End MD Electronically signed by Yvonne Kendallhristopher End MD Signature Date/Time: 07/02/2021/3:38:58 PM    Final (Updated)    DG Chest Portable 1 View  Result Date: 07/02/2021 CLINICAL DATA:  Chest pain.  Post cardiac catheterization. EXAM: PORTABLE CHEST 1 VIEW COMPARISON:  None Available. FINDINGS: Borderline enlarged cardiac silhouette and mediastinal contours. There is thickening of the right paratracheal stripe, presumably secondary to prominent vasculature. There is mild rightward deviation of the tracheal air column at the level of the aortic arch. Pulmonary vasculature is indistinct with cephalization of flow. Perihilar opacities favored to represent atelectasis. No definite pleural effusion or pneumothorax. Degenerative change the bilateral glenohumeral joints is suspected though incompletely evaluated. IMPRESSION: Cardiomegaly with findings suggestive of pulmonary edema and perihilar atelectasis. Electronically Signed   By: Simonne ComeJohn  Watts M.D.   On: 07/02/2021 12:10   CARDIAC CATHETERIZATION  Result Date: 07/02/2021 Conclusions: Severe single-vessel coronary artery disease with thrombotic occlusion of the ostial/proximal LAD followed by sequential 30% mid and 70% mid/distal LAD stenoses.  30% ostial stenosis is also observed in OM1 branch of dominant LCx. Severely reduced LVEF with anterior hypokinesis/akinesis.  LVEF 25-30% with moderately elevated filling pressure (LVEDP 32 mmHg). Successful PCI to ostial/proximal LAD using Onyx Frontier 2.5 x 15 mm drug-eluting stent (postdilated to 2.8 mm) with 0% residual stenosis and TIMI-2 flow in the distal vessel, likely related to microvascular dysfunction in the  setting of late presenting STEMI. Recommendations: Admit to ICU for STEMI/acute HFrEF management status post primary PCI. Continue cangrelor infusion for 2 hours from time of ticagrelor administration. Restart IV heparin 8 hours after right femoral artery hemostasis was achieved given atrial  fibrillation with rapid ventricular response and emergent cardioversion today. Follow-up echocardiogram. Continue diuresis and initiation of goal-directed medical therapy for acute HFrEF due to ischemic cardiomyopathy as blood pressure and renal function allow. New dual antiplatelet therapy with aspirin and ticagrelor for now.  Based on results of echocardiogram (attention to LV thrombus) I would favor at least 12 months of warfarin versus DOAC plus clopidgrel at the time of discharge. Yvonne Kendall, MD [facility]  CT Head Wo Contrast  Result Date: 21-Mar-2022 CLINICAL DATA:  Head trauma, minor (Age >= 65y); Neck trauma (Age >= 65y) EXAM: CT HEAD WITHOUT CONTRAST CT CERVICAL SPINE WITHOUT CONTRAST TECHNIQUE: Multidetector CT imaging of the head and cervical spine was performed following the standard protocol without intravenous contrast. Multiplanar CT image reconstructions of the cervical spine were also generated. RADIATION DOSE REDUCTION: This exam was performed according to the departmental dose-optimization program which includes automated exposure control, adjustment of the mA and/or kV according to patient size and/or use of iterative reconstruction technique. COMPARISON:  Head CT 05/08/2021. FINDINGS: CT HEAD FINDINGS Brain: No evidence of acute intracranial hemorrhage or extra-axial collection.No evidence of mass lesion/concerning mass effect.The ventricles are normal in size.Scattered subcortical and periventricular white matter hypodensities, nonspecific but likely sequela of chronic small vessel ischemic disease.Mild cerebral atrophy Vascular: No hyperdense vessel or unexpected calcification. Skull: Normal.  Negative for fracture or focal lesion. Sinuses/Orbits: No acute finding. Other: None. CT CERVICAL SPINE FINDINGS Alignment: Degenerative grade 1 anterolisthesis at C4-C5 and C6-C7 and C7-T1. Skull base and vertebrae: No acute fracture. No primary bone lesion or focal pathologic process. Soft tissues and spinal canal: No prevertebral fluid or swelling. No visible canal hematoma. Disc levels: There is mild to moderate multilevel degenerative disc disease worst at C5-C6. There is moderate-severe multilevel facet arthropathy bilaterally. C1-C2 degenerative changes. Upper chest: There is interlobular septal thickening and ground-glass opacities in the lung apices suggesting pulmonary edema. Other: None. IMPRESSION: No acute intracranial abnormality. Stable mild sequela of chronic small vessel ischemic disease. No acute cervical spine fracture. Multilevel degenerative disc disease and facet arthropathy. These results were called by telephone at the time of interpretation on 21-Mar-2022 at 10:13 am to provider Dr. Okey Dupre, who verbally acknowledged these results. Electronically Signed   By: Caprice Renshaw M.D.   On: 03/21/2022 10:22   CT Cervical Spine Wo Contrast  Result Date: March 21, 2022 CLINICAL DATA:  Head trauma, minor (Age >= 65y); Neck trauma (Age >= 65y) EXAM: CT HEAD WITHOUT CONTRAST CT CERVICAL SPINE WITHOUT CONTRAST TECHNIQUE: Multidetector CT imaging of the head and cervical spine was performed following the standard protocol without intravenous contrast. Multiplanar CT image reconstructions of the cervical spine were also generated. RADIATION DOSE REDUCTION: This exam was performed according to the departmental dose-optimization program which includes automated exposure control, adjustment of the mA and/or kV according to patient size and/or use of iterative reconstruction technique. COMPARISON:  Head CT 05/08/2021. FINDINGS: CT HEAD FINDINGS Brain: No evidence of acute intracranial hemorrhage or extra-axial  collection.No evidence of mass lesion/concerning mass effect.The ventricles are normal in size.Scattered subcortical and periventricular white matter hypodensities, nonspecific but likely sequela of chronic small vessel ischemic disease.Mild cerebral atrophy Vascular: No hyperdense vessel or unexpected calcification. Skull: Normal. Negative for fracture or focal lesion. Sinuses/Orbits: No acute finding. Other: None. CT CERVICAL SPINE FINDINGS Alignment: Degenerative grade 1 anterolisthesis at C4-C5 and C6-C7 and C7-T1. Skull base and vertebrae: No acute fracture. No primary bone lesion or focal pathologic process. Soft tissues and spinal canal: No  prevertebral fluid or swelling. No visible canal hematoma. Disc levels: There is mild to moderate multilevel degenerative disc disease worst at C5-C6. There is moderate-severe multilevel facet arthropathy bilaterally. C1-C2 degenerative changes. Upper chest: There is interlobular septal thickening and ground-glass opacities in the lung apices suggesting pulmonary edema. Other: None. IMPRESSION: No acute intracranial abnormality. Stable mild sequela of chronic small vessel ischemic disease. No acute cervical spine fracture. Multilevel degenerative disc disease and facet arthropathy. These results were called by telephone at the time of interpretation on 02/01/2022 at 10:13 am to provider Dr. Okey Dupre, who verbally acknowledged these results. Electronically Signed   By: Caprice Renshaw M.D.   On: 02/05/2022 10:22    Cardiac Studies   TTE 02/21/2022  1. Left ventricular ejection fraction, by estimation, is 25 to 30%. The  left ventricle has severely decreased function. The left ventricle  demonstrates regional wall motion abnormalities (see scoring  diagram/findings for description). Left ventricular  diastolic parameters are consistent with Grade I diastolic dysfunction  (impaired relaxation). There is severe hypokinesis of the left  ventricular, entire anteroseptal wall,  anterior wall, anterolateral wall  and apical segment.   2. There is hypokinesis of the right ventricular apex with otherwise  preserved RV contraction. The right ventricular size is normal. There is  mildly elevated pulmonary artery systolic pressure.   3. The mitral valve is degenerative. Mild mitral valve regurgitation. No  evidence of mitral stenosis. Moderate mitral annular calcification.   4. Tricuspid valve regurgitation is mild to moderate.   5. The aortic valve is tricuspid. Aortic valve regurgitation is mild. No  aortic stenosis is present.   6. The inferior vena cava is normal in size with <50% respiratory  variability, suggesting right atrial pressure of 8 mmHg.   02/19/2022 Conclusions: Severe single-vessel coronary artery disease with thrombotic occlusion of the ostial/proximal LAD followed by sequential 30% mid and 70% mid/distal LAD stenoses.  30% ostial stenosis is also observed in OM1 branch of dominant LCx. Severely reduced LVEF with anterior hypokinesis/akinesis.  LVEF 25-30% with moderately elevated filling pressure (LVEDP 32 mmHg). Successful PCI to ostial/proximal LAD using Onyx Frontier 2.5 x 15 mm drug-eluting stent (postdilated to 2.8 mm) with 0% residual stenosis and TIMI-2 flow in the distal vessel, likely related to microvascular dysfunction in the setting of late presenting STEMI.    Patient Profile     88 y.o. female presenting with chest pain, diagnosed with new onset atrial fibrillation and anterior lateral STEMI.  Assessment & Plan    Anterolateral STEMI - s/p DES to pLAD on 10/24 - patient denies chest pain - DAPT with ASA and Brilinta, plan with Eliquis and plavix at discharge - continue Lipitor and Toprol - ambulate patient today  HFrEF ICM - LVEF 25-30%, new this admission - continue Toprol 12.5mg  BID>will decrease due to hypotension - BP low, limiting GDMT - IV lasix  daily  Paroxysmal Afib, new onset - s/p emergent cardioversion in  the ER 10/24 - brief afib on 10/15 and tarted on IV amiodarone - remains in NSR, still appears to have SVT - IV heparin - Toprol 12.5mg  BID - Plan to start Eliquis at discharge - consolidate amiodarone to PO  Frequent ectopy - IV amiodarone - Toprol 12.5mg  daily  For questions or updates, please contact [facility] Please consult [email or web address] for contact info under        Signed, Yevonne Yokum David Stall, PA-C  02/26/2022, 7:43 AM

## 2022-02-26 NOTE — Progress Notes (Signed)
ANTICOAGULATION CONSULT NOTE  Pharmacy Consult for heparin infusion Indication: atrial fibrillation  No Known Allergies  Patient Measurements: Height: 5\' 2"  (157.5 cm) Weight: 63.4 kg (139 lb 12.4 oz) IBW/kg (Calculated) : 50.1 Heparin Dosing Weight: 62.9 kg   Vital Signs: Temp: 98.9 F (37.2 C) (10/26 0200) Temp Source: Oral (10/26 0200) BP: 86/65 (10/26 0500) Pulse Rate: 82 (10/26 0500)  Labs: Recent Labs    02/19/2022 0917 02/18/2022 0918 02/21/2022 1250 02/25/22 0343 02/25/22 1410 02/26/22 0008 02/26/22 0440  HGB 16.4*  --   --  16.5*  --   --  14.9  HCT 49.6*  --   --  48.4*  --   --  44.5  PLT 238  --   --  207  --   --  183  HEPARINUNFRC  --   --   --  0.93* 0.78* 0.46  --   CREATININE 1.02*  --   --  1.14*  --   --  1.21*  TROPONINIHS  --  11,143* >24,000*  --   --   --   --      Estimated Creatinine Clearance: 26.5 mL/min (A) (by C-G formula based on SCr of 1.21 mg/dL (H)).   Medical History: Past Medical History:  Diagnosis Date   Arthritis    Chicken pox    Degenerative arthritis of right knee 07/2020   Osteoporosis    Vertigo    x1 over 2 yrs    Assessment: 88 y.o. female with medical history significant for osteoporosis, vertigo and arthritis who was brought into the ER by EMS for evaluation of chest pain and a fall. Pharmacy is asked to start and monitor IV heparin. Patient is on no chronic anticoagulation prior to arrival  Goal of Therapy:  Heparin level 0.3-0.7 units/ml Monitor platelets by anticoagulation protocol: Yes   Plan: 10/26@0008 : HL 0.36, therapeutic x 2 Continue heparin infusion at 750 units/hr Check  anti-Xa level once daily, next 10/27 am Continue to monitor H&H and platelets (stable currently)  Dallie Piles 02/26/2022,7:36 AM

## 2022-02-26 NOTE — Progress Notes (Signed)
ANTICOAGULATION CONSULT NOTE  Pharmacy Consult for heparin infusion Indication: atrial fibrillation  No Known Allergies  Patient Measurements: Height: 5\' 2"  (157.5 cm) Weight: 63.4 kg (139 lb 12.4 oz) IBW/kg (Calculated) : 50.1 Heparin Dosing Weight: 62.9 kg   Vital Signs: Temp: 99.4 F (37.4 C) (10/25 1950) Temp Source: Oral (10/25 1950) BP: 91/64 (10/26 0000) Pulse Rate: 79 (10/26 0000)  Labs: Recent Labs    08-Mar-2022 0917 03/08/2022 0918 03-08-2022 1250 02/25/22 0343 02/25/22 1410 02/26/22 0008  HGB 16.4*  --   --  16.5*  --   --   HCT 49.6*  --   --  48.4*  --   --   PLT 238  --   --  207  --   --   HEPARINUNFRC  --   --   --  0.93* 0.78* 0.46  CREATININE 1.02*  --   --  1.14*  --   --   TROPONINIHS  --  11,143* >24,000*  --   --   --      Estimated Creatinine Clearance: 28.1 mL/min (A) (by C-G formula based on SCr of 1.14 mg/dL (H)).   Medical History: Past Medical History:  Diagnosis Date   Arthritis    Chicken pox    Degenerative arthritis of right knee 07/2020   Osteoporosis    Vertigo    x1 over 2 yrs    Assessment: 88 y.o. female with medical history significant for osteoporosis, vertigo and arthritis who was brought into the ER by EMS for evaluation of chest pain and a fall. Pharmacy is asked to start and monitor IV heparin. Patient is on no chronic anticoagulation prior to arrival  Goal of Therapy:  Heparin level 0.3-0.7 units/ml Monitor platelets by anticoagulation protocol: Yes   Plan: 10/26@0008 : HL 0.46, therapeutic x 1 Continue heparin infusion at 750 units/hr Check confirmatory anti-Xa level in 8 hours Continue to monitor H&H and platelets (stable currently)  Allan Minotti A Dex Blakely 02/26/2022,1:04 AM

## 2022-02-26 NOTE — Progress Notes (Signed)
PROGRESS NOTE    Dawn Clark  VWU:[id number] DOB: [birth year] DOA: 02/15/2022  PCP: Albina Billet, MD    Brief Narrative:  This 88 years old female with PMH significant for osteoporosis, vertigo, arthritis presented in the ED with complaint of chest pain and s/p fall.  Patient describes chest pain for last 2 days mostly in both breasts and denies having any associated symptoms except shortness of breath.  Pain was persistent and nonradiating.  She has called her neighbors to take her to the urgent care for the evaluation while walking towards her car patient lost her balance and fell backwards hitting her head.  Her friend reported she has lost consciousness for few minutes.  But patient does not remember.  Patient is brought in the ED.  Twelve-lead EKG shows ST elevation in the anterior leads.  Patient was emergently taken to the left heart cath, she underwent successful PCI intervention.  Assessment & Plan:   Principal Problem:   ST elevation myocardial infarction (STEMI) ([place]) Active Problems:   Acute systolic CHF (congestive heart failure) ([facility])   Atrial fibrillation with RVR ([facility])   NSVT (nonsustained ventricular tachycardia) ([place])   Ischemic cardiomyopathy  STEMI involving LAD: Patient presented for the evaluation of chest pain and was noted to have EKG changes concerning for anterior lateral STEMI, She was emergently taken to the left heart cath and found to have occluded ostial/proximal LAD.  Patient successfully underwent PCI with stent angioplasty. Cardiology recommended continue aspirin and Brilinta, metoprolol and high intensity statins. Plan: Discharge on Eliquis and aspirin.  Atrial fibrillation, new onset: She presented to the ED for evaluation of chest pain and was found to have new onset rapid A-fib. She is s/p cardioversion and currently in sinus rhythm.  She is maintaining  sinus rhythm. Continue heparin infusion, Toprol-XL 12.5 mg twice daily. Plan to start  Eliquis upon discharge. She has several episodes of paroxysmal A-fib, frequent runs of nonsustained VT throughout the day.  S he is started on amiodarone infusion for next 24 hours.  Acute systolic CHF/ Ischemic cardiomyopathy: Patient presented for the evaluation of chest pain and found to have NSTEMI and A-fib, complicated by acute CHF due to her reduced LVEF. Continue IV Lasix 20mg  twice daily.   Continue metoprolol 12.5 MG BID.   DVT prophylaxis: Heparin gtt. Code Status: Full code Family Communication: No family at bedside Disposition Plan:   Status is: Inpatient Remains inpatient appropriate because: Admitted for NSTEMI underwent left heart cath found to have proximal LAD obstruction underwent stent angioplasty.  Tolerated well   Consultants:  Cardiology  Procedures: Stent angioplasty, s/p ablation Antimicrobials: None  Subjective: Patient was seen and examined at bedside.  Overnight events noted.   Patient reports feeling much better.  She is able to tolerate regular diet. She denies any chest pain, shortness of breath.  She has not got out of bed.   Objective: Vitals:   02/26/22 0700 02/26/22 0800 02/26/22 1000 02/26/22 1100  BP: (!) 85/53 92/70 112/78 96/70  Pulse: 79 83  84  Resp: 20 (!) 31 (!) 24 (!) 24  Temp:  99 F (37.2 C)    TempSrc:  Oral    SpO2: 100% 100% 100% 100%  Weight:      Height:        Intake/Output Summary (Last 24 hours) at 02/26/2022 1350 Last data filed at 02/26/2022 1100 Gross per 24 hour  Intake 780.59 ml  Output 900 ml  Net -119.41 ml  Filed Weights   02/28/2022 0911 02/14/2022 1500  Weight: 63.4 kg 63.4 kg    Examination:  General exam: Appears comfortable, not in any acute distress, deconditioned. Respiratory system: CTA bilaterally, respiratory effort normal, RR 15 Cardiovascular system: S1 & S2 heard, regular rate and rhythm, no murmur. Gastrointestinal system: Abdomen is soft, non tender, non distended, BS+ Central  nervous system: Alert and oriented X 3. No focal neurological deficits. Extremities: No edema, no cyanosis, no clubbing Skin: No rashes, lesions or ulcers Psychiatry: Judgement and insight appear normal. Mood & affect appropriate.     Data Reviewed: I have personally reviewed following labs and imaging studies  CBC: Recent Labs  Lab 02/20/2022 0917 02/25/22 0343 02/26/22 0440  WBC 14.1* 23.0* 19.8*  NEUTROABS 10.9*  --   --   HGB 16.4* 16.5* 14.9  HCT 49.6* 48.4* 44.5  MCV 96.7 91.0 93.3  PLT 238 207 183   Basic Metabolic Panel: Recent Labs  Lab 02/28/2022 0917 02/25/22 0343 02/25/22 1232 02/26/22 0440  NA 141 141  --  140  K 3.7 3.3* 3.7 3.7  CL 108 103  --  104  CO2 24 26  --  25  GLUCOSE 162* 125*  --  114*  BUN 20 24*  --  35*  CREATININE 1.02* 1.14*  --  1.21*  CALCIUM 8.6* 8.6*  --  8.2*  MG 2.0  --  2.0 2.3   GFR: Estimated Creatinine Clearance: 26.5 mL/min (A) (by C-G formula based on SCr of 1.21 mg/dL (H)). Liver Function Tests: Recent Labs  Lab 02/28/2022 0917  AST 151*  ALT 35  ALKPHOS 45  BILITOT 0.7  PROT 6.7  ALBUMIN 3.7   No results for input(s): "LIPASE", "AMYLASE" in the last 168 hours. No results for input(s): "AMMONIA" in the last 168 hours. Coagulation Profile: No results for input(s): "INR", "PROTIME" in the last 168 hours. Cardiac Enzymes: No results for input(s): "CKTOTAL", "CKMB", "CKMBINDEX", "TROPONINI" in the last 168 hours. BNP (last 3 results) No results for input(s): "PROBNP" in the last 8760 hours. HbA1C: No results for input(s): "HGBA1C" in the last 72 hours. CBG: Recent Labs  Lab 02/12/2022 0937 02/15/2022 1143  GLUCAP 142* 128*   Lipid Profile: No results for input(s): "CHOL", "HDL", "LDLCALC", "TRIG", "CHOLHDL", "LDLDIRECT" in the last 72 hours. Thyroid Function Tests: Recent Labs    02/10/2022 0917 02/26/22 0440  TSH 5.113*  --   FREET4  --  1.41*   Anemia Panel: No results for input(s): "VITAMINB12", "FOLATE",  "FERRITIN", "TIBC", "IRON", "RETICCTPCT" in the last 72 hours. Sepsis Labs: No results for input(s): "PROCALCITON", "LATICACIDVEN" in the last 168 hours.  Recent Results (from the past 240 hour(s))  MRSA Next Gen by PCR, Nasal     Status: None   Collection Time: 02/25/2022 11:48 AM   Specimen: Nasal Mucosa; Nasal Swab  Result Value Ref Range Status   MRSA by PCR Next Gen NOT DETECTED NOT DETECTED Final    Comment: (NOTE) The GeneXpert MRSA Assay (FDA approved for NASAL specimens only), is one component of a comprehensive MRSA colonization surveillance program. It is not intended to diagnose MRSA infection nor to guide or monitor treatment for MRSA infections. Test performance is not FDA approved in patients less than 60 years old. Performed at [facility], [address] [place], [place] [address]     Radiology Studies: ECHOCARDIOGRAM LIMITED  Result Date: 02/25/2022    ECHOCARDIOGRAM LIMITED REPORT   Patient Name:   Adeli  LEA Mccauslin Date of Exam: 02/25/2022 Medical Rec #:  [id number]      Height:       62.0 in Accession #:    [id number]     Weight:       139.8 lb Date of Birth:  01-03-1929      BSA:          1.642 m Patient Age:    89 years       BP:           90/67 mmHg Patient Gender: F              HR:           96 bpm. Exam Location:  [facility] Procedure: Limited Echo, Cardiac Doppler, Color Doppler and Intracardiac            Opacification Agent Indications:     Acute myocardial Infarction --unspecified I21.9  History:         Patient has prior history of Echocardiogram examinations, most                  recent March 02, 2022. Arrythmias:Atrial Fibrillation. STEMI.  Sonographer:     Cristela Blue Referring Phys:  [id number] CHRISTOPHER END Diagnosing Phys: Debbe Odea MD IMPRESSIONS  1. No LV thrombus noted. Left ventricular ejection fraction, by estimation, is 25 to 30%. The left ventricle has severely decreased function.  2. Right ventricular systolic function is normal.  3. Left atrial  size was severely dilated.  4. The mitral valve is normal in structure. Mild mitral valve regurgitation.  5. Tricuspid valve regurgitation is mild to moderate. FINDINGS  Left Ventricle: No LV thrombus noted. Left ventricular ejection fraction, by estimation, is 25 to 30%. The left ventricle has severely decreased function. Definity contrast agent was given IV to delineate the left ventricular endocardial borders. Right Ventricle: Right ventricular systolic function is normal. Left Atrium: Left atrial size was severely dilated. Right Atrium: Right atrial size was normal in size. Mitral Valve: The mitral valve is normal in structure. Mild mitral valve regurgitation. Tricuspid Valve: The tricuspid valve is normal in structure. Tricuspid valve regurgitation is mild to moderate. LEFT VENTRICLE PLAX 2D LVIDd:         4.30 cm LVIDs:         3.80 cm LV PW:         0.80 cm LV IVS:        0.60 cm LVOT diam:     1.90 cm LVOT Area:     2.84 cm  LV Volumes (MOD) LV vol d, MOD A2C: 31.2 ml LV vol d, MOD A4C: 56.4 ml LV vol s, MOD A2C: 16.4 ml LV vol s, MOD A4C: 45.4 ml LV SV MOD A2C:     14.8 ml LV SV MOD A4C:     56.4 ml LV SV MOD BP:      13.3 ml LEFT ATRIUM              Index        RIGHT ATRIUM          Index LA diam:        3.10 cm  1.89 cm/m   RA Area:     9.38 cm LA Vol (A2C):   129.0 ml 78.58 ml/m  RA Volume:   17.90 ml 10.90 ml/m LA Vol (A4C):   83.8 ml  51.05 ml/m LA Biplane Vol: 111.0 ml 67.61 ml/m   AORTA Ao Root diam: 2.60  cm TRICUSPID VALVE TR Peak grad:   29.8 mmHg TR Vmax:        273.00 cm/s  SHUNTS Systemic Diam: 1.90 cm Debbe Odea MD Electronically signed by Debbe Odea MD Signature Date/Time: 02/25/2022/1:11:14 PM    Final    ECHOCARDIOGRAM COMPLETE  Result Date: 02/25/2022    ECHOCARDIOGRAM REPORT   Patient Name:   Ledell Peoples Date of Exam: 02/25/2022 Medical Rec #:  [id number]      Height:       62.0 in Accession #:    [id number]     Weight:       139.7 lb Date of Birth:  1928/09/19       BSA:          1.641 m Patient Age:    89 years       BP:           102/70 mmHg Patient Gender: F              HR:           82 bpm. Exam Location:  [facility] Procedure: 2D Echo, Cardiac Doppler, Color Doppler and Intracardiac            Opacification Agent Indications:     Acute myocardial Infarction -unspecified I21.9  History:         Patient has no prior history of Echocardiogram examinations.                  CHF, Coronary stent 02/21/2022; Arrythmias:Atrial Fibrillation.                  No cardiac history listed in chart.  Sonographer:     Cristela Blue Sonographer#2:   Ceasar Mons Referring Phys:  [id number] CHRISTOPHER END Diagnosing Phys: Yvonne Kendall MD  Sonographer Comments: Image quality was fair. IMPRESSIONS  1. Left ventricular ejection fraction, by estimation, is 25 to 30%. The left ventricle has severely decreased function. The left ventricle demonstrates regional wall motion abnormalities (see scoring diagram/findings for description). Left ventricular diastolic parameters are consistent with Grade I diastolic dysfunction (impaired relaxation). There is severe hypokinesis of the left ventricular, entire anteroseptal wall, anterior wall, anterolateral wall and apical segment.  2. There is hypokinesis of the right ventricular apex with otherwise preserved RV contraction. The right ventricular size is normal. There is mildly elevated pulmonary artery systolic pressure.  3. The mitral valve is degenerative. Mild mitral valve regurgitation. No evidence of mitral stenosis. Moderate mitral annular calcification.  4. Tricuspid valve regurgitation is mild to moderate.  5. The aortic valve is tricuspid. Aortic valve regurgitation is mild. No aortic stenosis is present.  6. The inferior vena cava is normal in size with <50% respiratory variability, suggesting right atrial pressure of 8 mmHg. FINDINGS  Left Ventricle: There appears to be a prominent trabecular or false tendon near the LV apex; no definite thrombus  is identified. Left ventricular ejection fraction, by estimation, is 25 to 30%. The left ventricle has severely decreased function. The left ventricle demonstrates regional wall motion abnormalities. Severe hypokinesis of the left ventricular, entire anteroseptal wall, anterior wall, anterolateral wall and apical segment. Definity contrast agent was given IV to delineate the left ventricular endocardial borders. The left ventricular internal cavity size was normal in size. There is no left ventricular hypertrophy. Left ventricular diastolic parameters are consistent with Grade I diastolic dysfunction (impaired relaxation). Right Ventricle: There is hypokinesis of the right ventricular apex with otherwise preserved RV contraction.  The right ventricular size is normal. No increase in right ventricular wall thickness. There is mildly elevated pulmonary artery systolic pressure. The tricuspid regurgitant velocity is 2.87 m/s, and with an assumed right atrial pressure of 8 mmHg, the estimated right ventricular systolic pressure is 40.9 mmHg. Left Atrium: Left atrial size was normal in size. Right Atrium: Right atrial size was normal in size. Pericardium: There is no evidence of pericardial effusion. Presence of epicardial fat layer. Mitral Valve: The mitral valve is degenerative in appearance. There is mild thickening of the mitral valve leaflet(s). Moderate mitral annular calcification. Mild mitral valve regurgitation. No evidence of mitral valve stenosis. Tricuspid Valve: The tricuspid valve is not well visualized. Tricuspid valve regurgitation is mild to moderate. Aortic Valve: The aortic valve is tricuspid. Aortic valve regurgitation is mild. No aortic stenosis is present. Aortic valve mean gradient measures 2.0 mmHg. Aortic valve peak gradient measures 3.0 mmHg. Aortic valve area, by VTI measures 2.37 cm. Pulmonic Valve: The pulmonic valve was not well visualized. Pulmonic valve regurgitation is trivial. No  evidence of pulmonic stenosis. Aorta: The aortic root is normal in size and structure. Venous: The inferior vena cava is normal in size with less than 50% respiratory variability, suggesting right atrial pressure of 8 mmHg. IAS/Shunts: No atrial level shunt detected by color flow Doppler.  LEFT VENTRICLE PLAX 2D LVIDd:         4.20 cm     Diastology LVIDs:         3.80 cm     LV e' medial:    5.22 cm/s LV PW:         0.90 cm     LV E/e' medial:  10.1 LV IVS:        0.80 cm     LV e' lateral:   4.57 cm/s LVOT diam:     2.00 cm     LV E/e' lateral: 11.6 LV SV:         37 LV SV Index:   23 LVOT Area:     3.14 cm  LV Volumes (MOD) LV vol d, MOD A2C: 59.5 ml LV vol d, MOD A4C: 61.9 ml LV vol s, MOD A2C: 49.1 ml LV vol s, MOD A4C: 40.1 ml LV SV MOD A2C:     10.4 ml LV SV MOD A4C:     61.9 ml LV SV MOD BP:      18.6 ml RIGHT VENTRICLE RV Basal diam:  2.60 cm RV Mid diam:    1.70 cm RV S prime:     14.70 cm/s TAPSE (M-mode): 2.5 cm LEFT ATRIUM             Index        RIGHT ATRIUM          Index LA diam:        2.70 cm 1.65 cm/m   RA Area:     6.82 cm LA Vol (A2C):   31.8 ml 19.37 ml/m  RA Volume:   11.80 ml 7.19 ml/m LA Vol (A4C):   28.4 ml 17.30 ml/m LA Biplane Vol: 31.5 ml 19.19 ml/m  AORTIC VALVE AV Area (Vmax):    2.58 cm AV Area (Vmean):   2.33 cm AV Area (VTI):     2.37 cm AV Vmax:           86.50 cm/s AV Vmean:          63.200 cm/s AV VTI:  0.158 m AV Peak Grad:      3.0 mmHg AV Mean Grad:      2.0 mmHg LVOT Vmax:         71.10 cm/s LVOT Vmean:        46.900 cm/s LVOT VTI:          0.119 m LVOT/AV VTI ratio: 0.75  AORTA Ao Root diam: 3.10 cm MITRAL VALVE               TRICUSPID VALVE MV Area (PHT): 6.02 cm    TR Peak grad:   32.9 mmHg MV Decel Time: 126 msec    TR Vmax:        287.00 cm/s MV E velocity: 52.90 cm/s MV A velocity: 86.50 cm/s  SHUNTS MV E/A ratio:  0.61        Systemic VTI:  0.12 m                            Systemic Diam: 2.00 cm Yvonne Kendall MD Electronically signed by  Yvonne Kendall MD Signature Date/Time: 02/02/2022/3:38:58 PM    Final (Updated)     Scheduled Meds:  aspirin  81 mg Oral Daily   atorvastatin  80 mg Oral Daily   Chlorhexidine Gluconate Cloth  6 each Topical Daily   furosemide  20 mg Intravenous Daily   metoprolol succinate  12.5 mg Oral Daily   sodium chloride flush  3 mL Intravenous Q12H   ticagrelor  90 mg Oral BID   Continuous Infusions:  sodium chloride     amiodarone 60 mg/hr (02/26/22 1103)   heparin 750 Units/hr (02/26/22 1100)     LOS: 2 days   Time spent: 35 mins   Frederika Hukill, MD [facility]   If 7PM-7AM, please contact night-coverage

## 2022-02-26 NOTE — Progress Notes (Signed)
[facility] for Electrolyte Monitoring and Replacement   Recent Labs: Potassium (mmol/L)  Date Value  02/26/2022 3.7   Magnesium (mg/dL)  Date Value  02/26/2022 2.3   Calcium (mg/dL)  Date Value  02/26/2022 8.2 (L)   Albumin (g/dL)  Date Value  02/05/2022 3.7   Sodium (mmol/L)  Date Value  02/26/2022 140    Assessment: 89YOF w/ PMH of OA, vertigo presenting with chest pain s/p STEMI / emergent DCCV, new-onset atrial fibrillation followed by cardiology. Pharmacy is asked to follow and replace electrolytes while in CCU.  Diuretics: furosemide 20 mg IV once daily  Goal of Therapy:  Potassium 4.0 - 5.1 mmol/L Magnesium 2.0 - 2.4 mg/dL All Other Electrolytes WNL  Plan:  Repeat 40 mEq oral KCl x 1 Recheck electrolytes in am  Dallie Piles ,PharmD Clinical Pharmacist 02/26/2022 7:36 AM

## 2022-02-27 DIAGNOSIS — I5021 Acute systolic (congestive) heart failure: Secondary | ICD-10-CM | POA: Diagnosis not present

## 2022-02-27 DIAGNOSIS — R531 Weakness: Secondary | ICD-10-CM

## 2022-02-27 DIAGNOSIS — I2102 ST elevation (STEMI) myocardial infarction involving left anterior descending coronary artery: Secondary | ICD-10-CM | POA: Diagnosis not present

## 2022-02-27 DIAGNOSIS — I4729 Other ventricular tachycardia: Secondary | ICD-10-CM | POA: Diagnosis not present

## 2022-02-27 DIAGNOSIS — I4891 Unspecified atrial fibrillation: Secondary | ICD-10-CM | POA: Diagnosis not present

## 2022-02-27 DIAGNOSIS — I255 Ischemic cardiomyopathy: Secondary | ICD-10-CM | POA: Diagnosis not present

## 2022-02-27 LAB — BASIC METABOLIC PANEL
Anion gap: 11 (ref 5–15)
BUN: 36 mg/dL — ABNORMAL HIGH (ref 8–23)
CO2: 23 mmol/L (ref 22–32)
Calcium: 7.9 mg/dL — ABNORMAL LOW (ref 8.9–10.3)
Chloride: 102 mmol/L (ref 98–111)
Creatinine, Ser: 1.18 mg/dL — ABNORMAL HIGH (ref 0.44–1.00)
GFR, Estimated: 44 mL/min — ABNORMAL LOW (ref >60)
Glucose, Bld: 126 mg/dL — ABNORMAL HIGH (ref 70–99)
Potassium: 3.7 mmol/L (ref 3.5–5.1)
Sodium: 136 mmol/L (ref 135–145)

## 2022-02-27 LAB — CBC
HCT: 43.3 % (ref 36.0–46.0)
Hemoglobin: 14.4 g/dL (ref 12.0–15.0)
MCH: 31.2 pg (ref 26.0–34.0)
MCHC: 33.3 g/dL (ref 30.0–36.0)
MCV: 93.9 fL (ref 80.0–100.0)
Platelets: 193 10*3/uL (ref 150–400)
RBC: 4.61 MIL/uL (ref 3.87–5.11)
RDW: 13 % (ref 11.5–15.5)
WBC: 19.7 10*3/uL — ABNORMAL HIGH (ref 4.0–10.5)
nRBC: 0 % (ref 0.0–0.2)

## 2022-02-27 LAB — HEPARIN LEVEL (UNFRACTIONATED)
Heparin Unfractionated: 0.18 IU/mL — ABNORMAL LOW (ref 0.30–0.70)
Heparin Unfractionated: 0.58 IU/mL (ref 0.30–0.70)

## 2022-02-27 LAB — MAGNESIUM: Magnesium: 2.2 mg/dL (ref 1.7–2.4)

## 2022-02-27 MED ORDER — LEVOTHYROXINE SODIUM 50 MCG PO TABS
25.0000 ug | ORAL_TABLET | Freq: Every day | ORAL | Status: DC
  Administered 2022-02-27 – 2022-03-01 (×3): 25 ug via ORAL
  Filled 2022-02-27 (×3): qty 1

## 2022-02-27 MED ORDER — METHOCARBAMOL 500 MG PO TABS
500.0000 mg | ORAL_TABLET | Freq: Once | ORAL | Status: AC
  Administered 2022-02-28: 500 mg via ORAL
  Filled 2022-02-27: qty 1

## 2022-02-27 MED ORDER — HEPARIN BOLUS VIA INFUSION
1900.0000 [IU] | Freq: Once | INTRAVENOUS | Status: AC
  Administered 2022-02-27: 1900 [IU] via INTRAVENOUS
  Filled 2022-02-27: qty 1900

## 2022-02-27 MED ORDER — POTASSIUM CHLORIDE 20 MEQ PO PACK
40.0000 meq | PACK | Freq: Once | ORAL | Status: AC
  Administered 2022-02-27: 40 meq via ORAL
  Filled 2022-02-27: qty 2

## 2022-02-27 MED ORDER — ACETAMINOPHEN 325 MG PO TABS
650.0000 mg | ORAL_TABLET | ORAL | Status: DC | PRN
  Administered 2022-02-28 – 2022-03-01 (×2): 650 mg via ORAL
  Filled 2022-02-27 (×2): qty 2

## 2022-02-27 MED ORDER — AMIODARONE HCL 200 MG PO TABS
400.0000 mg | ORAL_TABLET | Freq: Two times a day (BID) | ORAL | Status: DC
  Administered 2022-02-27 – 2022-02-28 (×3): 400 mg via ORAL
  Filled 2022-02-27 (×3): qty 2

## 2022-02-27 NOTE — Progress Notes (Signed)
Rounding Note    Patient Name: Dawn Clark Date of Encounter: 02/27/2022  [facility] Cardiologist: [facility]  Subjective   No chest pain or shortness of breath Reports feeling very weak, declining to get out of bed to the chair Reports that she lives alone, family lives nearby Telemetry reviewed, no significant arrhythmia Remains on amiodarone infusion, ran at 1 mg/min past 24 hours given significant arrhythmia  Inpatient Medications    Scheduled Meds:  amiodarone  400 mg Oral BID   aspirin  81 mg Oral Daily   atorvastatin  80 mg Oral Daily   Chlorhexidine Gluconate Cloth  6 each Topical Daily   furosemide  20 mg Intravenous Daily   levothyroxine  25 mcg Oral Q0600   metoprolol succinate  12.5 mg Oral Daily   sodium chloride flush  3 mL Intravenous Q12H   ticagrelor  90 mg Oral BID   Continuous Infusions:  sodium chloride     heparin 950 Units/hr (02/27/22 1100)   PRN Meds: sodium chloride, acetaminophen, ondansetron (ZOFRAN) IV, mouth rinse, sodium chloride flush   Vital Signs    Vitals:   02/27/22 0905 02/27/22 1000 02/27/22 1100 02/27/22 1147  BP:  (!) 82/57 (!) 95/53   Pulse: 79 68  74  Resp: 17 (!) 22 19 12   Temp:      TempSrc:      SpO2: 100% 95% 96% 96%  Weight:      Height:        Intake/Output Summary (Last 24 hours) at 02/27/2022 1234 Last data filed at 02/27/2022 1100 Gross per 24 hour  Intake 1292.84 ml  Output 1000 ml  Net 292.84 ml      03/08/2022    3:00 PM 03/08/2022    9:11 AM 08/28/2020   10:28 AM  Last 3 Weights  Weight (lbs) 139 lb 12.4 oz 139 lb 11.2 oz 143 lb  Weight (kg) 63.4 kg 63.368 kg 64.864 kg      Telemetry    Normal sinus rhythm- Personally Reviewed  ECG     - Personally Reviewed  Physical Exam   GEN: No acute distress.  Frail, weak Neck: No JVD Cardiac: RRR, no murmurs, rubs, or gallops.  Respiratory: Clear to auscultation bilaterally. GI: Soft, nontender, non-distended  MS: No edema;  No deformity. Neuro:  Nonfocal  Psych: Normal affect   Labs    High Sensitivity Troponin:   Recent Labs  Lab 2022/03/08 0918 Mar 08, 2022 1250  TROPONINIHS 11,143* >24,000*     Chemistry Recent Labs  Lab 03/08/2022 0917 02/25/22 0343 02/25/22 1232 02/26/22 0440 02/27/22 0528  NA 141 141  --  140 136  K 3.7 3.3* 3.7 3.7 3.7  CL 108 103  --  104 102  CO2 24 26  --  25 23  GLUCOSE 162* 125*  --  114* 126*  BUN 20 24*  --  35* 36*  CREATININE 1.02* 1.14*  --  1.21* 1.18*  CALCIUM 8.6* 8.6*  --  8.2* 7.9*  MG 2.0  --  2.0 2.3 2.2  PROT 6.7  --   --   --   --   ALBUMIN 3.7  --   --   --   --   AST 151*  --   --   --   --   ALT 35  --   --   --   --   ALKPHOS 45  --   --   --   --  BILITOT 0.7  --   --   --   --   GFRNONAA 52* 45*  --  42* 44*  ANIONGAP 9 12  --  11 11    Lipids No results for input(s): "CHOL", "TRIG", "HDL", "LABVLDL", "LDLCALC", "CHOLHDL" in the last 168 hours.  Hematology Recent Labs  Lab 02/25/22 0343 02/26/22 0440 02/27/22 0528  WBC 23.0* 19.8* 19.7*  RBC 5.32* 4.77 4.61  HGB 16.5* 14.9 14.4  HCT 48.4* 44.5 43.3  MCV 91.0 93.3 93.9  MCH 31.0 31.2 31.2  MCHC 34.1 33.5 33.3  RDW 12.5 12.9 13.0  PLT 207 183 193   Thyroid  Recent Labs  Lab 02/02/2022 0917 02/26/22 0440  TSH 5.113*  --   FREET4  --  1.41*    BNPNo results for input(s): "BNP", "PROBNP" in the last 168 hours.  DDimer No results for input(s): "DDIMER" in the last 168 hours.   Radiology    No results found.  Cardiac Studies   Echo Left ventricular ejection fraction, by estimation, is 25 to 30%. The  left ventricle has severely decreased function. The left ventricle  demonstrates regional wall motion abnormalities (see scoring  diagram/findings for description). Left ventricular  diastolic parameters are consistent with Grade I diastolic dysfunction  (impaired relaxation). There is severe hypokinesis of the left  ventricular, entire anteroseptal wall, anterior wall,  anterolateral wall  and apical segment.   2. There is hypokinesis of the right ventricular apex with otherwise  preserved RV contraction. The right ventricular size is normal. There is  mildly elevated pulmonary artery systolic pressure.   3. The mitral valve is degenerative. Mild mitral valve regurgitation. No  evidence of mitral stenosis. Moderate mitral annular calcification.   4. Tricuspid valve regurgitation is mild to moderate.   5. The aortic valve is tricuspid. Aortic valve regurgitation is mild. No  aortic stenosis is present.   6. The inferior vena cava is normal in size with <50% respiratory   Cardiac catheterization Severe single-vessel coronary artery disease with thrombotic occlusion of the ostial/proximal LAD followed by sequential 30% mid and 70% mid/distal LAD stenoses.  30% ostial stenosis is also observed in OM1 branch of dominant LCx. Severely reduced LVEF with anterior hypokinesis/akinesis.  LVEF 25-30% with moderately elevated filling pressure (LVEDP 32 mmHg). Successful PCI to ostial/proximal LAD using Onyx Frontier 2.5 x 15 mm drug-eluting stent (postdilated to 2.8 mm) with 0% residual stenosis and TIMI-2 flow in the distal vessel, likely related to microvascular dysfunction in the setting of late presenting STEMI.   Patient Profile     88 y.o. female presenting with chest pain, diagnosed with new onset atrial fibrillation and anterior lateral STEMI.  Assessment & Plan   Anterolateral STEMI Taken to catheterization lab, stent placed to her proximal LAD October 24 Echocardiogram showing large region anterior and anteroseptal wall hypokinesis wrapping around the apical region with moderate to severely depressed ejection fraction 25 to 30% -On Brilinta, aspirin, heparin infusion in the setting of atrial fibrillation. Plan for Eliquis and Plavix at discharge. Continue low-dose metoprolol succinate 12.5 daily with Lipitor Unable to escalate goal-directed medical  therapy secondary to hypotension    paroxysmal atrial fibrillation/nonsustained VT  emergent cardioversion in the emergency room October 24  Until yesterday she continued to have episodes of atrial fibrillation, concern for short runs of nonsustained VT -Amiodarone infusion 1 mg/min past 24 hours, no significant arrhythmia noted in last 24 hours. -We will hold the amiodarone infusion and transition to amiodarone  400 twice daily -Plan to continue amiodarone load for 5 more days 400 twice daily then down to 200 twice daily Continue metoprolol succinate 12.5 daily, unable to titrate upward secondary to low blood pressure -Plan to transition to Eliquis at the time of discharge   Ischemic cardiomyopathy Late presenting STEMI Ejection fraction estimated 25% Unable to titrate medications On Brilinta  Weakness Will likely need placement in skilled nursing facility, unable to get out of bed and sit in a chair    Total encounter time more than 50 minutes  Greater than 50% was spent in counseling and coordination of care with the patient   For questions or updates, please contact [facility] Please consult [email or web address] for contact info under        Signed, Julien Nordmann, MD  02/27/2022, 12:34 PM

## 2022-02-27 NOTE — Progress Notes (Signed)
ANTICOAGULATION CONSULT NOTE  Pharmacy Consult for heparin infusion Indication: atrial fibrillation  No Known Allergies  Patient Measurements: Height: 5\' 2"  (157.5 cm) Weight: 63.4 kg (139 lb 12.4 oz) IBW/kg (Calculated) : 50.1 Heparin Dosing Weight: 62.9 kg   Vital Signs: Temp: 98.4 F (36.9 C) (10/27 0400) Temp Source: Oral (10/27 0400) BP: 91/60 (10/27 0600) Pulse Rate: 77 (10/27 0600)  Labs: Recent Labs    Mar 07, 2022 0918 March 07, 2022 1250 02/25/22 0343 02/25/22 1410 02/26/22 0008 02/26/22 0440 02/26/22 0806 02/27/22 0528  HGB  --   --  16.5*  --   --  14.9  --  14.4  HCT  --   --  48.4*  --   --  44.5  --  43.3  PLT  --   --  207  --   --  183  --  193  HEPARINUNFRC  --   --  0.93*   < > 0.46  --  0.36 0.18*  CREATININE  --   --  1.14*  --   --  1.21*  --  1.18*  TROPONINIHS 11,143* >24,000*  --   --   --   --   --   --    < > = values in this interval not displayed.     Estimated Creatinine Clearance: 27.2 mL/min (A) (by C-G formula based on SCr of 1.18 mg/dL (H)).   Medical History: Past Medical History:  Diagnosis Date   Arthritis    Chicken pox    Degenerative arthritis of right knee 07/2020   Osteoporosis    Vertigo    x1 over 2 yrs    Assessment: 88 y.o. female with medical history significant for osteoporosis, vertigo and arthritis who was brought into the ER by EMS for evaluation of chest pain and a fall. Pharmacy is asked to start and monitor IV heparin. Patient is on no chronic anticoagulation prior to arrival  Goal of Therapy:  Heparin level 0.3-0.7 units/ml Monitor platelets by anticoagulation protocol: Yes   Plan: 10/27@0528 : HL 0.18, subtherapeutic  Bolus 1900 units x 1 Continue heparin infusion at 950 units/hr Check anti-Xa level in 8 hours after rate change Continue to monitor H&H and platelets (stable currently)  Caroleann Casler A Lakasha Mcfall 02/27/2022,6:33 AM

## 2022-02-27 NOTE — Progress Notes (Signed)
[place] for Electrolyte Monitoring and Replacement   Recent Labs: Potassium (mmol/L)  Date Value  02/27/2022 3.7   Magnesium (mg/dL)  Date Value  02/27/2022 2.2   Calcium (mg/dL)  Date Value  02/27/2022 7.9 (L)   Albumin (g/dL)  Date Value  02/02/2022 3.7   Sodium (mmol/L)  Date Value  02/27/2022 136    Assessment: 89YOF w/ PMH of OA, vertigo presenting with chest pain s/p STEMI / emergent DCCV, new-onset atrial fibrillation followed by cardiology. Pharmacy is asked to follow and replace electrolytes while in CCU.  Diuretics: furosemide 20 mg IV once daily  Goal of Therapy:  Potassium 4.0 - 5.1 mmol/L Magnesium 2.0 - 2.4 mg/dL All Other Electrolytes WNL  Plan:  Repeat 40 mEq oral KCl x 1 Recheck electrolytes in am  Dallie Piles ,PharmD Clinical Pharmacist 02/27/2022 7:08 AM

## 2022-02-27 NOTE — Progress Notes (Signed)
PROGRESS NOTE    Dawn Clark  TWS:[id number] DOB: Nov 17, 1928 DOA: 03/12/2022  PCP: Jaclyn Shaggy, MD    Brief Narrative:  This 87 years old female with PMH significant for osteoporosis, vertigo, arthritis presented in the ED with complaint of chest pain and s/p fall.  Patient describes chest pain for last 2 days mostly in both breasts and denies having any associated symptoms except shortness of breath.  Pain was persistent and nonradiating.  She has called her neighbors to take her to the urgent care for the evaluation while walking towards her car patient lost her balance and fell backwards hitting her head.  Her friend reported she has lost consciousness for few minutes.  But patient does not remember.  Patient is brought in the ED.  Twelve-lead EKG shows ST elevation in the anterior leads. Patient was emergently taken to the left heart cath, she underwent successful PCI intervention.  Assessment & Plan:   Principal Problem:   ST elevation myocardial infarction (STEMI) (HCC) Active Problems:   Acute systolic CHF (congestive heart failure) (HCC)   Atrial fibrillation with RVR (HCC)   NSVT (nonsustained ventricular tachycardia) (HCC)   Ischemic cardiomyopathy   Weakness  STEMI involving LAD: Patient presented for the evaluation of chest pain and was noted to have EKG changes concerning for anterior lateral STEMI, She was emergently taken to the left heart cath and found to have occluded ostial/proximal LAD.  Patient successfully underwent PCI with stent angioplasty. Cardiology recommended continue aspirin and Brilinta, metoprolol and high intensity statins. Plan: Discharge on Eliquis and plavix.  Atrial fibrillation, new onset: She presented to the ED for evaluation of chest pain and was found to have new onset rapid A-fib. She is s/p cardioversion and currently in sinus rhythm.  She is maintaining  sinus rhythm. Continue heparin infusion, Toprol-XL 12.5 mg twice daily. Plan to  start Eliquis upon discharge. She has several episodes of paroxysmal A-fib, frequent runs of nonsustained VT throughout the day.   She is started on amiodarone infusion for next 24 hours,  Now she is converted to amiodarone to 400 mg twice daily then 200 mg twice daily.  Acute systolic CHF/ Ischemic cardiomyopathy: Patient presented for the evaluation of chest pain and found to have NSTEMI and A-fib, complicated by acute CHF due to her reduced LVEF. Echo shows LVEF less than 25% Continue IV Lasix 20mg  twice daily.   Unable to titrate medications.   DVT prophylaxis: Heparin gtt. Code Status: Full code Family Communication: No family at bedside Disposition Plan:   Status is: Inpatient Remains inpatient appropriate because: Admitted for NSTEMI underwent left heart cath found to have proximal LAD obstruction underwent stent angioplasty.  Tolerated well.  PT and OT recommended SNF.   Consultants:  Cardiology  Procedures: Stent angioplasty, s/p ablation Antimicrobials: None  Subjective: Patient was seen and examined at bedside.  Overnight events noted.   Patient reports feeling much better , states she is very weak,  she has not tried to get out of bed. She denies any chest pain, shortness of breath.    Objective: Vitals:   02/27/22 1200 02/27/22 1300 02/27/22 1400 02/27/22 1421  BP: (!) 84/58  (!) 84/67   Pulse: 78   78  Resp: (!) 32 (!) 21 (!) 32 (!) 23  Temp: 98 F (36.7 C)     TempSrc:      SpO2: 93%   100%  Weight:      Height:  Intake/Output Summary (Last 24 hours) at 02/27/2022 1435 Last data filed at 02/27/2022 1300 Gross per 24 hour  Intake 1307.1 ml  Output 1150 ml  Net 157.1 ml   Filed Weights   02-25-22 0911 02/25/22 1500  Weight: 63.4 kg 63.4 kg    Examination:  General exam: Appears comfortable, not in any acute distress, deconditioned. Respiratory system: CTA bilaterally, respiratory effort normal, RR 15 Cardiovascular system: S1-S2  heard, regular rate and rhythm, no murmur. Gastrointestinal system: Abdomen is soft, non tender, non distended, BS+ Central nervous system: Alert and oriented X 3. No focal neurological deficits. Extremities: No edema, no cyanosis, no clubbing Skin: No rashes, lesions or ulcers Psychiatry: Judgement and insight appear normal. Mood & affect appropriate.     Data Reviewed: I have personally reviewed following labs and imaging studies  CBC: Recent Labs  Lab 2022-02-25 0917 02/25/22 0343 02/26/22 0440 02/27/22 0528  WBC 14.1* 23.0* 19.8* 19.7*  NEUTROABS 10.9*  --   --   --   HGB 16.4* 16.5* 14.9 14.4  HCT 49.6* 48.4* 44.5 43.3  MCV 96.7 91.0 93.3 93.9  PLT 238 207 183 193   Basic Metabolic Panel: Recent Labs  Lab 02-25-22 0917 02/25/22 0343 02/25/22 1232 02/26/22 0440 02/27/22 0528  NA 141 141  --  140 136  K 3.7 3.3* 3.7 3.7 3.7  CL 108 103  --  104 102  CO2 24 26  --  25 23  GLUCOSE 162* 125*  --  114* 126*  BUN 20 24*  --  35* 36*  CREATININE 1.02* 1.14*  --  1.21* 1.18*  CALCIUM 8.6* 8.6*  --  8.2* 7.9*  MG 2.0  --  2.0 2.3 2.2   GFR: Estimated Creatinine Clearance: 27.2 mL/min (A) (by C-G formula based on SCr of 1.18 mg/dL (H)). Liver Function Tests: Recent Labs  Lab Feb 25, 2022 0917  AST 151*  ALT 35  ALKPHOS 45  BILITOT 0.7  PROT 6.7  ALBUMIN 3.7   No results for input(s): "LIPASE", "AMYLASE" in the last 168 hours. No results for input(s): "AMMONIA" in the last 168 hours. Coagulation Profile: No results for input(s): "INR", "PROTIME" in the last 168 hours. Cardiac Enzymes: No results for input(s): "CKTOTAL", "CKMB", "CKMBINDEX", "TROPONINI" in the last 168 hours. BNP (last 3 results) No results for input(s): "PROBNP" in the last 8760 hours. HbA1C: No results for input(s): "HGBA1C" in the last 72 hours. CBG: Recent Labs  Lab 02/25/22 0937 2022-02-25 1143  GLUCAP 142* 128*   Lipid Profile: No results for input(s): "CHOL", "HDL", "LDLCALC", "TRIG",  "CHOLHDL", "LDLDIRECT" in the last 72 hours. Thyroid Function Tests: Recent Labs    02/26/22 0440  FREET4 1.41*   Anemia Panel: No results for input(s): "VITAMINB12", "FOLATE", "FERRITIN", "TIBC", "IRON", "RETICCTPCT" in the last 72 hours. Sepsis Labs: No results for input(s): "PROCALCITON", "LATICACIDVEN" in the last 168 hours.  Recent Results (from the past 240 hour(s))  MRSA Next Gen by PCR, Nasal     Status: None   Collection Time: 02-25-22 11:48 AM   Specimen: Nasal Mucosa; Nasal Swab  Result Value Ref Range Status   MRSA by PCR Next Gen NOT DETECTED NOT DETECTED Final    Comment: (NOTE) The GeneXpert MRSA Assay (FDA approved for NASAL specimens only), is one component of a comprehensive MRSA colonization surveillance program. It is not intended to diagnose MRSA infection nor to guide or monitor treatment for MRSA infections. Test performance is not FDA approved in patients less than  65 years old. Performed at [facility], [address], [place], [place] [address]     Radiology Studies: No results found.  Scheduled Meds:  amiodarone  400 mg Oral BID   aspirin  81 mg Oral Daily   atorvastatin  80 mg Oral Daily   Chlorhexidine Gluconate Cloth  6 each Topical Daily   furosemide  20 mg Intravenous Daily   levothyroxine  25 mcg Oral Q0600   metoprolol succinate  12.5 mg Oral Daily   sodium chloride flush  3 mL Intravenous Q12H   ticagrelor  90 mg Oral BID   Continuous Infusions:  sodium chloride     heparin 950 Units/hr (02/27/22 1300)     LOS: 3 days   Time spent: 35 mins   Dawn Canton, MD [facility]   If 7PM-7AM, please contact night-coverage

## 2022-02-27 NOTE — Progress Notes (Signed)
ANTICOAGULATION CONSULT NOTE  Pharmacy Consult for heparin infusion Indication: atrial fibrillation  No Known Allergies  Patient Measurements: Height: 5\' 2"  (157.5 cm) Weight: 63.4 kg (139 lb 12.4 oz) IBW/kg (Calculated) : 50.1 Heparin Dosing Weight: 62.9 kg   Vital Signs: Temp: 98.4 F (36.9 C) (10/27 0400) Temp Source: Oral (10/27 0400) BP: 92/54 (10/27 0800) Pulse Rate: 85 (10/27 0800)  Labs: Recent Labs    02/04/2022 0918 02/27/2022 1250 02/25/22 0343 02/25/22 1410 02/26/22 0008 02/26/22 0440 02/26/22 0806 02/27/22 0528  HGB  --   --  16.5*  --   --  14.9  --  14.4  HCT  --   --  48.4*  --   --  44.5  --  43.3  PLT  --   --  207  --   --  183  --  193  HEPARINUNFRC  --   --  0.93*   < > 0.46  --  0.36 0.18*  CREATININE  --   --  1.14*  --   --  1.21*  --  1.18*  TROPONINIHS 11,143* >24,000*  --   --   --   --   --   --    < > = values in this interval not displayed.     Estimated Creatinine Clearance: 27.2 mL/min (A) (by C-G formula based on SCr of 1.18 mg/dL (H)).   Medical History: Past Medical History:  Diagnosis Date   Arthritis    Chicken pox    Degenerative arthritis of right knee 07/2020   Osteoporosis    Vertigo    x1 over 2 yrs    Assessment: 88 y.o. female with medical history significant for osteoporosis, vertigo and arthritis who was brought into the ER by EMS for evaluation of chest pain and a fall. Pharmacy is asked to start and monitor IV heparin. Patient is on no chronic anticoagulation prior to arrival  Goal of Therapy:  Heparin level 0.3-0.7 units/ml Monitor platelets by anticoagulation protocol: Yes   Plan: heparin level is therapeutic following recent rate change Continue heparin infusion at 950 units/hr Check anti-Xa level in 8 hours to confirm Continue to monitor H&H and platelets (stable currently)  Dallie Piles 02/27/2022,8:35 AM

## 2022-02-28 DIAGNOSIS — I4891 Unspecified atrial fibrillation: Secondary | ICD-10-CM | POA: Diagnosis not present

## 2022-02-28 DIAGNOSIS — I255 Ischemic cardiomyopathy: Secondary | ICD-10-CM | POA: Diagnosis not present

## 2022-02-28 DIAGNOSIS — I5021 Acute systolic (congestive) heart failure: Secondary | ICD-10-CM | POA: Diagnosis not present

## 2022-02-28 DIAGNOSIS — I2102 ST elevation (STEMI) myocardial infarction involving left anterior descending coronary artery: Secondary | ICD-10-CM | POA: Diagnosis not present

## 2022-02-28 LAB — CBC
HCT: 41.2 % (ref 36.0–46.0)
Hemoglobin: 14 g/dL (ref 12.0–15.0)
MCH: 31.7 pg (ref 26.0–34.0)
MCHC: 34 g/dL (ref 30.0–36.0)
MCV: 93.2 fL (ref 80.0–100.0)
Platelets: 210 10*3/uL (ref 150–400)
RBC: 4.42 MIL/uL (ref 3.87–5.11)
RDW: 12.9 % (ref 11.5–15.5)
WBC: 20.5 10*3/uL — ABNORMAL HIGH (ref 4.0–10.5)
nRBC: 0 % (ref 0.0–0.2)

## 2022-02-28 LAB — BASIC METABOLIC PANEL
Anion gap: 11 (ref 5–15)
BUN: 53 mg/dL — ABNORMAL HIGH (ref 8–23)
CO2: 20 mmol/L — ABNORMAL LOW (ref 22–32)
Calcium: 8 mg/dL — ABNORMAL LOW (ref 8.9–10.3)
Chloride: 103 mmol/L (ref 98–111)
Creatinine, Ser: 1.47 mg/dL — ABNORMAL HIGH (ref 0.44–1.00)
GFR, Estimated: 33 mL/min — ABNORMAL LOW (ref >60)
Glucose, Bld: 134 mg/dL — ABNORMAL HIGH (ref 70–99)
Potassium: 3.6 mmol/L (ref 3.5–5.1)
Sodium: 134 mmol/L — ABNORMAL LOW (ref 135–145)

## 2022-02-28 LAB — MAGNESIUM: Magnesium: 2.3 mg/dL (ref 1.7–2.4)

## 2022-02-28 LAB — HEPARIN LEVEL (UNFRACTIONATED)
Heparin Unfractionated: 0.72 IU/mL — ABNORMAL HIGH (ref 0.30–0.70)
Heparin Unfractionated: 0.61 IU/mL (ref 0.30–0.70)
Heparin Unfractionated: >1.1 IU/mL — ABNORMAL HIGH (ref 0.30–0.70)

## 2022-02-28 MED ORDER — CLOPIDOGREL BISULFATE 75 MG PO TABS
300.0000 mg | ORAL_TABLET | Freq: Once | ORAL | Status: AC
  Administered 2022-02-28: 300 mg via ORAL
  Filled 2022-02-28: qty 4

## 2022-02-28 MED ORDER — AMIODARONE IV BOLUS ONLY 150 MG/100ML
INTRAVENOUS | Status: AC
  Administered 2022-02-28: 150 mg
  Filled 2022-02-28: qty 100

## 2022-02-28 MED ORDER — POTASSIUM CHLORIDE CRYS ER 20 MEQ PO TBCR
20.0000 meq | EXTENDED_RELEASE_TABLET | Freq: Once | ORAL | Status: AC
  Administered 2022-02-28: 20 meq via ORAL
  Filled 2022-02-28: qty 1

## 2022-02-28 MED ORDER — AMIODARONE HCL 200 MG PO TABS
200.0000 mg | ORAL_TABLET | Freq: Two times a day (BID) | ORAL | Status: DC
  Administered 2022-02-28: 200 mg via ORAL
  Filled 2022-02-28 (×2): qty 1

## 2022-02-28 MED ORDER — AMIODARONE HCL 200 MG PO TABS: 200.0000 mg | ORAL_TABLET | Freq: Every day | ORAL | Status: DC

## 2022-02-28 MED ORDER — APIXABAN 5 MG PO TABS
5.0000 mg | ORAL_TABLET | Freq: Two times a day (BID) | ORAL | Status: DC
  Administered 2022-02-28 – 2022-03-01 (×3): 5 mg via ORAL
  Filled 2022-02-28 (×3): qty 1

## 2022-02-28 MED ORDER — AMIODARONE IV BOLUS ONLY 150 MG/100ML
150.0000 mg | Freq: Once | INTRAVENOUS | Status: DC
  Filled 2022-02-28: qty 100

## 2022-02-28 MED ORDER — AMIODARONE IV BOLUS ONLY 150 MG/100ML: 150.0000 mg | Freq: Once | INTRAVENOUS | Status: AC

## 2022-02-28 MED ORDER — SODIUM CHLORIDE 0.9 % IV BOLUS
150.0000 mL | Freq: Once | INTRAVENOUS | Status: AC
  Administered 2022-03-01: 150 mL via INTRAVENOUS

## 2022-02-28 MED ORDER — METHOCARBAMOL 500 MG PO TABS
500.0000 mg | ORAL_TABLET | Freq: Once | ORAL | Status: AC
  Administered 2022-02-28: 500 mg via ORAL
  Filled 2022-02-28: qty 1

## 2022-02-28 MED ORDER — CLOPIDOGREL BISULFATE 75 MG PO TABS
75.0000 mg | ORAL_TABLET | Freq: Every day | ORAL | Status: DC
  Administered 2022-03-01: 75 mg via ORAL
  Filled 2022-02-28: qty 1

## 2022-02-28 NOTE — Evaluation (Signed)
Occupational Therapy Evaluation Patient Details Name: Dawn Clark MRN: [id number] DOB: [birth year] Today's Date: 02/28/2022   History of Present Illness Per MD Notes: Dawn Clark is a 88 y.o. female who presented to the ED with c/o chest pain for last 2 days. Pain was persistent and nonradiating.  Upon arrival to the ER she was found to be in A-fib with rapid ventricular rate and required emergent cardioversion.  Twelve-lead EKG showed ST elevations in the anterior lateral leads and patient was taken emergently to the Cath Lab. PMH includes: osteoporosis, vertigo and arthritis.   Clinical Impression   Dawn Clark was seen for OT evaluation this date. Prior to hospital admission, pt was generally independent-MOD I for ADL management using a SPC for functional mobility in the community. Pt lives alone in a 1 level home with 4 STE and bilateral hand rails. Pt reports [place] and would be able to provide PRN assist. Pt presents to acute OT demonstrating impaired ADL performance and functional mobility 2/2 generalized weakness and decreased activity tolerance (See OT problem list). Pt currently requires MAX A for bed mobility and MOD A to maintain sitting balance at EOB. Anticipate MAX A for LB ADL management from STS.  Pt would benefit from skilled OT services to address noted impairments and functional limitations (see below for any additional details) in order to maximize safety and independence while minimizing falls risk and caregiver burden. Upon hospital discharge, recommend STR to maximize pt safety and return to PLOF.        Recommendations for follow up therapy are one component of a multi-disciplinary discharge planning process, led by the attending physician.  Recommendations may be updated based on patient status, additional functional criteria and insurance authorization.   Follow Up Recommendations  Skilled nursing-short term rehab (<3 hours/day)     Assistance Recommended at Discharge Intermittent Supervision/Assistance  Patient can return home with the following A lot of help with bathing/dressing/bathroom;A lot of help with walking and/or transfers;Assistance with cooking/housework;Help with stairs or ramp for entrance;Assist for transportation    Functional Status Assessment  Patient has had a recent decline in their functional status and demonstrates the ability to make significant improvements in function in a reasonable and predictable amount of time.  Equipment Recommendations  BSC/3in1    Recommendations for Other Services       Precautions / Restrictions Precautions Precautions: Fall Restrictions Weight Bearing Restrictions: No      Mobility Bed Mobility Overal bed mobility: Needs Assistance Bed Mobility: Supine to Sit, Sit to Supine     Supine to sit: Max assist, HOB elevated Sit to supine: Max assist   General bed mobility comments: MAX A to boost up in bed. Pt is able to use BLE to assist with repositioning.    Transfers                   General transfer comment: Deferred for pt safety. Pt fatigues quickly sitting EOB and requests return to supine.      Balance Overall balance assessment: Needs assistance Sitting-balance support: Bilateral upper extremity supported, Feet unsupported Sitting balance-Leahy Scale: Poor Sitting balance - Comments: Requires MOD A for EOB sitting balance. Postural control: Posterior lean                                 ADL either performed or assessed with clinical judgement  ADL Overall ADL's : Needs assistance/impaired                                       General ADL Comments: Pt is functionally limited by decreased activity tolerance, generalized weakness, and increased pain with mobility attempts. She requires MAX A to come to sitting at EOB and MOD A to maintain sitting balance with feet unsuported. Pt fatigues quickly at  EOB and is unable to tolerate further functional activity. Anticipate MAX A for LB ADL management in sitting with potential need for +2 assist 2/2 poor sitting balance.     Vision Baseline Vision/History: 1 Wears glasses Ability to See in Adequate Light: 1 Impaired Patient Visual Report: No change from baseline       Perception     Praxis      Pertinent Vitals/Pain Pain Assessment Pain Assessment: Faces Faces Pain Scale: Hurts little more Pain Location: R shoulder with bed mobility Pain Descriptors / Indicators: Grimacing, Guarding, Sore Pain Intervention(s): Limited activity within patient's tolerance, Monitored during session, Repositioned     Hand Dominance Right   Extremity/Trunk Assessment Upper Extremity Assessment Upper Extremity Assessment: Generalized weakness   Lower Extremity Assessment Lower Extremity Assessment: Generalized weakness       Communication Communication Communication: No difficulties   Cognition Arousal/Alertness: Lethargic Behavior During Therapy: WFL for tasks assessed/performed Overall Cognitive Status: Within Functional Limits for tasks assessed                                 General Comments: Oriented to self, place, and basic situational information. Is able to state reason for being in the hospital. Follows 1 step VCs consistently t/o session.     General Comments  Vitals monitored during sesison. Pt on 2L [place] t/o session with spO2 WFL 90-94% with activity. BP noted to be 95/64 (76) at start of session. After EOB sitting attempt BP re-assessed and noted to be 98/65 (76). RN updated on session.    Exercises Other Exercises Other Exercises: Pt educated on role of OT in acute setting, safe use of AE/DME for ADL management, routines modifications to support safety and functional independence upon hospital DC, DC recs, and falls prevention strategies for home and hospital.   Shoulder Instructions      Home Living  Family/patient expects to be discharged to:: Private residence Living Arrangements: Alone Available Help at Discharge: Family;Available PRN/intermittently Type of Home: House Home Access: Stairs to enter Entrance Stairs-Number of Steps: 4 STE with bilat hand rails.   Home Layout: One level     Bathroom Shower/Tub: Teacher, early years/pre: Standard     Home Equipment: Advice worker (2 wheels);Other (comment);Cane - single point;Hand held shower head   Additional Comments: Uses Abbie Sons as primary mobility device when outside of the home.      Prior Functioning/Environment Prior Level of Function : Independent/Modified Independent             Mobility Comments: MOD I with SPC for community mobility ADLs Comments: Pt reports she is generally independent with ADL management, lives alone with family nearby. 1 fall in last 6 months.        OT Problem List: Decreased strength;Decreased coordination;Decreased activity tolerance;Decreased safety awareness;Impaired balance (sitting and/or standing);Decreased knowledge of use of DME or AE  OT Treatment/Interventions: Self-care/ADL training;Therapeutic exercise;Therapeutic activities;DME and/or AE instruction;Patient/family education;Balance training;Energy conservation    OT Goals(Current goals can be found in the care plan section) Acute Rehab OT Goals Patient Stated Goal: To feel better OT Goal Formulation: With patient Time For Goal Achievement: 03/14/22 Potential to Achieve Goals: Good  OT Frequency: Min 2X/week    Co-evaluation              AM-PAC OT "6 Clicks" Daily Activity     Outcome Measure Help from another person eating meals?: A Little Help from another person taking care of personal grooming?: A Little Help from another person toileting, which includes using toliet, bedpan, or urinal?: A Lot Help from another person bathing (including washing, rinsing, drying)?: A Lot Help  from another person to put on and taking off regular upper body clothing?: A Little Help from another person to put on and taking off regular lower body clothing?: A Lot 6 Click Score: 15   End of Session Equipment Utilized During Treatment: Gait belt Nurse Communication: Mobility status  Activity Tolerance: Patient tolerated treatment well;Patient limited by fatigue Patient left: in bed;with call bell/phone within reach;with bed alarm set  OT Visit Diagnosis: Other abnormalities of gait and mobility (R26.89);Muscle weakness (generalized) (M62.81)                Time: 0940-7680 OT Time Calculation (min): 24 min Charges:  OT General Charges $OT Visit: 1 Visit OT Evaluation $OT Eval Moderate Complexity: 1 Mod OT Treatments $Self Care/Home Management : 8-22 mins  Rockney Ghee, M.S., OTR/L 02/28/22, 10:20 AM

## 2022-02-28 NOTE — Plan of Care (Signed)
Continuing with plan of care. 

## 2022-02-28 NOTE — Progress Notes (Signed)
At approximately 1850 patient's heart rate was sustained in the 150s, Dr. Dwyane Dee aware and gave order for amiodarone bolus.

## 2022-02-28 NOTE — Progress Notes (Signed)
PT Cancellation Note  Patient Details Name: Dashea Mcmullan MRN: [id number] DOB: 1928/09/22   Cancelled Treatment:    Reason Eval/Treat Not Completed: Fatigue/lethargy limiting ability to participate (Per Occupational Therapist, pt very exerted for OT eval, significantly fatigued thereafter. Will hold off at this time and attempt PT eval next date.)   Tigerlily Christine C 02/28/2022, 1:22 PM

## 2022-02-28 NOTE — Progress Notes (Signed)
PROGRESS NOTE    Dawn Clark  JSE:[id number] DOB: 1928-06-27 DOA: 03/16/2022  PCP: Jaclyn Shaggy, MD    Brief Narrative:  This 88 years old female with PMH significant for osteoporosis, vertigo, arthritis presented in the ED with complaint of chest pain and s/p fall.  Patient describes chest pain for last 2 days mostly in both breasts and denies having any associated symptoms except shortness of breath.  Pain was persistent and nonradiating.  She has called her neighbors to take her to the urgent care for the evaluation while walking towards her car patient lost her balance and fell backwards hitting her head.  Her friend reported she has lost consciousness for few minutes.  But patient does not remember.  Patient is brought in the ED.  Twelve-lead EKG shows ST elevation in the anterior leads. Patient was emergently taken to the left heart cath, she underwent successful PCI intervention.  Assessment & Plan:   Principal Problem:   ST elevation myocardial infarction (STEMI) (HCC) Active Problems:   Acute systolic CHF (congestive heart failure) (HCC)   Atrial fibrillation with RVR (HCC)   NSVT (nonsustained ventricular tachycardia) (HCC)   Ischemic cardiomyopathy   Weakness  STEMI involving LAD: Patient presented for the evaluation of chest pain and was noted to have EKG changes concerning for anterior lateral STEMI,  She was emergently taken to the left heart cath and found to have occluded ostial/proximal LAD.   Patient successfully underwent PCI with stent angioplasty. Cardiology recommended continue aspirin and Brilinta, metoprolol and high intensity statins. Heparin discontinued.  She is transitioned to Eliquis.  Brilinta discontinued and she is started on Plavix. Continue Plavix and Eliquis after discharge.  Atrial fibrillation, new onset: She presented to the ED for evaluation of chest pain and was found to have new onset rapid A-fib. She is s/p cardioversion and currently in  sinus rhythm.  She is maintaining  sinus rhythm. Initiated heparin infusion, Toprol-XL 12.5 mg twice daily. She has several episodes of paroxysmal A-fib, frequent runs of nonsustained VT throughout the day.   She was started on amiodarone infusion for next 24 hours,  Now she is converted to amiodarone to 400 mg twice daily then 200 mg twice daily. Cardiology has changed amiodarone to 200 mg twice daily for 5 days followed by amiodarone 200 mg daily.  Acute systolic CHF/ Ischemic cardiomyopathy: Patient presented for the evaluation of chest pain and found to have NSTEMI and A-fib, complicated by acute CHF due to her reduced LVEF. Echo shows LVEF less than 25% Continue IV Lasix 20mg  twice daily.   Unable to titrate medications.  Goals of care discussion: Palliative care consulted.   DVT prophylaxis: Eliquis Code Status: Full code Family Communication: No family at bedside Disposition Plan:   Status is: Inpatient Remains inpatient appropriate because: Admitted for NSTEMI underwent left heart cath found to have proximal LAD obstruction underwent stent angioplasty.  Tolerated well.  PT and OT recommended SNF.  Patient has not been able to get out of bed to participate in physical therapy.   Consultants:  Cardiology  Procedures: Stent angioplasty, s/p ablation Antimicrobials: None  Subjective: Patient was seen and examined at bedside.  Overnight events noted.   Patient reports she is feeling very weak, is scared that she may fall if she get out of bed. She denies any chest pain, shortness of breath.  Encouraged to get out of bed and do therap  Objective: Vitals:   02/28/22 0800 02/28/22 0900 02/28/22 1000  02/28/22 1100  BP: 99/65 102/67 97/69 97/67   Pulse: 72 78 76 80  Resp: (!) 31 (!) 28 (!) 22 (!) 23  Temp: 98 F (36.7 C)     TempSrc: Oral     SpO2: 96% 96% 97% 96%  Weight:      Height:        Intake/Output Summary (Last 24 hours) at 02/28/2022 1219 Last data filed at  02/28/2022 1129 Gross per 24 hour  Intake 432.83 ml  Output 300 ml  Net 132.83 ml   Filed Weights   02/11/2022 0911 02/22/2022 1500  Weight: 63.4 kg 63.4 kg    Examination:  General exam: Appears comfortable, not in any acute distress, very deconditioned. Respiratory system: CTA bilaterally, respiratory effort normal, RR 15 Cardiovascular system: S1-S2 heard, regular rate and rhythm, no murmur. Gastrointestinal system: Abdomen is soft, non tender, non distended, BS+ Central nervous system: Alert and oriented x 3, no focal neurological deficits. Extremities: No edema, no cyanosis, no clubbing Skin: No rashes, lesions or ulcers Psychiatry: Judgement and insight appear normal. Mood & affect appropriate.     Data Reviewed: I have personally reviewed following labs and imaging studies  CBC: Recent Labs  Lab 02/25/2022 0917 02/25/22 0343 02/26/22 0440 02/27/22 0528 02/28/22 0449  WBC 14.1* 23.0* 19.8* 19.7* 20.5*  NEUTROABS 10.9*  --   --   --   --   HGB 16.4* 16.5* 14.9 14.4 14.0  HCT 49.6* 48.4* 44.5 43.3 41.2  MCV 96.7 91.0 93.3 93.9 93.2  PLT 238 207 183 193 210   Basic Metabolic Panel: Recent Labs  Lab 02/04/2022 0917 02/25/22 0343 02/25/22 1232 02/26/22 0440 02/27/22 0528 02/28/22 0102  NA 141 141  --  140 136 134*  K 3.7 3.3* 3.7 3.7 3.7 3.6  CL 108 103  --  104 102 103  CO2 24 26  --  25 23 20*  GLUCOSE 162* 125*  --  114* 126* 134*  BUN 20 24*  --  35* 36* 53*  CREATININE 1.02* 1.14*  --  1.21* 1.18* 1.47*  CALCIUM 8.6* 8.6*  --  8.2* 7.9* 8.0*  MG 2.0  --  2.0 2.3 2.2 2.3   GFR: Estimated Creatinine Clearance: 21.8 mL/min (A) (by C-G formula based on SCr of 1.47 mg/dL (H)). Liver Function Tests: Recent Labs  Lab 03/03/2022 0917  AST 151*  ALT 35  ALKPHOS 45  BILITOT 0.7  PROT 6.7  ALBUMIN 3.7   No results for input(s): "LIPASE", "AMYLASE" in the last 168 hours. No results for input(s): "AMMONIA" in the last 168 hours. Coagulation Profile: No  results for input(s): "INR", "PROTIME" in the last 168 hours. Cardiac Enzymes: No results for input(s): "CKTOTAL", "CKMB", "CKMBINDEX", "TROPONINI" in the last 168 hours. BNP (last 3 results) No results for input(s): "PROBNP" in the last 8760 hours. HbA1C: No results for input(s): "HGBA1C" in the last 72 hours. CBG: Recent Labs  Lab 02/16/2022 0937 03/01/2022 1143  GLUCAP 142* 128*   Lipid Profile: No results for input(s): "CHOL", "HDL", "LDLCALC", "TRIG", "CHOLHDL", "LDLDIRECT" in the last 72 hours. Thyroid Function Tests: Recent Labs    02/26/22 0440  FREET4 1.41*   Anemia Panel: No results for input(s): "VITAMINB12", "FOLATE", "FERRITIN", "TIBC", "IRON", "RETICCTPCT" in the last 72 hours. Sepsis Labs: No results for input(s): "PROCALCITON", "LATICACIDVEN" in the last 168 hours.  Recent Results (from the past 240 hour(s))  MRSA Next Gen by PCR, Nasal     Status: None  Collection Time: 02/19/2022 11:48 AM   Specimen: Nasal Mucosa; Nasal Swab  Result Value Ref Range Status   MRSA by PCR Next Gen NOT DETECTED NOT DETECTED Final    Comment: (NOTE) The GeneXpert MRSA Assay (FDA approved for NASAL specimens only), is one component of a comprehensive MRSA colonization surveillance program. It is not intended to diagnose MRSA infection nor to guide or monitor treatment for MRSA infections. Test performance is not FDA approved in patients less than 77 years old. Performed at [facility], [address], [place], [place] [address]     Radiology Studies: No results found.  Scheduled Meds:  amiodarone  200 mg Oral BID   Followed by   Derrill Memo ON 03/06/2022] amiodarone  200 mg Oral Daily   apixaban  5 mg Oral BID   aspirin  81 mg Oral Daily   atorvastatin  80 mg Oral Daily   Chlorhexidine Gluconate Cloth  6 each Topical Daily   clopidogrel  300 mg Oral Once   [START ON 03/01/2022] clopidogrel  75 mg Oral Daily   levothyroxine  25 mcg Oral Q0600   metoprolol succinate   12.5 mg Oral Daily   potassium chloride  20 mEq Oral Once   sodium chloride flush  3 mL Intravenous Q12H   Continuous Infusions:  sodium chloride       LOS: 4 days   Time spent: 35 mins   Sharona Rovner, MD [facility]   If 7PM-7AM, please contact night-coverage

## 2022-02-28 NOTE — Progress Notes (Signed)
ANTICOAGULATION CONSULT NOTE  Pharmacy Consult for heparin infusion Indication: atrial fibrillation  No Known Allergies  Patient Measurements: Height: 5\' 2"  (157.5 cm) Weight: 63.4 kg (139 lb 12.4 oz) IBW/kg (Calculated) : 50.1 Heparin Dosing Weight: 62.9 kg   Vital Signs: Temp: 98.3 F (36.8 C) (10/27 2000) Temp Source: Oral (10/27 2000) BP: 92/72 (10/27 2100) Pulse Rate: 76 (10/27 2100)  Labs: Recent Labs    02/25/22 0343 02/25/22 1410 02/26/22 0440 02/26/22 0806 02/27/22 0528 02/27/22 1428 02/28/22 0102  HGB 16.5*  --  14.9  --  14.4  --   --   HCT 48.4*  --  44.5  --  43.3  --   --   PLT 207  --  183  --  193  --   --   HEPARINUNFRC 0.93*   < >  --    < > 0.18* 0.58 0.72*  CREATININE 1.14*  --  1.21*  --  1.18*  --  1.47*   < > = values in this interval not displayed.     Estimated Creatinine Clearance: 21.8 mL/min (A) (by C-G formula based on SCr of 1.47 mg/dL (H)).   Medical History: Past Medical History:  Diagnosis Date   Arthritis    Chicken pox    Degenerative arthritis of right knee 07/2020   Osteoporosis    Vertigo    x1 over 2 yrs    Assessment: 88 y.o. female with medical history significant for osteoporosis, vertigo and arthritis who was brought into the ER by EMS for evaluation of chest pain and a fall. Pharmacy is asked to start and monitor IV heparin. Patient is on no chronic anticoagulation prior to arrival   Goal of Therapy:  Heparin level 0.3-0.7 units/ml Monitor platelets by anticoagulation protocol: Yes   Plan: heparin level is slightly supra-therapeutic  Reduce heparin infusion to 900 units/hr Check anti-Xa level 8 hours after rate change Continue to monitor H&H and platelets (stable currently)  Wynelle Cleveland 02/28/2022,1:31 AM

## 2022-02-28 NOTE — Progress Notes (Signed)
Rounding Note    Patient Name: Dawn Clark Date of Encounter: 02/28/2022  [facility] Cardiologist: New  Subjective   Patient denies chest pain. BP still low. Has not been up and about.   Inpatient Medications    Scheduled Meds:  amiodarone  400 mg Oral BID   aspirin  81 mg Oral Daily   atorvastatin  80 mg Oral Daily   Chlorhexidine Gluconate Cloth  6 each Topical Daily   furosemide  20 mg Intravenous Daily   levothyroxine  25 mcg Oral Q0600   metoprolol succinate  12.5 mg Oral Daily   sodium chloride flush  3 mL Intravenous Q12H   ticagrelor  90 mg Oral BID   Continuous Infusions:  sodium chloride     heparin 900 Units/hr (02/28/22 0307)   PRN Meds: sodium chloride, acetaminophen, ondansetron (ZOFRAN) IV, mouth rinse, sodium chloride flush   Vital Signs    Vitals:   02/28/22 0300 02/28/22 0400 02/28/22 0500 02/28/22 0600  BP: (!) 104/91 91/64 102/64 101/63  Pulse: (!) 36 72 77 73  Resp: (!) 25 (!) 25 (!) 38 (!) 39  Temp:  98.5 F (36.9 C)    TempSrc:  Oral    SpO2: (!) 88% 97% 96% 98%  Weight:      Height:        Intake/Output Summary (Last 24 hours) at 02/28/2022 0753 Last data filed at 02/28/2022 0307 Gross per 24 hour  Intake 811.08 ml  Output 400 ml  Net 411.08 ml      02/09/2022    3:00 PM 02/02/2022    9:11 AM 08/28/2020   10:28 AM  Last 3 Weights  Weight (lbs) 139 lb 12.4 oz 139 lb 11.2 oz 143 lb  Weight (kg) 63.4 kg 63.368 kg 64.864 kg      Telemetry    SR/ST, HR 70-100 - Personally Reviewed  ECG    No new - Personally Reviewed  Physical Exam   GEN: No acute distress.   Neck: No JVD Cardiac: RRR, no murmurs, rubs, or gallops.  Respiratory: Clear to auscultation bilaterally. GI: Soft, nontender, non-distended  MS: No edema; No deformity. Neuro:  Nonfocal  Psych: Normal affect   Labs    High Sensitivity Troponin:   Recent Labs  Lab 02/23/2022 0918 02/04/2022 1250  TROPONINIHS 11,143* >24,000*      Chemistry Recent Labs  Lab 02/11/2022 0917 02/25/22 0343 02/26/22 0440 02/27/22 0528 02/28/22 0102  NA 141   < > 140 136 134*  K 3.7   < > 3.7 3.7 3.6  CL 108   < > 104 102 103  CO2 24   < > 25 23 20*  GLUCOSE 162*   < > 114* 126* 134*  BUN 20   < > 35* 36* 53*  CREATININE 1.02*   < > 1.21* 1.18* 1.47*  CALCIUM 8.6*   < > 8.2* 7.9* 8.0*  MG 2.0   < > 2.3 2.2 2.3  PROT 6.7  --   --   --   --   ALBUMIN 3.7  --   --   --   --   AST 151*  --   --   --   --   ALT 35  --   --   --   --   ALKPHOS 45  --   --   --   --   BILITOT 0.7  --   --   --   --  GFRNONAA 52*   < > 42* 44* 33*  ANIONGAP 9   < > 11 11 11    < > = values in this interval not displayed.    Lipids No results for input(s): "CHOL", "TRIG", "HDL", "LABVLDL", "LDLCALC", "CHOLHDL" in the last 168 hours.  Hematology Recent Labs  Lab 02/26/22 0440 02/27/22 0528 02/28/22 0449  WBC 19.8* 19.7* 20.5*  RBC 4.77 4.61 4.42  HGB 14.9 14.4 14.0  HCT 44.5 43.3 41.2  MCV 93.3 93.9 93.2  MCH 31.2 31.2 31.7  MCHC 33.5 33.3 34.0  RDW 12.9 13.0 12.9  PLT 183 193 210   Thyroid  Recent Labs  Lab 03/07/2022 0917 02/26/22 0440  TSH 5.113*  --   FREET4  --  1.41*    BNPNo results for input(s): "BNP", "PROBNP" in the last 168 hours.  DDimer No results for input(s): "DDIMER" in the last 168 hours.   Radiology    No results found.  Cardiac Studies   Echo Left ventricular ejection fraction, by estimation, is 25 to 30%. The  left ventricle has severely decreased function. The left ventricle  demonstrates regional wall motion abnormalities (see scoring  diagram/findings for description). Left ventricular  diastolic parameters are consistent with Grade I diastolic dysfunction  (impaired relaxation). There is severe hypokinesis of the left  ventricular, entire anteroseptal wall, anterior wall, anterolateral wall  and apical segment.   2. There is hypokinesis of the right ventricular apex with otherwise  preserved RV  contraction. The right ventricular size is normal. There is  mildly elevated pulmonary artery systolic pressure.   3. The mitral valve is degenerative. Mild mitral valve regurgitation. No  evidence of mitral stenosis. Moderate mitral annular calcification.   4. Tricuspid valve regurgitation is mild to moderate.   5. The aortic valve is tricuspid. Aortic valve regurgitation is mild. No  aortic stenosis is present.   6. The inferior vena cava is normal in size with <50% respiratory    Cardiac catheterization Severe single-vessel coronary artery disease with thrombotic occlusion of the ostial/proximal LAD followed by sequential 30% mid and 70% mid/distal LAD stenoses.  30% ostial stenosis is also observed in OM1 branch of dominant LCx. Severely reduced LVEF with anterior hypokinesis/akinesis.  LVEF 25-30% with moderately elevated filling pressure (LVEDP 32 mmHg). Successful PCI to ostial/proximal LAD using Onyx Frontier 2.5 x 15 mm drug-eluting stent (postdilated to 2.8 mm) with 0% residual stenosis and TIMI-2 flow in the distal vessel, likely related to microvascular dysfunction in the setting of late presenting STEMI.  Patient Profile     88 y.o. female who presented with chest pain, diagnosed with new onset atrial fibrillation and anterior lateral STEMI  Assessment & Plan    Anterolateral STEMI - s/p DES to pLAD on 10/24 - patient denies chest pain - IV heparin - DAPT with ASA and Brilinta, plan for Eliquis and plavix at discharge - continue Lipitor and Toprol - patient with weakness, will likely need SNF at d/c   HFrEF ICM - LVEF 25-30%, new this admission - continue Toprol 12.5mg  daily - BP low, limiting GDMT - IV lasix 20mg  daily>hold for increase in BUN/SCR - may need lasix 20mg  daily at discharge   Paroxysmal Afib, new onset - s/p emergent cardioversion in the ER 10/24 - brief afib on 10/15 and tarted on IV amiodarone>now on oral amiodarone - remains in NSR - IV  heparin - Toprol 12.5mg  daily - Plan to start Eliquis at discharge   Frequent ectopy -continue  amiodarone - Toprol 12.5mg  daily  For questions or updates, please contact [place] Please consult [email or web address] for contact info under        Signed, Etienne Mowers Ninfa Meeker, PA-C  02/28/2022, 7:53 AM

## 2022-02-28 NOTE — Progress Notes (Signed)
ANTICOAGULATION CONSULT NOTE  Pharmacy Consult for heparin infusion Indication: atrial fibrillation  No Known Allergies  Patient Measurements: Height: 5\' 2"  (157.5 cm) Weight: 63.4 kg (139 lb 12.4 oz) IBW/kg (Calculated) : 50.1 Heparin Dosing Weight: 62.9 kg   Vital Signs: Temp: 98.5 F (36.9 C) (10/28 0400) Temp Source: Oral (10/28 0400) BP: 101/63 (10/28 0600) Pulse Rate: 73 (10/28 0600)  Labs: Recent Labs    02/26/22 0440 02/26/22 0806 02/27/22 0528 02/27/22 1428 02/28/22 0102 02/28/22 0449 02/28/22 0941  HGB 14.9  --  14.4  --   --  14.0  --   HCT 44.5  --  43.3  --   --  41.2  --   PLT 183  --  193  --   --  210  --   HEPARINUNFRC  --    < > 0.18* 0.58 0.72*  --  0.61  CREATININE 1.21*  --  1.18*  --  1.47*  --   --    < > = values in this interval not displayed.     Estimated Creatinine Clearance: 21.8 mL/min (A) (by C-G formula based on SCr of 1.47 mg/dL (H)).   Medical History: Past Medical History:  Diagnosis Date   Arthritis    Chicken pox    Degenerative arthritis of right knee 07/2020   Osteoporosis    Vertigo    x1 over 2 yrs    Assessment: 88 y.o. female with medical history significant for osteoporosis, vertigo and arthritis who was brought into the ER by EMS for evaluation of chest pain and a fall. Pharmacy is asked to start and monitor IV heparin. Patient is on no chronic anticoagulation prior to arrival.   10/28 01:02 HL 0.72  10/28 09:41 HL 0.61    Goal of Therapy:  Heparin level 0.3-0.7 units/ml Monitor platelets by anticoagulation protocol: Yes   Plan:  Heparin level is therapeutic. Will continue heparin infusion at 900 units/hr. Recheck heparin level in 8 hours. CBC daily while on heparin.     Oswald Hillock, PharmD, BPCS 02/28/2022,10:43 AM

## 2022-02-28 NOTE — Progress Notes (Addendum)
[place] for Electrolyte Monitoring and Replacement   Recent Labs: Potassium (mmol/L)  Date Value  02/28/2022 3.6   Magnesium (mg/dL)  Date Value  02/28/2022 2.3   Calcium (mg/dL)  Date Value  02/28/2022 8.0 (L)   Albumin (g/dL)  Date Value  03-01-22 3.7   Sodium (mmol/L)  Date Value  02/28/2022 134 (L)    Assessment: 89YOF w/ PMH of OA, vertigo presenting with chest pain s/p STEMI / emergent DCCV, new-onset atrial fibrillation followed by cardiology. Pharmacy is asked to follow and replace electrolytes while in CCU.  Diuretics: furosemide 20 mg IV once daily - stopped.   Goal of Therapy:  Potassium 4.0 - 5.1 mmol/L Magnesium 2.0 - 2.4 mg/dL All Other Electrolytes WNL  Plan:  Kcl 20 mEq x 1 PO.  Recheck electrolytes in am  Oswald Hillock ,PharmD Clinical Pharmacist 02/28/2022 10:52 AM

## 2022-03-01 ENCOUNTER — Inpatient Hospital Stay: Payer: Medicare HMO

## 2022-03-01 ENCOUNTER — Inpatient Hospital Stay (HOSPITAL_COMMUNITY)
Admit: 2022-03-01 | Discharge: 2022-03-01 | Disposition: A | Discharge status: Home / Self Care | Payer: Medicare HMO | Attending: Cardiovascular Disease | Admitting: Cardiovascular Disease

## 2022-03-01 DIAGNOSIS — R570 Cardiogenic shock: Secondary | ICD-10-CM

## 2022-03-01 DIAGNOSIS — I5021 Acute systolic (congestive) heart failure: Secondary | ICD-10-CM | POA: Diagnosis not present

## 2022-03-01 DIAGNOSIS — I4891 Unspecified atrial fibrillation: Secondary | ICD-10-CM | POA: Diagnosis not present

## 2022-03-01 DIAGNOSIS — I255 Ischemic cardiomyopathy: Secondary | ICD-10-CM | POA: Diagnosis not present

## 2022-03-01 DIAGNOSIS — I2510 Atherosclerotic heart disease of native coronary artery without angina pectoris: Secondary | ICD-10-CM

## 2022-03-01 DIAGNOSIS — I2102 ST elevation (STEMI) myocardial infarction involving left anterior descending coronary artery: Secondary | ICD-10-CM | POA: Diagnosis not present

## 2022-03-01 LAB — CBC
HCT: 39.1 % (ref 36.0–46.0)
Hemoglobin: 12.9 g/dL (ref 12.0–15.0)
MCH: 30.9 pg (ref 26.0–34.0)
MCHC: 33 g/dL (ref 30.0–36.0)
MCV: 93.8 fL (ref 80.0–100.0)
Platelets: 233 10*3/uL (ref 150–400)
RBC: 4.17 MIL/uL (ref 3.87–5.11)
RDW: 13 % (ref 11.5–15.5)
WBC: 14.4 10*3/uL — ABNORMAL HIGH (ref 4.0–10.5)
nRBC: 0 % (ref 0.0–0.2)

## 2022-03-01 LAB — BASIC METABOLIC PANEL
Anion gap: 11 (ref 5–15)
BUN: 54 mg/dL — ABNORMAL HIGH (ref 8–23)
CO2: 20 mmol/L — ABNORMAL LOW (ref 22–32)
Calcium: 7.8 mg/dL — ABNORMAL LOW (ref 8.9–10.3)
Chloride: 104 mmol/L (ref 98–111)
Creatinine, Ser: 1.32 mg/dL — ABNORMAL HIGH (ref 0.44–1.00)
GFR, Estimated: 38 mL/min — ABNORMAL LOW (ref >60)
Glucose, Bld: 205 mg/dL — ABNORMAL HIGH (ref 70–99)
Potassium: 3.3 mmol/L — ABNORMAL LOW (ref 3.5–5.1)
Sodium: 135 mmol/L (ref 135–145)

## 2022-03-01 LAB — ECHOCARDIOGRAM LIMITED
Area-P 1/2: 8.25 cm2
Calc EF: 21.9 %
Height: 62 in
MV M vel: 3.61 m/s
MV Peak grad: 52.1 mmHg
Radius: 0.3 cm
S' Lateral: 4.2 cm
Single Plane A2C EF: 19.4 %
Single Plane A4C EF: 21.8 %
Weight: 2236.35 oz

## 2022-03-01 LAB — HEPARIN LEVEL (UNFRACTIONATED): Heparin Unfractionated: >1.1 IU/mL — ABNORMAL HIGH (ref 0.30–0.70)

## 2022-03-01 LAB — APTT: aPTT: 42 seconds — ABNORMAL HIGH (ref 24–36)

## 2022-03-01 LAB — TSH: TSH: 1.855 u[IU]/mL (ref 0.350–4.500)

## 2022-03-01 LAB — T4, FREE: Free T4: 1.34 ng/dL — ABNORMAL HIGH (ref 0.61–1.12)

## 2022-03-01 LAB — PROTIME-INR
INR: 1.8 — ABNORMAL HIGH (ref 0.8–1.2)
Prothrombin Time: 20.6 seconds — ABNORMAL HIGH (ref 11.4–15.2)

## 2022-03-01 LAB — LACTIC ACID, PLASMA: Lactic Acid, Venous: 2.1 mmol/L (ref 0.5–1.9)

## 2022-03-01 MED ORDER — AMIODARONE HCL IN DEXTROSE 360-4.14 MG/200ML-% IV SOLN: 30.0000 mg/h | INTRAVENOUS | Status: DC

## 2022-03-01 MED ORDER — AMIODARONE HCL IN DEXTROSE 360-4.14 MG/200ML-% IV SOLN
INTRAVENOUS | Status: AC
  Filled 2022-03-01: qty 200

## 2022-03-01 MED ORDER — NITROGLYCERIN 0.4 MG SL SUBL
SUBLINGUAL_TABLET | SUBLINGUAL | Status: AC
  Administered 2022-03-01: 0.4 mg
  Filled 2022-03-01: qty 1

## 2022-03-01 MED ORDER — AMIODARONE IV BOLUS ONLY 150 MG/100ML
INTRAVENOUS | Status: AC
  Filled 2022-03-01: qty 100

## 2022-03-01 MED ORDER — MORPHINE SULFATE (PF) 2 MG/ML IV SOLN
1.0000 mg | INTRAVENOUS | Status: DC | PRN
  Administered 2022-03-01 – 2022-03-02 (×2): 1 mg via INTRAVENOUS
  Filled 2022-03-01 (×2): qty 1

## 2022-03-01 MED ORDER — LORAZEPAM 2 MG/ML IJ SOLN
0.5000 mg | INTRAMUSCULAR | Status: DC
  Administered 2022-03-01 – 2022-03-04 (×14): 0.5 mg via INTRAVENOUS
  Filled 2022-03-01 (×14): qty 1

## 2022-03-01 MED ORDER — FUROSEMIDE 10 MG/ML IJ SOLN
60.0000 mg | Freq: Once | INTRAMUSCULAR | Status: AC
  Administered 2022-03-01: 60 mg via INTRAVENOUS
  Filled 2022-03-01: qty 6

## 2022-03-01 MED ORDER — MORPHINE 100MG IN NS 100ML (1MG/ML) PREMIX INFUSION
1.0000 mg/h | INTRAVENOUS | Status: DC
  Administered 2022-03-01: 1 mg/h via INTRAVENOUS
  Filled 2022-03-01: qty 100

## 2022-03-01 MED ORDER — DIGOXIN 125 MCG PO TABS
0.1250 mg | ORAL_TABLET | Freq: Every day | ORAL | Status: DC
  Administered 2022-03-01: 0.125 mg via ORAL
  Filled 2022-03-01: qty 1

## 2022-03-01 MED ORDER — SODIUM CHLORIDE 0.9 % IV BOLUS: 250.0000 mL | Freq: Once | INTRAVENOUS | Status: DC

## 2022-03-01 MED ORDER — TRAMADOL HCL 50 MG PO TABS
50.0000 mg | ORAL_TABLET | Freq: Four times a day (QID) | ORAL | Status: DC | PRN
  Administered 2022-03-01: 50 mg via ORAL
  Filled 2022-03-01: qty 1

## 2022-03-01 MED ORDER — POTASSIUM CHLORIDE CRYS ER 20 MEQ PO TBCR
40.0000 meq | EXTENDED_RELEASE_TABLET | ORAL | Status: DC
  Administered 2022-03-01: 40 meq via ORAL
  Filled 2022-03-01: qty 2

## 2022-03-01 MED ORDER — HEPARIN (PORCINE) 25000 UT/250ML-% IV SOLN
900.0000 [IU]/h | INTRAVENOUS | Status: DC
  Administered 2022-03-01: 900 [IU]/h via INTRAVENOUS
  Filled 2022-03-01: qty 250

## 2022-03-01 MED ORDER — PERFLUTREN LIPID MICROSPHERE
1.0000 mL | INTRAVENOUS | Status: AC | PRN
  Administered 2022-03-01: 2 mL via INTRAVENOUS

## 2022-03-01 NOTE — Progress Notes (Signed)
ANTICOAGULATION CONSULT NOTE  Pharmacy Consult for heparin infusion Indication: atrial fibrillation  No Known Allergies  Patient Measurements: Height: 5\' 2"  (157.5 cm) Weight: 63.4 kg (139 lb 12.4 oz) IBW/kg (Calculated) : 50.1 Heparin Dosing Weight: 62.9 kg   Vital Signs: BP: 98/68 (10/29 1000) Pulse Rate: 77 (10/29 1000)  Labs: Recent Labs    02/27/22 0528 02/27/22 1428 02/28/22 0102 02/28/22 0449 02/28/22 0941 02/28/22 1603 03/01/22 0550  HGB 14.4  --   --  14.0  --   --  12.9  HCT 43.3  --   --  41.2  --   --  39.1  PLT 193  --   --  210  --   --  233  HEPARINUNFRC 0.18*   < > 0.72*  --  0.61 >1.10*  --   CREATININE 1.18*  --  1.47*  --   --   --   --    < > = values in this interval not displayed.     Estimated Creatinine Clearance: 21.8 mL/min (A) (by C-G formula based on SCr of 1.47 mg/dL (H)).   Medical History: Past Medical History:  Diagnosis Date   Arthritis    Chicken pox    Degenerative arthritis of right knee 07/2020   Osteoporosis    Vertigo    x1 over 2 yrs    Assessment: 88 y.o. female with medical history significant for osteoporosis, vertigo and arthritis who was brought into the ER by EMS for evaluation of chest pain and a fall. Pharmacy is asked to start and monitor IV heparin. Patient is on no chronic anticoagulation prior to arrival but was transitioned to Eliquis 5 mg BID on 10/28. Per cardiology, transitioning back to heparin in case treatment team decides to proceed with vasopressor support.  Baseline Labs: Baseline Anti-Xa level - ordered aPTT - ordered; INR - ordered Hgb - 12.9; Plts - 233  Goal of Therapy:  Heparin level 0.3-0.7 units/ml Monitor platelets by anticoagulation protocol: Yes   Plan: Discontinue apixaban and restart heparin at 900 un/hr No bolus given due to last dose of apixaban this morning, 10/29, at 1011 Check aPTT/Anti-Xa level in 8 hours and daily once consecutively therapeutic  Titrate by aPTT's until  lab correlation is noted, then titrate by anti-xa alone Continue to monitor H&H and platelets daily while on heparin gtt  Dara Hoyer, PharmD PGY-1 Pharmacy Resident 03/01/2022 10:53 AM

## 2022-03-01 NOTE — Progress Notes (Signed)
At 0600 bladderscanned patient again, only 200 in bladder at this time.

## 2022-03-01 NOTE — Progress Notes (Signed)
Report given to receiving nurse on 1A, patient transferred in hospital bed to new room where comfort care will be resumed.

## 2022-03-01 NOTE — Plan of Care (Signed)
Continuing with plan of care. 

## 2022-03-01 NOTE — Progress Notes (Signed)
PROGRESS NOTE    Dawn Clark  IRC:[id number] DOB: [birth year] DOA: 03/08/2022  PCP: Albina Billet, MD    Brief Narrative:  This 88 years old female with PMH significant for osteoporosis, vertigo, arthritis presented in the ED with complaint of chest pain and s/p fall.  Patient describes chest pain for last 2 days mostly in both breasts and denies having any associated symptoms except shortness of breath.  Pain was persistent and nonradiating.  She has called her neighbors to take her to the urgent care for the evaluation while walking towards her car patient lost her balance and fell backwards hitting her head.  Her friend reported she has lost consciousness for few minutes.  But patient does not remember.  Patient is brought in the ED.  Twelve-lead EKG shows ST elevation in the anterior leads. Patient was emergently taken to the left heart cath, she underwent successful PCI intervention.  Patient has a downward decline.  Goals of care discussion completed by cardiologist.  Care transitioned to full comfort care.  Assessment & Plan:   Principal Problem:   ST elevation myocardial infarction (STEMI) ([place]) Active Problems:   Acute systolic CHF (congestive heart failure) (HCC)   Atrial fibrillation with RVR (HCC)   NSVT (nonsustained ventricular tachycardia) (HCC)   Ischemic cardiomyopathy   Weakness  STEMI involving LAD: Patient presented for the evaluation of chest pain and was noted to have EKG changes concerning for anterior lateral STEMI,  She was emergently taken to the left heart cath and found to have occluded ostial/proximal LAD.   Patient successfully underwent PCI with stent angioplasty. Cardiology recommended continue aspirin and Brilinta, metoprolol and high intensity statins. Heparin discontinued.  She is transitioned to Eliquis.  Brilinta discontinued and she is started on Plavix. Continue Plavix and Eliquis after discharge. Patient has a downward decline.  Goals of care  discussion completed by cardiology. Care transitioned to full comfort care.  Atrial fibrillation, new onset: She presented to the ED for evaluation of chest pain and was found to have new onset rapid A-fib. She is s/p cardioversion and currently in sinus rhythm.  She is maintaining  sinus rhythm. Initiated heparin infusion, Toprol-XL 12.5 mg twice daily. She has several episodes of paroxysmal A-fib, frequent runs of nonsustained VT throughout the day.   She was started on amiodarone infusion for next 24 hours,  She continued to remain in and out of A-fib with RVR, and with low blood pressure she was started on amiodarone infusion. Cardiologist had a long discussion with son,  goals of care discussed and care transitioned to full comfort care.  Acute systolic CHF/ Ischemic cardiomyopathy: Patient presented for the evaluation of chest pain and found to have NSTEMI and A-fib, complicated by acute CHF due to her reduced LVEF. Echo shows LVEF less than 25% Continue IV Lasix 20mg  twice daily.   Unable to titrate medications.  Goals of care discussion: Palliative care consulted.  She has worsening heart failure symptoms with hypotension. Care transitioned to full comfort care.  DNR DNI signed.  DVT prophylaxis: Eliquis Code Status: Full code Family Communication: No family at bedside Disposition Plan:   Status is: Inpatient Remains inpatient appropriate because: Admitted for NSTEMI underwent left heart cath found to have proximal LAD obstruction underwent stent angioplasty.  Tolerated well.  PT and OT recommended SNF.  Patient has not been able to get out of bed to participate in physical therapy. Goals of care discussion completed by cardiologist.  Care transitioned to  full comfort care. Consultants:  Cardiology  Procedures: Stent angioplasty, s/p ablation Antimicrobials: None  Subjective: Patient was seen and examined at bedside.  Overnight events noted.   Patient reports she is  feeling very weak. She remained in A-fib with RVR throughout the night. She also remains hypotensive,  appears like she is in cardiogenic shock. Goals of care discussion completed with the family and care transitioned to full comfort care.  Objective: Vitals:   03/01/22 1130 03/01/22 1200 03/01/22 1230 03/01/22 1300  BP: 93/66 107/65 101/64 (!) 97/59  Pulse: 73 66 73 74  Resp: (!) 35 (!) 23 (!) 25 18  Temp:  98.4 F (36.9 C)    TempSrc:  Oral    SpO2: 96% 95% 96% 96%  Weight:      Height:        Intake/Output Summary (Last 24 hours) at 03/01/2022 1435 Last data filed at 03/01/2022 1331 Gross per 24 hour  Intake 359.48 ml  Output 401 ml  Net -41.52 ml   Filed Weights   02/21/2022 0911 02/13/2022 1500  Weight: 63.4 kg 63.4 kg    Examination:  General exam: Appears chronically ill looking, very deconditioned.  Not in distress Respiratory system: Bibasilar crackles, respiratory effort normal, RR 20 Cardiovascular system: S1-S2 heard, irregular rhythm, no murmur Gastrointestinal system: Abdomen is soft, non tender, non distended, BS+ Central nervous system: Alert and oriented x 2, no focal neurological deficits. Extremities: No edema, no cyanosis, no clubbing Skin: No rashes, lesions or ulcers Psychiatry: Judgement and insight appear normal. Mood & affect appropriate.     Data Reviewed: I have personally reviewed following labs and imaging studies  CBC: Recent Labs  Lab 02/16/2022 0917 02/25/22 0343 02/26/22 0440 02/27/22 0528 02/28/22 0449 03/01/22 0550  WBC 14.1* 23.0* 19.8* 19.7* 20.5* 14.4*  NEUTROABS 10.9*  --   --   --   --   --   HGB 16.4* 16.5* 14.9 14.4 14.0 12.9  HCT 49.6* 48.4* 44.5 43.3 41.2 39.1  MCV 96.7 91.0 93.3 93.9 93.2 93.8  PLT 238 207 183 193 210 0000000   Basic Metabolic Panel: Recent Labs  Lab 02/23/2022 0917 02/25/22 0343 02/25/22 1232 02/26/22 0440 02/27/22 0528 02/28/22 0102 03/01/22 1036  NA 141 141  --  140 136 134* 135  K 3.7 3.3*  3.7 3.7 3.7 3.6 3.3*  CL 108 103  --  104 102 103 104  CO2 24 26  --  25 23 20* 20*  GLUCOSE 162* 125*  --  114* 126* 134* 205*  BUN 20 24*  --  35* 36* 53* 54*  CREATININE 1.02* 1.14*  --  1.21* 1.18* 1.47* 1.32*  CALCIUM 8.6* 8.6*  --  8.2* 7.9* 8.0* 7.8*  MG 2.0  --  2.0 2.3 2.2 2.3  --    GFR: Estimated Creatinine Clearance: 24.3 mL/min (A) (by C-G formula based on SCr of 1.32 mg/dL (H)). Liver Function Tests: Recent Labs  Lab 03/03/2022 0917  AST 151*  ALT 35  ALKPHOS 45  BILITOT 0.7  PROT 6.7  ALBUMIN 3.7   No results for input(s): "LIPASE", "AMYLASE" in the last 168 hours. No results for input(s): "AMMONIA" in the last 168 hours. Coagulation Profile: Recent Labs  Lab 03/01/22 1104  INR 1.8*   Cardiac Enzymes: No results for input(s): "CKTOTAL", "CKMB", "CKMBINDEX", "TROPONINI" in the last 168 hours. BNP (last 3 results) No results for input(s): "PROBNP" in the last 8760 hours. HbA1C: No results for input(s): "HGBA1C"  in the last 72 hours. CBG: Recent Labs  Lab 02/19/2022 0937 02/12/2022 1143  GLUCAP 142* 128*   Lipid Profile: No results for input(s): "CHOL", "HDL", "LDLCALC", "TRIG", "CHOLHDL", "LDLDIRECT" in the last 72 hours. Thyroid Function Tests: Recent Labs    03/01/22 0550  TSH 1.855  FREET4 1.34*   Anemia Panel: No results for input(s): "VITAMINB12", "FOLATE", "FERRITIN", "TIBC", "IRON", "RETICCTPCT" in the last 72 hours. Sepsis Labs: Recent Labs  Lab 03/01/22 1036  LATICACIDVEN 2.1*    Recent Results (from the past 240 hour(s))  MRSA Next Gen by PCR, Nasal     Status: None   Collection Time: 02/23/2022 11:48 AM   Specimen: Nasal Mucosa; Nasal Swab  Result Value Ref Range Status   MRSA by PCR Next Gen NOT DETECTED NOT DETECTED Final    Comment: (NOTE) The GeneXpert MRSA Assay (FDA approved for NASAL specimens only), is one component of a comprehensive MRSA colonization surveillance program. It is not intended to diagnose MRSA infection nor  to guide or monitor treatment for MRSA infections. Test performance is not FDA approved in patients less than 28 years old. Performed at [facility], [address], [place], [place] [address]     Radiology Studies: [facility] Chest [place] 1 View  Result Date: 03/01/2022 CLINICAL DATA:  Short of breath.  Chest pain. EXAM: PORTABLE CHEST 1 VIEW COMPARISON:  02/22/2022. FINDINGS: Cardiac silhouette mildly enlarged.  No mediastinal or hilar masses. Bilateral vascular congestion with interstitial thickening. Mild hazy opacities in the mid to lower lungs. Suspect small effusions. No pneumothorax. Skeletal structures are demineralized, grossly intact. IMPRESSION: 1. Findings similar to the prior study with vascular congestion, interstitial thickening and mild cardiomegaly suggesting mild congestive heart failure with pulmonary edema. Small effusions suspected. Electronically Signed   By: Lajean Manes M.D.   On: 03/01/2022 12:08    Scheduled Meds:  Chlorhexidine Gluconate Cloth  6 each Topical Daily   LORazepam  0.5 mg Intravenous Q4H   Continuous Infusions:   LOS: 5 days   Time spent: 50 mins   Ashtan Laton, MD [facility]   If 7PM-7AM, please contact night-coverage

## 2022-03-01 NOTE — Progress Notes (Addendum)
PT Cancellation Note  Patient Details Name: Dawn Clark MRN: [id number] DOB: [birth year]   Cancelled Treatment:    Reason Eval/Treat Not Completed: Patient not medically ready.  PT consult received.  Chart reviewed.  Nurse recommending holding PT d/t medical concerns (pt also now on amio drip).  Will hold PT at this time and monitor pt's status.  Leitha Bleak, PT 03/01/22, 1:18 PM  Addendum:  Pt now comfort care and PT order cancelled.  Leitha Bleak, PT 03/01/22, 4:33 PM

## 2022-03-01 NOTE — Progress Notes (Signed)
Cardiology Progress Note  Patient ID: Dawn Clark MRN: [id number] DOB: [birth year] Date of Encounter: 03/01/2022  Primary Cardiologist: None  Subjective   Chief Complaint: SOB/Abdominal pain  HPI: In and out of A-fib with RVR overnight.  Reports abdominal pain.  Tachypnea noted.  Labs pending.  Lactic acid ordered.  ROS:  All other ROS reviewed and negative. Pertinent positives noted in the HPI.     Inpatient Medications  Scheduled Meds:  aspirin  81 mg Oral Daily   atorvastatin  80 mg Oral Daily   Chlorhexidine Gluconate Cloth  6 each Topical Daily   clopidogrel  75 mg Oral Daily   levothyroxine  25 mcg Oral Q0600   sodium chloride flush  3 mL Intravenous Q12H   Continuous Infusions:  sodium chloride     amiodarone     amiodarone     sodium chloride     PRN Meds: sodium chloride, acetaminophen, ondansetron (ZOFRAN) IV, mouth rinse, sodium chloride flush   Vital Signs   Vitals:   03/01/22 0800 03/01/22 0900 03/01/22 0915 03/01/22 1000  BP: 100/61 113/75  98/68  Pulse: 74 78 77 77  Resp: (!) 27 (!) 30 (!) 22 15  Temp:      TempSrc:      SpO2: 97% 97% 96% 97%  Weight:      Height:        Intake/Output Summary (Last 24 hours) at 03/01/2022 1030 Last data filed at 03/01/2022 0921 Gross per 24 hour  Intake 425.64 ml  Output 601 ml  Net -175.36 ml      02/19/2022    3:00 PM 02/06/2022    9:11 AM 08/28/2020   10:28 AM  Last 3 Weights  Weight (lbs) 139 lb 12.4 oz 139 lb 11.2 oz 143 lb  Weight (kg) 63.4 kg 63.368 kg 64.864 kg      Telemetry  Overnight telemetry shows paroxysmal atrial fibrillation heart rate in the 160s, which I personally reviewed.   ECG  The most recent ECG shows sinus rhythm with persistent anterior ST elevation and reciprocal ST depressions, which I personally reviewed.   Physical Exam   Vitals:   03/01/22 0800 03/01/22 0900 03/01/22 0915 03/01/22 1000  BP: 100/61 113/75  98/68  Pulse: 74 78 77 77  Resp: (!) 27 (!) 30 (!) 22  15  Temp:      TempSrc:      SpO2: 97% 97% 96% 97%  Weight:      Height:        Intake/Output Summary (Last 24 hours) at 03/01/2022 1030 Last data filed at 03/01/2022 0921 Gross per 24 hour  Intake 425.64 ml  Output 601 ml  Net -175.36 ml       02/07/2022    3:00 PM 02/08/2022    9:11 AM 08/28/2020   10:28 AM  Last 3 Weights  Weight (lbs) 139 lb 12.4 oz 139 lb 11.2 oz 143 lb  Weight (kg) 63.4 kg 63.368 kg 64.864 kg    Body mass index is 25.56 kg/m.  General: Ill-appearing Head: Atraumatic, normal size  Eyes: PEERLA, EOMI  Neck: Supple, JVD 10 to 12 cm of water Endocrine: No thryomegaly Cardiac: Normal S1, S2; RRR; no murmurs, rubs, or gallops Lungs: Crackles bilaterally Abd: Soft, nontender, no hepatomegaly  Ext: No edema, cool extremities Musculoskeletal: No deformities, BUE and BLE strength normal and equal Skin: Warm and dry, no rashes   Neuro: Alert and oriented to person, place, time, and situation, CNII-XII  grossly intact, no focal deficits  Psych: Normal mood and affect   Labs  High Sensitivity Troponin:   Recent Labs  Lab 02/01/2022 0918 02/01/2022 1250  TROPONINIHS 11,143* >24,000*     Cardiac EnzymesNo results for input(s): "TROPONINI" in the last 168 hours. No results for input(s): "TROPIPOC" in the last 168 hours.  Chemistry Recent Labs  Lab 02/11/2022 0917 02/25/22 0343 02/26/22 0440 02/27/22 0528 02/28/22 0102  NA 141   < > 140 136 134*  K 3.7   < > 3.7 3.7 3.6  CL 108   < > 104 102 103  CO2 24   < > 25 23 20*  GLUCOSE 162*   < > 114* 126* 134*  BUN 20   < > 35* 36* 53*  CREATININE 1.02*   < > 1.21* 1.18* 1.47*  CALCIUM 8.6*   < > 8.2* 7.9* 8.0*  PROT 6.7  --   --   --   --   ALBUMIN 3.7  --   --   --   --   AST 151*  --   --   --   --   ALT 35  --   --   --   --   ALKPHOS 45  --   --   --   --   BILITOT 0.7  --   --   --   --   GFRNONAA 52*   < > 42* 44* 33*  ANIONGAP 9   < > 11 11 11    < > = values in this interval not displayed.     Hematology Recent Labs  Lab 02/27/22 0528 02/28/22 0449 03/01/22 0550  WBC 19.7* 20.5* 14.4*  RBC 4.61 4.42 4.17  HGB 14.4 14.0 12.9  HCT 43.3 41.2 39.1  MCV 93.9 93.2 93.8  MCH 31.2 31.7 30.9  MCHC 33.3 34.0 33.0  RDW 13.0 12.9 13.0  PLT 193 210 233   BNPNo results for input(s): "BNP", "PROBNP" in the last 168 hours.  DDimer No results for input(s): "DDIMER" in the last 168 hours.   Radiology  No results found.  Cardiac Studies  TTE 02/25/2022  1. Left ventricular ejection fraction, by estimation, is 25 to 30%. The  left ventricle has severely decreased function. The left ventricle  demonstrates regional wall motion abnormalities (see scoring  diagram/findings for description). Left ventricular  diastolic parameters are consistent with Grade I diastolic dysfunction  (impaired relaxation). There is severe hypokinesis of the left  ventricular, entire anteroseptal wall, anterior wall, anterolateral wall  and apical segment.   2. There is hypokinesis of the right ventricular apex with otherwise  preserved RV contraction. The right ventricular size is normal. There is  mildly elevated pulmonary artery systolic pressure.   3. The mitral valve is degenerative. Mild mitral valve regurgitation. No  evidence of mitral stenosis. Moderate mitral annular calcification.   4. Tricuspid valve regurgitation is mild to moderate.   5. The aortic valve is tricuspid. Aortic valve regurgitation is mild. No  aortic stenosis is present.   6. The inferior vena cava is normal in size with <50% respiratory  variability, suggesting right atrial pressure of 8 mmHg.   LHC 02/23/2022 Severe single-vessel coronary artery disease with thrombotic occlusion of the ostial/proximal LAD followed by sequential 30% mid and 70% mid/distal LAD stenoses.  30% ostial stenosis is also observed in OM1 branch of dominant LCx. Severely reduced LVEF with anterior hypokinesis/akinesis.  LVEF 25-30% with moderately  elevated  filling pressure (LVEDP 32 mmHg). Successful PCI to ostial/proximal LAD using Onyx Frontier 2.5 x 15 mm drug-eluting stent (postdilated to 2.8 mm) with 0% residual stenosis and TIMI-2 flow in the distal vessel, likely related to microvascular dysfunction in the setting of late presenting STEMI.  Patient Profile  Dawn Clark is a 88 year old female with history of arthritis who was admitted on 06/27/2021 with anterior STEMI.  Course complicated by systolic heart failure with EF 25-30%.  Assessment & Plan   #Anterior STEMI #Ischemic cardiomyopathy, EF 25-30% #Concerns for cardiogenic shock -Has had worsening A-fib with RVR overnight.  Chest x-ray with pulmonary edema.  Blood pressure is marginal with narrow pulse pressure. -Overall she seems to be developing low output heart failure versus cardiogenic shock.  I have ordered a stat BMP and lactic acid. -Stop beta-blocker. -I discussed her case with her son Riley Papin [phone]).  Given her advanced age she is not a candidate for mechanical support.  Palliative inotropes could be offered but this would put her through a lot.  He informed he does not want her to suffer.  I recommended DNR/DNI with no escalation of care.  He will come talk with his mother today.  For now we will continue with supportive approach. -Continue aspirin and Plavix.  Loaded with Plavix yesterday as we transition to this off of [place]. -Stop Eliquis.  Put back on heparin in case we do decide to proceed with pressors. -BMP and lactic acid are pending.  Stop beta-blocker.  I have ordered 60 mg of IV Lasix.  As long as her kidney function remains stable we will likely add digoxin later today. -Again if her condition decline she may need a central line and vasopressor support.  This will all depend on discussion with the son and patient hopefully today. -For now Ms. Minium wishes to remain full code. -Her EKG also shows persistent ST elevation.  I suspect she could  be developing an LV pseudoaneurysm.  We will check a stat echocardiogram to rule out mechanical complications of MI.  #AKI -Creatinine rising.  Repeat pending. -Suspect this is related to cardiorenal.  #Paroxysmal atrial fibrillation #Rate dependent left bundle branch block -She is in and out of A-fib.  The only option is amiodarone drip.  Continue this.  Put her back on heparin drip in case we proceed with central access depending on lab work. -No beta-blockers.  We may be able to add digoxin pending kidney function.  CRITICAL CARE Performed by: Lake Bells T O'Neal  Total critical care time: 50 minutes. Critical care time was exclusive of separately billable procedures and treating other patients. Critical care was necessary to treat or prevent imminent or life-threatening deterioration. Critical care was time spent personally by me on the following activities: development of treatment plan with patient and/or surrogate as well as nursing, discussions with consultants, evaluation of patient's response to treatment, examination of patient, obtaining history from patient or surrogate, ordering and performing treatments and interventions, ordering and review of laboratory studies, ordering and review of radiographic studies, pulse oximetry and re-evaluation of patient's condition.  For questions or updates, please contact [place] Please consult [email or web address] for contact info under   Signed, Lake Bells T. Audie Box, MD, [place]  03/01/2022 10:30 AM

## 2022-03-01 NOTE — Progress Notes (Signed)
[facility] for Electrolyte Monitoring and Replacement   Recent Labs: Potassium (mmol/L)  Date Value  02/28/2022 3.6   Magnesium (mg/dL)  Date Value  02/28/2022 2.3   Calcium (mg/dL)  Date Value  02/28/2022 8.0 (L)   Albumin (g/dL)  Date Value  03/16/2022 3.7   Sodium (mmol/L)  Date Value  02/28/2022 134 (L)    Assessment: 89YOF w/ PMH of OA, vertigo presenting with chest pain s/p STEMI / emergent DCCV, new-onset atrial fibrillation followed by cardiology. Pharmacy is asked to follow and replace electrolytes while in CCU.  Diuretics: furosemide 20 mg IV once daily - stopped.   Goal of Therapy:  Potassium 4.0 - 5.1 mmol/L Magnesium 2.0 - 2.4 mg/dL All Other Electrolytes WNL  Plan:  No new labs.  Recheck electrolytes in am  Oswald Hillock ,PharmD Clinical Pharmacist 03/01/2022 10:21 AM

## 2022-03-01 NOTE — Progress Notes (Signed)
Comfort care initiated by Dr. Dwyane Dee at approximately 1330.  Family at bedside and comfort care tray ordered.

## 2022-03-01 NOTE — Progress Notes (Signed)
At approximately 2330 heart rate sustained at 150, with brief jumps to the 160's, notified Sharion Settler NP of rate and rhythm. Received and implemented orders for amiodarone bolus and fluid bolus.

## 2022-03-01 NOTE — Progress Notes (Signed)
Per Dr. Dwyane Dee ok to discontinue po medications.

## 2022-03-01 NOTE — Progress Notes (Addendum)
Had a family discussion with Ms. Dolph's son, Nicole Kindred.  Ms. Schuff has developed what appears to be cardiogenic shock.  She has worsening heart failure symptoms with hypotension and elevated lactic acid.  Her extremities are cool.  We have given her digoxin and Lasix but we did discuss that she would need more aggressive measures to get her through this.  I discussed with her son that I do not believe she would benefit from this aggressive care at her advanced age and with frailty.  He is in agreement.  He and I have both decided that DNR/DNI is the best approach with her current care.  We will not escalate care.  We also discussed central lines and pressors.  He is also in agreement that this will likely not benefit her or significantly prolong her life.  He does not want her to suffer.  We have decided to pursue comfort measures.  I think this is the best option for her.  She is currently critically ill with an advanced cardiomyopathy and now with mechanical pump failure and cardiogenic shock.  She is unlikely to survive this in a meaningful way without very aggressive medical care which he does not believe is warranted and I do agree with.  I have reached out to the primary hospitalist Dr. Dwyane Dee to let him know our discussion.  Nursing staff has been updated.  I did offer my condolences in this difficult time.  However I believe this is the best option for her in her current state.  CRITICAL CARE Performed by: Lake Bells T O'Neal  Total critical care time: 30 minutes. Critical care time was exclusive of separately billable procedures and treating other patients. Critical care was necessary to treat or prevent imminent or life-threatening deterioration. Critical care was time spent personally by me on the following activities: development of treatment plan with patient and/or surrogate as well as nursing, discussions with consultants, evaluation of patient's response to treatment, examination of patient, obtaining  history from patient or surrogate, ordering and performing treatments and interventions, ordering and review of laboratory studies, ordering and review of radiographic studies, pulse oximetry and re-evaluation of patient's condition.  [place]ake Bells T. Audie Box, MD, [place]  03/01/2022 12:45 PM

## 2022-03-01 NOTE — Progress Notes (Signed)
Attempted to call report to receiving nurse on 1A.

## 2022-03-01 NOTE — Progress Notes (Signed)
  Echocardiogram 2D Echocardiogram with definity has been performed.  Darlina Sicilian M 03/01/2022, 11:50 AM

## 2022-03-01 NOTE — Progress Notes (Signed)
At [facility] complaining that she cannot start urine stream despite multiple attempts to, bladder scan performed, greater than 300 in bladder, in and out foley placed and drained 400 ml of clear yellow urine from bladder. Will continue to monitor.

## 2022-03-01 NOTE — Progress Notes (Signed)
Heart rate jumped to 150 numerous times during night, but did not sustain at that rate and quickly came back down to 70's. States no chest pain. Will continue to monitor

## 2022-03-01 NOTE — Progress Notes (Addendum)
At approximately 0915 patient's heart rhythm went into A-fib with RVR with a heart rate in the 150s, Dr. Dwyane Dee at bedside and ordered amiodarone bolus followed by amiodarone gtt, EKG also obtained at this time and results given to Dr. Dwyane Dee and Dr. Audie Box.  Patient complained of right shoulder pain, shortness of breath, and tightness in the chest at approximately 0950, Dr. Dwyane Dee and Dr. Audie Box notified and patient received nitroglycerin.  At 1000 Dr. Audie Box at bedside and ordered to continue amiodarone gtt, and placing orders for IV Lasix; CXR; stat BMP, lactic acid, APTT, and heparin level; stat echo; heparin gtt at 900 units; also discontinued eliquis and metoprolol po. Order had been placed for the patient to receive a bolus of normal saline, Dr. Audie Box ordered for the patient not to receive the bolus.  Lab called with a critical lactic acid level of 2.1 and Dr. Audie Box notified along with return of BMP levels.  Dr. Audie Box further ordered Digoxin po and Potassium po; Dr. Audie Box ordered tramadol for pain and does not desire for patient to receive nitroglycerine.  Dr. Audie Box contacted the son with updates; echo tech at bedside and performed echo. The patient's son and daughter-in-law are at bedside, Dr. Audie Box and Dr. Dwyane Dee notified that family is here and awaiting an update from the doctors.

## 2022-03-02 DIAGNOSIS — Z7189 Other specified counseling: Secondary | ICD-10-CM | POA: Diagnosis not present

## 2022-03-02 DIAGNOSIS — Z515 Encounter for palliative care: Secondary | ICD-10-CM

## 2022-03-02 DIAGNOSIS — I2102 ST elevation (STEMI) myocardial infarction involving left anterior descending coronary artery: Secondary | ICD-10-CM | POA: Diagnosis not present

## 2022-03-02 DIAGNOSIS — Z66 Do not resuscitate: Secondary | ICD-10-CM

## 2022-03-02 DIAGNOSIS — I4891 Unspecified atrial fibrillation: Secondary | ICD-10-CM | POA: Diagnosis not present

## 2022-03-02 MED ORDER — POLYVINYL ALCOHOL 1.4 % OP SOLN: 1.0000 [drp] | Freq: Four times a day (QID) | OPHTHALMIC | Status: DC | PRN

## 2022-03-02 MED ORDER — HALOPERIDOL LACTATE 5 MG/ML IJ SOLN
0.5000 mg | INTRAMUSCULAR | Status: DC | PRN
  Administered 2022-03-02: 0.5 mg via INTRAVENOUS
  Filled 2022-03-02: qty 1

## 2022-03-02 MED ORDER — HALOPERIDOL 0.5 MG PO TABS: 0.5000 mg | ORAL_TABLET | ORAL | Status: DC | PRN

## 2022-03-02 MED ORDER — GLYCOPYRROLATE 0.2 MG/ML IJ SOLN: 0.2000 mg | INTRAMUSCULAR | Status: DC | PRN

## 2022-03-02 MED ORDER — ACETAMINOPHEN 325 MG PO TABS: 650.0000 mg | ORAL_TABLET | Freq: Four times a day (QID) | ORAL | Status: DC | PRN

## 2022-03-02 MED ORDER — ONDANSETRON 4 MG PO TBDP: 4.0000 mg | ORAL_TABLET | Freq: Four times a day (QID) | ORAL | Status: DC | PRN

## 2022-03-02 MED ORDER — HALOPERIDOL LACTATE 2 MG/ML PO CONC: 0.5000 mg | ORAL | Status: DC | PRN

## 2022-03-02 MED ORDER — ACETAMINOPHEN 650 MG RE SUPP: 650.0000 mg | Freq: Four times a day (QID) | RECTAL | Status: DC | PRN

## 2022-03-02 MED ORDER — GLYCOPYRROLATE 1 MG PO TABS: 1.0000 mg | ORAL_TABLET | ORAL | Status: DC | PRN

## 2022-03-02 MED ORDER — BIOTENE DRY MOUTH MT LIQD: 15.0000 mL | OROMUCOSAL | Status: DC | PRN

## 2022-03-02 MED ORDER — ONDANSETRON HCL 4 MG/2ML IJ SOLN: 4.0000 mg | Freq: Four times a day (QID) | INTRAMUSCULAR | Status: DC | PRN

## 2022-03-02 MED ORDER — MORPHINE BOLUS VIA INFUSION
1.0000 mg | INTRAVENOUS | Status: DC | PRN
  Administered 2022-03-03: 1 mg via INTRAVENOUS

## 2022-03-02 NOTE — Consult Note (Signed)
Palliative Care Consult Note                                  Date: 03/02/2022   Patient Name: Dawn Clark  DOB: [birth year]  MRN: [id number]  Age / Sex: 88 y.o., female  PCP: Albina Billet, MD Referring Physician: Shawna Clamp, MD  Reason for Consultation: Establishing goals of care and Terminal Care  HPI/Patient Profile: 88 y.o. female  with past medical history of osteoporosis, vertigo, arthritis presented in the ED with complaint of chest pain and s/p fall.  Apparently the patient has been having chest pain for the previous 2 days along with shortness of breath.  She was admitted on 02/25/2022 with STEMI, new onset A-fib, acute systolic CHF/ischemic cardiomyopathy.   Apparently, neighbors were taking her to urgent care for her chest pain when she fell and lost consciousness after hitting her head for a few minutes.  In the ED EKG showed STEMI in the anterior leads.  She was emergently taken to the Cath Lab and underwent successful PCI.  Since then she has had a downward decline.  PMT was consulted for [place] conversations.  Past Medical History:  Diagnosis Date   Arthritis    Chicken pox    Degenerative arthritis of right knee 07/2020   Osteoporosis    Vertigo    x1 over 2 yrs    Subjective:   This NP Walden Field reviewed medical records, received report from team, assessed the patient and then meet at the patient's bedside to discuss diagnosis, prognosis, GOC, EOL wishes disposition and options.  I met with the patient at the bedside, who is not responding and unable to make her needs known.  Her granddaughter Dawn Clark is at the bedside.   Concept of Palliative Care was introduced as specialized medical care for people and their families living with serious illness.  If focuses on providing relief from the symptoms and stress of a serious illness.  The goal is to improve quality of life for both the patient and the family. Values and  goals of care important to patient and family were attempted to be elicited.  Created space and opportunity for patient  and family to explore thoughts and feelings regarding current medical situation   Natural trajectory and current clinical status were discussed. Questions and concerns addressed. Patient  encouraged to call with questions or concerns.    Patient/Family Understanding of Illness: She understands that her grandmother had a heart attack and the damage is irreversible.  She notes that she is now on comfort care, as of yesterday.  The patient's son was here yesterday and her granddaughter is here today to be with her.  We had further discussion on comfort care, the options, and goals of comfort care and how palliative medicine can assist as the patient approaches end-of-life.  Goals: At this time the family's goal for the patient is for her to be comfortable as she approaches end-of-life.  Today's Discussion: In addition to discussions described above we had extensive discussion on various topics.  The patient's granddaughter states that they were previously told she likely only had 24 to 48 hours.  I discussed that it is difficult to pinpoint how long patient has, and she states she understands this.  However, I told her I feel that the patient likely has hours to days.  We discussed that if the patient does continue to  linger, we could make the option for transfer to residential hospice available to allow a more peaceful environment.  However, we would cross that bridge in the coming day or two, if needed.  The patient's granddaughter states that she has appeared comfortable today.  The patient's son noted some agitation last night for which she received Ativan, which is helped a lot.  She does not feel that her grandmother has any specific needs at this time.  I offered my condolences on the situation and expressed that we are here to care for the family as well as the patient.   I provided an contact card with our team phone number as well as a copy of the book "Gone From My Sight: The Dying Experience"  I provided emotional and general support through therapeutic listening, empathy, sharing of stories, and other techniques. I answered all questions and addressed all concerns to the best of my ability.  Review of Systems  Unable to perform ROS: Patient unresponsive    Objective:   Primary Diagnoses: Present on Admission:  Acute systolic CHF (congestive heart failure) (HCC)  Atrial fibrillation with RVR ([place])   Physical Exam Vitals and nursing note reviewed.  Constitutional:      General: She is not in acute distress.    Appearance: She is ill-appearing.  HENT:     Head: Normocephalic and atraumatic.  Pulmonary:     Effort: Pulmonary effort is normal. No respiratory distress.     Comments: Breathing irregular but unlabored and no apparent distress Abdominal:     General: Abdomen is flat.  Neurological:     Mental Status: She is unresponsive.     Vital Signs:  BP 100/62 (BP Location: Right Arm)   Pulse (!) 55   Temp (!) 97.5 F (36.4 C)   Resp (!) 22   Ht _0  (1.575 m)   Wt 63.4 kg   SpO2 100%   BMI 25.56 kg/m   Palliative Assessment/Data: 10%    Advanced Care Planning:   Existing Vynca/ACP Documentation: None  Primary Decision Maker: NEXT OF KIN  Code Status/Advance Care Planning: DNR  A discussion was had today regarding advanced directives. Concepts specific to code status, artifical feeding and hydration, continued IV antibiotics and rehospitalization was had.  The difference between a aggressive medical intervention path and a palliative comfort care path for this patient at this time was had.   Decisions/Changes to ACP: None today Remain DNR, comfort care  Assessment & Plan:   Impression: 88 year old female presented with STEMI and went to Cath Lab for successful PCI.  Since then she has had a significant decline.   Yesterday she declined dramatically and was found to be in cardiogenic shock due to ischemic cardiomyopathy and acute heart failure.  At this point the medical team felt that she would not tolerate the aggressive care needed to reverse her situation and her son agreed.  The patient was made comfort care.  Palliative medicine was consulted to assist.  Today she appears comfortable and in no distress.  Additional orders will be added.  Overall prognosis grave.  SUMMARY OF RECOMMENDATIONS   Remain DNR Continue comfort care Additional symptom management orders as per below Wean oxygen to room air, may believe nasal cannula in for family comfort PMT will continue to follow for symptom management  Symptom Management:  Added Biotene 15 mL topical as needed dry mouth Added Robinul 0.2 mg IV every 4 hours as needed excessive secretions Added Haldol 0.5  mg IV every 4 hours as needed agitation or delirium Ativan 0.5 mg IV every 4 hours for anxiety Changed morphine infusion to 1 to 5 mL/h IV titrate for pain or dyspnea Added Morphine bolus via infusion 1 mg every 15 minute as needed pain or dyspnea Zofran 4 mg IV every 6 hours as needed nausea Polyvinyl alcohol 1.4% ophthalmic 1 drop OU 4 times daily as needed dry eyes  Prognosis:  Hours - Days  Discharge Planning:  Anticipated Hospital Death   Discussed with: Patient's family, nursing team, medical team, [facility] colleague    Thank you for allowing [place] to participate in the care of Miguel Aschoff PMT will continue to support holistically.  Billing based on MDM: High  Problems Addressed: One acute or chronic illness or injury that poses a threat to life or bodily function  Amount and/or Complexity of Data: Category 3:Discussion of management or test interpretation with external physician/other qualified health care professional/appropriate source (not separately reported)  Risks: Parenteral controlled substances   Signed by: Walden Field,  NP Palliative Medicine Team  Team Phone # [phone] (Nights/Weekends)  03/02/2022, 1:18 PM

## 2022-03-02 NOTE — Plan of Care (Signed)
  Problem: Health Behavior/Discharge Planning: Goal: Ability to manage health-related needs will improve Outcome: Not Met (add Reason)   Problem: Clinical Measurements: Goal: Ability to maintain clinical measurements within normal limits will improve Outcome: Not Met (add Reason)   Problem: Clinical Measurements: Goal: Will remain free from infection Outcome: Not Met (add Reason)   Problem: Clinical Measurements: Goal: Respiratory complications will improve Outcome: Not Met (add Reason)   Problem: Coping: Goal: Level of anxiety will decrease Outcome: Not Met (add Reason)   Problem: Skin Integrity: Goal: Risk for impaired skin integrity will decrease Outcome: Not Met (add Reason)

## 2022-03-02 NOTE — TOC Progression Note (Addendum)
Transition of Care [facility]) - Progression Note    Patient Details  Name: Dawn Clark MRN: [id number] Date of Birth: Oct 12, 1928  [facility] CM/SW Contact  Laurena Slimmer, RN Phone Number: 03/02/2022, 12:35 PM  Clinical Narrative:    HH previously arranged and accepted via [place]. Patient disposition has changed to comfort care per palliative team. Provided support patient's granddaughter at bedside.     Expected Discharge Plan: [place] Barriers to Discharge: Continued Medical Work up  Expected Discharge Plan and Services Expected Discharge Plan: [place]   Discharge Planning Services: CM Consult Post Acute Care Choice: [place] arrangements for the past 2 months: Single Family Home                           HH Arranged: RN, PT, OT [facility] Agency: [place] Date [place]: 02/25/22 Time [place]: 0102 Representative spoke with at [place]: [place]   Social Determinants of Health ([place]) Interventions    Readmission Risk Interventions     No data to display

## 2022-03-02 NOTE — Progress Notes (Signed)
PROGRESS NOTE    Dawn Clark  [id number] DOB: 1928/07/29 DOA: 02/06/2022  PCP: Albina Billet, MD    Brief Narrative:  This 88 years old female with PMH significant for osteoporosis, vertigo, arthritis presented in the ED with complaint of chest pain and s/p fall.  Patient describes chest pain for last 2 days mostly in both breasts and denies having any associated symptoms except shortness of breath.  Pain was persistent and nonradiating.  She has called her neighbors to take her to the urgent care for the evaluation while walking towards her car patient lost her balance and fell backwards hitting her head.  Her friend reported she has lost consciousness for few minutes.  But patient does not remember.  Patient is brought in the ED.  Twelve-lead EKG shows ST elevation in the anterior leads. Patient was emergently taken to the left heart cath, she underwent successful PCI intervention.  Patient has a downward decline.  Goals of care discussion completed by cardiologist.  Care transitioned to full comfort care.  Assessment & Plan:   Principal Problem:   ST elevation myocardial infarction (STEMI) ([place]) Active Problems:   Acute systolic CHF (congestive heart failure) (HCC)   Atrial fibrillation with RVR (HCC)   NSVT (nonsustained ventricular tachycardia) (HCC)   Ischemic cardiomyopathy   Weakness  STEMI involving LAD: Patient presented for the evaluation of chest pain and was noted to have EKG changes concerning for anterior lateral STEMI,  She was emergently taken to the left heart cath and found to have occluded ostial/proximal LAD.   Patient successfully underwent PCI with stent angioplasty. Cardiology recommended continue aspirin and Brilinta, metoprolol and high intensity statins. Heparin discontinued.  She is transitioned to Eliquis.  Brilinta discontinued and she is started on Plavix. Plan was to continue Plavix and Eliquis after discharge. Patient has a downward decline.   Goals of care discussion completed by cardiology. Care transitioned to full comfort care.  All PO meds discontinued.  Atrial fibrillation, new onset: She presented to the ED for evaluation of chest pain and was found to have new onset rapid A-fib. She is s/p cardioversion and currently in sinus rhythm.  Initiated heparin infusion, Toprol-XL 12.5 mg twice daily. She has several episodes of paroxysmal A-fib, frequent runs of nonsustained VT throughout the day.   She was started on amiodarone infusion for next 24 hours,  She continued to remain in and out of A-fib with RVR, and with low blood pressure she was started on amiodarone infusion. Cardiologist had a long discussion with son,  goals of care discussed and care transitioned to full comfort care.  Acute systolic CHF/ Ischemic cardiomyopathy: Patient presented for the evaluation of chest pain and found to have NSTEMI and A-fib, complicated by acute CHF due to her reduced LVEF. Echo shows LVEF less than 25% Lasix discontinued. Unable to titrate medications.  Goals of care discussion: Palliative care consulted.  She has worsening heart failure symptoms with hypotension. Care transitioned to full comfort care.  DNR DNI signed.  DVT prophylaxis: Eliquis Code Status: Full code Family Communication: No family at bedside Disposition Plan:   Status is: Inpatient Remains inpatient appropriate because: Admitted for NSTEMI underwent left heart cath found to have proximal LAD obstruction underwent stent angioplasty.  Tolerated well. Goals of care discussion completed by cardiologist.  Care transitioned to full comfort care.  Anticipate hospital death.  Consultants:  Cardiology  Procedures: Stent angioplasty, s/p ablation Antimicrobials: None  Subjective: Patient was seen and examined  at bedside.  Overnight events noted.  Patient lying comfortably on 2 L of supplemental oxygen. Goals of care discussion completed with the family and  care transitioned to full comfort care.  Objective: Vitals:   03/01/22 1530 03/01/22 1700 03/02/22 0700 03/02/22 0911  BP:  (!) 103/53 100/62   Pulse: 69 67 (!) 55   Resp: 10 16 (!) 22   Temp:  (!) 97.5 F (36.4 C)    TempSrc:      SpO2: (!) 89% (!) 89% (!) 77% 100%  Weight:      Height:        Intake/Output Summary (Last 24 hours) at 03/02/2022 1027 Last data filed at 03/02/2022 0800 Gross per 24 hour  Intake 28.9 ml  Output 700 ml  Net -671.1 ml   Filed Weights   02/11/2022 0911 02/14/2022 1500  Weight: 63.4 kg 63.4 kg    Examination:  General exam: Appears chronically ill looking, very deconditioned, not in any distress. Respiratory system: Bibasilar crackles, respiratory effort normal, RR 22 Cardiovascular system: S1-S2 heard, irregular rhythm, no murmur. Gastrointestinal system: Abdomen is soft, non tender, non distended, BS+ Central nervous system: Arousable, no focal neurological deficits. Extremities: No edema, no cyanosis, no clubbing Skin: No rashes, lesions or ulcers Psychiatry: Not assessed.    Data Reviewed: I have personally reviewed following labs and imaging studies  CBC: Recent Labs  Lab 02/09/2022 0917 02/25/22 0343 02/26/22 0440 02/27/22 0528 02/28/22 0449 03/01/22 0550  WBC 14.1* 23.0* 19.8* 19.7* 20.5* 14.4*  NEUTROABS 10.9*  --   --   --   --   --   HGB 16.4* 16.5* 14.9 14.4 14.0 12.9  HCT 49.6* 48.4* 44.5 43.3 41.2 39.1  MCV 96.7 91.0 93.3 93.9 93.2 93.8  PLT 238 207 183 193 210 0000000   Basic Metabolic Panel: Recent Labs  Lab 02/02/2022 0917 02/25/22 0343 02/25/22 1232 02/26/22 0440 02/27/22 0528 02/28/22 0102 03/01/22 1036  NA 141 141  --  140 136 134* 135  K 3.7 3.3* 3.7 3.7 3.7 3.6 3.3*  CL 108 103  --  104 102 103 104  CO2 24 26  --  25 23 20* 20*  GLUCOSE 162* 125*  --  114* 126* 134* 205*  BUN 20 24*  --  35* 36* 53* 54*  CREATININE 1.02* 1.14*  --  1.21* 1.18* 1.47* 1.32*  CALCIUM 8.6* 8.6*  --  8.2* 7.9* 8.0* 7.8*  MG  2.0  --  2.0 2.3 2.2 2.3  --    GFR: Estimated Creatinine Clearance: 24.3 mL/min (A) (by C-G formula based on SCr of 1.32 mg/dL (H)). Liver Function Tests: Recent Labs  Lab 02/13/2022 0917  AST 151*  ALT 35  ALKPHOS 45  BILITOT 0.7  PROT 6.7  ALBUMIN 3.7   No results for input(s): "LIPASE", "AMYLASE" in the last 168 hours. No results for input(s): "AMMONIA" in the last 168 hours. Coagulation Profile: Recent Labs  Lab 03/01/22 1104  INR 1.8*   Cardiac Enzymes: No results for input(s): "CKTOTAL", "CKMB", "CKMBINDEX", "TROPONINI" in the last 168 hours. BNP (last 3 results) No results for input(s): "PROBNP" in the last 8760 hours. HbA1C: No results for input(s): "HGBA1C" in the last 72 hours. CBG: Recent Labs  Lab 02/18/2022 0937 03/03/2022 1143  GLUCAP 142* 128*   Lipid Profile: No results for input(s): "CHOL", "HDL", "LDLCALC", "TRIG", "CHOLHDL", "LDLDIRECT" in the last 72 hours. Thyroid Function Tests: Recent Labs    03/01/22 0550  TSH 1.855  FREET4 1.34*   Anemia Panel: No results for input(s): "VITAMINB12", "FOLATE", "FERRITIN", "TIBC", "IRON", "RETICCTPCT" in the last 72 hours. Sepsis Labs: Recent Labs  Lab 03/01/22 1036  LATICACIDVEN 2.1*    Recent Results (from the past 240 hour(s))  MRSA Next Gen by PCR, Nasal     Status: None   Collection Time: 02/23/2022 11:48 AM   Specimen: Nasal Mucosa; Nasal Swab  Result Value Ref Range Status   MRSA by PCR Next Gen NOT DETECTED NOT DETECTED Final    Comment: (NOTE) The GeneXpert MRSA Assay (FDA approved for NASAL specimens only), is one component of a comprehensive MRSA colonization surveillance program. It is not intended to diagnose MRSA infection nor to guide or monitor treatment for MRSA infections. Test performance is not FDA approved in patients less than 70 years old. Performed at [facility], [address], [place], [place] [address]     Radiology Studies: ECHOCARDIOGRAM LIMITED  Result  Date: 03/01/2022    ECHOCARDIOGRAM LIMITED REPORT   Patient Name:   Dawn Clark Date of Exam: 03/01/2022 Medical Rec #:  [id number]      Height:       62.0 in Accession #:    [id number]     Weight:       139.8 lb Date of Birth:  1928-08-16      BSA:          1.642 m Patient Age:    83 years       BP:           98/66 mmHg Patient Gender: F              HR:           72 bpm. Exam Location:  Outpatient Procedure: Limited Echo, Color Doppler, Cardiac Doppler and Intracardiac            Opacification Agent Indications:     CAD Native Vessel I25.10  History:         Patient has prior history of Echocardiogram examinations, most                  recent 02/27/2022. CHF, Acute MI and CAD; Arrythmias:Atrial                  Fibrillation. S/P PCI to ostial/proximal LAD 02/06/2022.  Sonographer:     Darlina Sicilian RDCS Referring Phys:  [id number] [place] Diagnosing Phys: Eleonore Chiquito MD IMPRESSIONS  1. Severely reduced LV function, 15-20%. Entire septum into apex and apical inferior segments are akinetic, consistent with LAD infarction. No LV thrombus on contrast imaging. LVOT VTI 10.4 cm, which equates to SV of 26 cc. CO/CI estimated at 1.9 L/min and 1.2 L/min/m2. Left ventricular ejection fraction, by estimation, is 15-20%. The left ventricle has severely decreased function. The left ventricle demonstrates regional wall motion abnormalities (see scoring diagram/findings for description). Left ventricular diastolic parameters are consistent with Grade II diastolic dysfunction (pseudonormalization). Elevated left atrial pressure. The E/e' is 23.7.  2. Right ventricular systolic function is moderately reduced. The right ventricular size is normal. There is moderately elevated pulmonary artery systolic pressure.  3. The mitral valve is grossly normal. Mild mitral valve regurgitation. No evidence of mitral stenosis.  4. Tricuspid valve regurgitation is moderate to severe.  5. The aortic valve is tricuspid. Aortic valve  regurgitation is mild. No aortic stenosis is present.  6. The inferior vena cava is normal in size with greater than 50%  respiratory variability, suggesting right atrial pressure of 3 mmHg. Comparison(s): No significant change from prior study. FINDINGS  Left Ventricle: Severely reduced LV function, 15-20%. Entire septum into apex and apical inferior segments are akinetic, consistent with LAD infarction. No LV thrombus on contrast imaging. LVOT VTI 10.4 cm, which equates to SV of 26 cc. CO/CI estimated at 1.9 L/min and 1.2 L/min/m2. Left ventricular ejection fraction, by estimation, is 15-20%. The left ventricle has severely decreased function. The left ventricle demonstrates regional wall motion abnormalities. Definity contrast agent was given IV to delineate the left ventricular endocardial borders. The left ventricular internal cavity size was normal in size. There is no left ventricular hypertrophy. Left ventricular diastolic parameters are consistent with Grade II diastolic dysfunction (pseudonormalization). Elevated left atrial pressure. The E/e' is 23.7.  LV Wall Scoring: The entire anterior septum, entire apex, and mid inferoseptal segment are akinetic. Right Ventricle: The right ventricular size is normal. Right ventricular systolic function is moderately reduced. There is moderately elevated pulmonary artery systolic pressure. The tricuspid regurgitant velocity is 2.90 m/s, and with an assumed right atrial pressure of 15 mmHg, the estimated right ventricular systolic pressure is 32.1 mmHg. Pericardium: There is no evidence of pericardial effusion. Mitral Valve: The mitral valve is grossly normal. Mild mitral valve regurgitation. No evidence of mitral valve stenosis. Tricuspid Valve: The tricuspid valve is grossly normal. Tricuspid valve regurgitation is moderate to severe. No evidence of tricuspid stenosis. Aortic Valve: The aortic valve is tricuspid. Aortic valve regurgitation is mild. No aortic stenosis  is present. Pulmonic Valve: The pulmonic valve was grossly normal. Pulmonic valve regurgitation is mild to moderate. No evidence of pulmonic stenosis. Venous: The inferior vena cava is normal in size with greater than 50% respiratory variability, suggesting right atrial pressure of 3 mmHg. LEFT VENTRICLE PLAX 2D LVIDd:         5.00 cm      Diastology LVIDs:         4.20 cm      LV e' medial:    3.23 cm/s LV PW:         0.70 cm      LV E/e' medial:  23.7 LV IVS:        0.80 cm      LV e' lateral:   6.66 cm/s LVOT diam:     1.80 cm      LV E/e' lateral: 11.5 LV SV:         26 LV SV Index:   16 LVOT Area:     2.54 cm  LV Volumes (MOD) LV vol d, MOD A2C: 109.0 ml LV vol d, MOD A4C: 100.0 ml LV vol s, MOD A2C: 87.9 ml LV vol s, MOD A4C: 78.2 ml LV SV MOD A2C:     21.1 ml LV SV MOD A4C:     100.0 ml LV SV MOD BP:      23.2 ml AORTIC VALVE             PULMONIC VALVE LVOT Vmax:   52.70 cm/s  PR End Diast Vel: 10.89 msec LVOT Vmean:  37.100 cm/s LVOT VTI:    0.104 m MITRAL VALVE                  TRICUSPID VALVE MV Area (PHT): 8.25 cm       TR Peak grad:   33.6 mmHg MV Decel Time: 92 msec        TR Vmax:  290.00 cm/s MR Peak grad:    52.1 mmHg MR Mean grad:    37.0 mmHg    SHUNTS MR Vmax:         361.00 cm/s  Systemic VTI:  0.10 m MR Vmean:        291.0 cm/s   Systemic Diam: 1.80 cm MR PISA:         0.57 cm MR PISA Eff ROA: 6 mm MR PISA Radius:  0.30 cm MV E velocity: 76.50 cm/s MV A velocity: 43.50 cm/s MV E/A ratio:  1.76 Eleonore Chiquito MD Electronically signed by Eleonore Chiquito MD Signature Date/Time: 03/01/2022/4:00:31 PM    Final    DG Chest Port 1 View  Result Date: 03/01/2022 CLINICAL DATA:  Short of breath.  Chest pain. EXAM: PORTABLE CHEST 1 VIEW COMPARISON:  03/03/2022. FINDINGS: Cardiac silhouette mildly enlarged.  No mediastinal or hilar masses. Bilateral vascular congestion with interstitial thickening. Mild hazy opacities in the mid to lower lungs. Suspect small effusions. No pneumothorax. Skeletal  structures are demineralized, grossly intact. IMPRESSION: 1. Findings similar to the prior study with vascular congestion, interstitial thickening and mild cardiomegaly suggesting mild congestive heart failure with pulmonary edema. Small effusions suspected. Electronically Signed   By: Lajean Manes M.D.   On: 03/01/2022 12:08    Scheduled Meds:  LORazepam  0.5 mg Intravenous Q4H   Continuous Infusions:  morphine 1 mg/hr (03/01/22 1834)    LOS: 6 days   Time spent: 35 mins   Abbye Lao, MD [facility]   If 7PM-7AM, please contact night-coverage

## 2022-03-02 NOTE — Care Management Important Message (Signed)
Important Message  Patient Details  Name: Dawn Clark MRN: [id number] Date of Birth: [birth year]   Medicare Important Message Given:  Other (see comment)  On comfort care measures.  Medicare IM withheld at this time out of respect for patient and family.    Dannette Barbara 03/02/2022, 9:29 AM

## 2022-03-03 DIAGNOSIS — Z515 Encounter for palliative care: Secondary | ICD-10-CM | POA: Diagnosis not present

## 2022-03-03 DIAGNOSIS — Z7189 Other specified counseling: Secondary | ICD-10-CM | POA: Diagnosis not present

## 2022-03-03 DIAGNOSIS — I2102 ST elevation (STEMI) myocardial infarction involving left anterior descending coronary artery: Secondary | ICD-10-CM | POA: Diagnosis not present

## 2022-03-03 DIAGNOSIS — Z66 Do not resuscitate: Secondary | ICD-10-CM | POA: Diagnosis not present

## 2022-03-03 NOTE — Progress Notes (Signed)
  Daily Progress Note   Patient Name: Dawn Clark       Date: 03/03/2022 DOB: 1929-03-20  Age: 88 y.o. MRN#: [id number] Attending Physician: Shawna Clamp, MD Primary Care Physician: Albina Billet, MD Admit Date: 02/10/2022 Length of Stay: 7 days  Reason for Consultation/Follow-up: {Reason for Consult:23484}  HPI/Patient Profile:  ***  Subjective:   Subjective: Chart Reviewed. Updates received. Patient Assessed. Created space and opportunity for patient  and family to explore thoughts and feelings regarding current medical situation.  Today's Discussion: ***  Review of Systems  Objective:   Vital Signs:  BP 114/74 (BP Location: Right Arm)   Pulse 83   Temp 98.4 F (36.9 C) (Oral)   Resp 16   Ht 5\' 2"  (1.575 m)   Wt 63.4 kg   SpO2 96%   BMI 25.56 kg/m   Physical Exam: Physical Exam  Palliative Assessment/Data: ***    Existing Vynca/ACP Documentation: ***  Assessment & Plan:   Impression: Present on Admission:  Acute systolic CHF (congestive heart failure) (HCC)  Atrial fibrillation with RVR (HCC)  ***  SUMMARY OF RECOMMENDATIONS   ***  Symptom Management:  ***  Code Status: {Palliative Code status:23503}  Prognosis: {Palliative Care Prognosis:23504}  Discharge Planning: {Palliative dispostion:23505}  Discussed with: ***  Thank you for allowing [place] to participate in the care of Dawn Clark PMT will continue to support holistically.  Time Total: ***  Visit consisted of counseling and education dealing with the complex and emotionally intense issues of symptom management and palliative care in the setting of serious and potentially life-threatening illness. Greater than 50%  of this time was spent counseling and coordinating care related to the above assessment and plan.  Walden Field, NP Palliative Medicine Team  Team Phone # [phone] (Nights/Weekends)  12/31/2020, 8:17 AM

## 2022-03-03 NOTE — Progress Notes (Signed)
PROGRESS NOTE    Dawn Clark  TOI:[id number] DOB: Jan 19, 1929 DOA: 02/16/2022  PCP: Albina Billet, MD    Brief Narrative:  This 88 years old female with PMH significant for osteoporosis, vertigo, arthritis presented in the ED with complaint of chest pain and s/p fall.  Patient describes chest pain for last 2 days mostly in both breasts and denies having any associated symptoms except shortness of breath.  Pain was persistent and nonradiating.  She has called her neighbors to take her to the urgent care for the evaluation while walking towards her car patient lost her balance and fell backwards hitting her head.  Her friend reported she has lost consciousness for few minutes.  But patient does not remember.  Patient is brought in the ED.  Twelve-lead EKG shows ST elevation in the anterior leads. Patient was emergently taken to the left heart cath, she underwent successful PCI intervention.  Patient has a downward decline.  Goals of care discussion completed by cardiologist.  Care transitioned to full comfort care.  Assessment & Plan:   Principal Problem:   ST elevation myocardial infarction (STEMI) ([place]) Active Problems:   Acute systolic CHF (congestive heart failure) ([facility])   Atrial fibrillation with RVR ([facility])   NSVT (nonsustained ventricular tachycardia) ([facility])   Ischemic cardiomyopathy   Weakness  STEMI involving LAD: Patient presented for the evaluation of chest pain and was noted to have EKG changes concerning for anterior lateral STEMI,  She was emergently taken to the left heart cath and found to have occluded ostial/proximal LAD.   Patient successfully underwent PCI with stent angioplasty. Cardiology recommended continue aspirin and Brilinta, metoprolol and high intensity statins. Plan was to continue Plavix and Eliquis after discharge. Patient has a downward decline.  Goals of care discussion completed by cardiology. Care transitioned to full comfort care.  All PO meds  discontinued.  Atrial fibrillation, new onset: She presented to the ED for evaluation of chest pain and was found to have new onset rapid A-fib. She is s/p cardioversion and currently in sinus rhythm.  Initiated heparin infusion, Toprol-XL 12.5 mg twice daily. She has several episodes of paroxysmal A-fib, frequent runs of nonsustained VT throughout the day.   She was started on amiodarone infusion for next 24 hours,  Repeat Echo showed severely reduced LVEF 15-20% Cardiologist had a long discussion with son,  goals of care discussed and care transitioned to full comfort care.  Acute systolic CHF/ Ischemic cardiomyopathy: Patient presented for the evaluation of chest pain and found to have NSTEMI and A-fib, complicated by acute CHF due to her reduced LVEF. Echo shows LVEF 15-20% Lasix discontinued. Unable to titrate medications.  Goals of care discussion: Palliative care consulted.  She has worsening heart failure symptoms with hypotension. Care transitioned to full comfort care.  DNR DNI signed.  DVT prophylaxis: Eliquis Code Status: Full code Family Communication: spoke with son at bed side. Disposition Plan:   Status is: Inpatient Remains inpatient appropriate because: Admitted for NSTEMI underwent left heart cath found to have proximal LAD obstruction underwent stent angioplasty.  Tolerated well. Goals of care discussion completed by cardiologist.  Care transitioned to full comfort care.  Anticipate hospital death or transition to residential hospice.  Consultants:  Cardiology  Procedures: Stent angioplasty, s/p ablation Antimicrobials: None  Subjective: Patient was seen and examined at bedside.  Overnight events noted.  Patient is lying comfortably on room air.  She remains pain-free. Goals of care discussion completed with the family and  care transitioned to full comfort care.  Objective: Vitals:   03/01/22 1700 03/02/22 0700 03/02/22 0911 03/03/22 0740  BP: (!)  103/53 100/62  114/74  Pulse: 67 (!) 55  83  Resp: 16 (!) 22  16  Temp: (!) 97.5 F (36.4 C)   98.4 F (36.9 C)  TempSrc:    Oral  SpO2: (!) 89% (!) 77% 100% 96%  Weight:      Height:        Intake/Output Summary (Last 24 hours) at 03/03/2022 1109 Last data filed at 03/03/2022 1047 Gross per 24 hour  Intake 27.44 ml  Output --  Net 27.44 ml   Filed Weights   02/05/2022 0911 02/23/2022 1500  Weight: 63.4 kg 63.4 kg    Examination:  General exam: Appears chronically ill looking, very deconditioned, not in any distress. Respiratory system: Bibasilar crackles, respiratory effort normal, RR 16 Cardiovascular system: S1-S2 heard, irregular rhythm, no murmur. Gastrointestinal system: Abdomen is soft, non tender, non distended, BS+ Central nervous system: Arousable, no focal neurological deficits. Extremities: No edema, no cyanosis, no clubbing Skin: No rashes, lesions or ulcers Psychiatry: Not assessed.    Data Reviewed: I have personally reviewed following labs and imaging studies  CBC: Recent Labs  Lab 02/25/22 0343 02/26/22 0440 02/27/22 0528 02/28/22 0449 03/01/22 0550  WBC 23.0* 19.8* 19.7* 20.5* 14.4*  HGB 16.5* 14.9 14.4 14.0 12.9  HCT 48.4* 44.5 43.3 41.2 39.1  MCV 91.0 93.3 93.9 93.2 93.8  PLT 207 183 193 210 233   Basic Metabolic Panel: Recent Labs  Lab 02/25/22 0343 02/25/22 1232 02/26/22 0440 02/27/22 0528 02/28/22 0102 03/01/22 1036  NA 141  --  140 136 134* 135  K 3.3* 3.7 3.7 3.7 3.6 3.3*  CL 103  --  104 102 103 104  CO2 26  --  25 23 20* 20*  GLUCOSE 125*  --  114* 126* 134* 205*  BUN 24*  --  35* 36* 53* 54*  CREATININE 1.14*  --  1.21* 1.18* 1.47* 1.32*  CALCIUM 8.6*  --  8.2* 7.9* 8.0* 7.8*  MG  --  2.0 2.3 2.2 2.3  --    GFR: Estimated Creatinine Clearance: 24.3 mL/min (A) (by C-G formula based on SCr of 1.32 mg/dL (H)). Liver Function Tests: No results for input(s): "AST", "ALT", "ALKPHOS", "BILITOT", "PROT", "ALBUMIN" in the last  168 hours.  No results for input(s): "LIPASE", "AMYLASE" in the last 168 hours. No results for input(s): "AMMONIA" in the last 168 hours. Coagulation Profile: Recent Labs  Lab 03/01/22 1104  INR 1.8*   Cardiac Enzymes: No results for input(s): "CKTOTAL", "CKMB", "CKMBINDEX", "TROPONINI" in the last 168 hours. BNP (last 3 results) No results for input(s): "PROBNP" in the last 8760 hours. HbA1C: No results for input(s): "HGBA1C" in the last 72 hours. CBG: Recent Labs  Lab 02/26/2022 1143  GLUCAP 128*   Lipid Profile: No results for input(s): "CHOL", "HDL", "LDLCALC", "TRIG", "CHOLHDL", "LDLDIRECT" in the last 72 hours. Thyroid Function Tests: Recent Labs    03/01/22 0550  TSH 1.855  FREET4 1.34*   Anemia Panel: No results for input(s): "VITAMINB12", "FOLATE", "FERRITIN", "TIBC", "IRON", "RETICCTPCT" in the last 72 hours. Sepsis Labs: Recent Labs  Lab 03/01/22 1036  LATICACIDVEN 2.1*    Recent Results (from the past 240 hour(s))  MRSA Next Gen by PCR, Nasal     Status: None   Collection Time: 02/25/2022 11:48 AM   Specimen: Nasal Mucosa; Nasal Swab  Result Value  Ref Range Status   MRSA by PCR Next Gen NOT DETECTED NOT DETECTED Final    Comment: (NOTE) The GeneXpert MRSA Assay (FDA approved for NASAL specimens only), is one component of a comprehensive MRSA colonization surveillance program. It is not intended to diagnose MRSA infection nor to guide or monitor treatment for MRSA infections. Test performance is not FDA approved in patients less than 88 years old. Performed at [facility], [address], [place], [place] [address]     Radiology Studies: ECHOCARDIOGRAM LIMITED  Result Date: 03/01/2022    ECHOCARDIOGRAM LIMITED REPORT   Patient Name:   Dawn Clark Date of Exam: 03/01/2022 Medical Rec #:  [id number]      Height:       62.0 in Accession #:    [id number]     Weight:       139.8 lb Date of Birth:  14-Aug-1928      BSA:          1.642 m Patient  Age:    89 years       BP:           98/66 mmHg Patient Gender: F              HR:           72 bpm. Exam Location:  Outpatient Procedure: Limited Echo, Color Doppler, Cardiac Doppler and Intracardiac            Opacification Agent Indications:     CAD Native Vessel I25.10  History:         Patient has prior history of Echocardiogram examinations, most                  recent 02/14/2022. CHF, Acute MI and CAD; Arrythmias:Atrial                  Fibrillation. S/P PCI to ostial/proximal LAD 02/21/2022.  Sonographer:     Leta Jungling RDCS Referring Phys:  [id number] Ronnald Ramp O'NEAL Diagnosing Phys: Lennie Odor MD IMPRESSIONS  1. Severely reduced LV function, 15-20%. Entire septum into apex and apical inferior segments are akinetic, consistent with LAD infarction. No LV thrombus on contrast imaging. LVOT VTI 10.4 cm, which equates to SV of 26 cc. CO/CI estimated at 1.9 L/min and 1.2 L/min/m2. Left ventricular ejection fraction, by estimation, is 15-20%. The left ventricle has severely decreased function. The left ventricle demonstrates regional wall motion abnormalities (see scoring diagram/findings for description). Left ventricular diastolic parameters are consistent with Grade II diastolic dysfunction (pseudonormalization). Elevated left atrial pressure. The E/e' is 23.7.  2. Right ventricular systolic function is moderately reduced. The right ventricular size is normal. There is moderately elevated pulmonary artery systolic pressure.  3. The mitral valve is grossly normal. Mild mitral valve regurgitation. No evidence of mitral stenosis.  4. Tricuspid valve regurgitation is moderate to severe.  5. The aortic valve is tricuspid. Aortic valve regurgitation is mild. No aortic stenosis is present.  6. The inferior vena cava is normal in size with greater than 50% respiratory variability, suggesting right atrial pressure of 3 mmHg. Comparison(s): No significant change from prior study. FINDINGS  Left Ventricle:  Severely reduced LV function, 15-20%. Entire septum into apex and apical inferior segments are akinetic, consistent with LAD infarction. No LV thrombus on contrast imaging. LVOT VTI 10.4 cm, which equates to SV of 26 cc. CO/CI estimated at 1.9 L/min and 1.2 L/min/m2. Left ventricular ejection fraction, by estimation, is 15-20%.  The left ventricle has severely decreased function. The left ventricle demonstrates regional wall motion abnormalities. Definity contrast agent was given IV to delineate the left ventricular endocardial borders. The left ventricular internal cavity size was normal in size. There is no left ventricular hypertrophy. Left ventricular diastolic parameters are consistent with Grade II diastolic dysfunction (pseudonormalization). Elevated left atrial pressure. The E/e' is 23.7.  LV Wall Scoring: The entire anterior septum, entire apex, and mid inferoseptal segment are akinetic. Right Ventricle: The right ventricular size is normal. Right ventricular systolic function is moderately reduced. There is moderately elevated pulmonary artery systolic pressure. The tricuspid regurgitant velocity is 2.90 m/s, and with an assumed right atrial pressure of 15 mmHg, the estimated right ventricular systolic pressure is 48.6 mmHg. Pericardium: There is no evidence of pericardial effusion. Mitral Valve: The mitral valve is grossly normal. Mild mitral valve regurgitation. No evidence of mitral valve stenosis. Tricuspid Valve: The tricuspid valve is grossly normal. Tricuspid valve regurgitation is moderate to severe. No evidence of tricuspid stenosis. Aortic Valve: The aortic valve is tricuspid. Aortic valve regurgitation is mild. No aortic stenosis is present. Pulmonic Valve: The pulmonic valve was grossly normal. Pulmonic valve regurgitation is mild to moderate. No evidence of pulmonic stenosis. Venous: The inferior vena cava is normal in size with greater than 50% respiratory variability, suggesting right atrial  pressure of 3 mmHg. LEFT VENTRICLE PLAX 2D LVIDd:         5.00 cm      Diastology LVIDs:         4.20 cm      LV e' medial:    3.23 cm/s LV PW:         0.70 cm      LV E/e' medial:  23.7 LV IVS:        0.80 cm      LV e' lateral:   6.66 cm/s LVOT diam:     1.80 cm      LV E/e' lateral: 11.5 LV SV:         26 LV SV Index:   16 LVOT Area:     2.54 cm  LV Volumes (MOD) LV vol d, MOD A2C: 109.0 ml LV vol d, MOD A4C: 100.0 ml LV vol s, MOD A2C: 87.9 ml LV vol s, MOD A4C: 78.2 ml LV SV MOD A2C:     21.1 ml LV SV MOD A4C:     100.0 ml LV SV MOD BP:      23.2 ml AORTIC VALVE             PULMONIC VALVE LVOT Vmax:   52.70 cm/s  PR End Diast Vel: 10.89 msec LVOT Vmean:  37.100 cm/s LVOT VTI:    0.104 m MITRAL VALVE                  TRICUSPID VALVE MV Area (PHT): 8.25 cm       TR Peak grad:   33.6 mmHg MV Decel Time: 92 msec        TR Vmax:        290.00 cm/s MR Peak grad:    52.1 mmHg MR Mean grad:    37.0 mmHg    SHUNTS MR Vmax:         361.00 cm/s  Systemic VTI:  0.10 m MR Vmean:        291.0 cm/s   Systemic Diam: 1.80 cm MR PISA:         0.57 cm  MR PISA Eff ROA: 6 mm MR PISA Radius:  0.30 cm MV E velocity: 76.50 cm/s MV A velocity: 43.50 cm/s MV E/A ratio:  1.76 Lennie Odor MD Electronically signed by Lennie Odor MD Signature Date/Time: 03/01/2022/4:00:31 PM    Final     Scheduled Meds:  LORazepam  0.5 mg Intravenous Q4H   Continuous Infusions:  morphine 1 mg/hr (03/03/22 0730)    LOS: 7 days   Time spent: 35 mins   Allison Silva, MD [facility]   If 7PM-7AM, please contact night-coverage

## 2022-03-03 NOTE — Plan of Care (Signed)
  Problem: Education: Goal: Knowledge of General Education information will improve Description: Including pain rating scale, medication(s)/side effects and non-pharmacologic comfort measures Outcome: Progressing   Problem: Health Behavior/Discharge Planning: Goal: Ability to manage health-related needs will improve Outcome: Progressing   Problem: Clinical Measurements: Goal: Ability to maintain clinical measurements within normal limits will improve Outcome: Progressing Goal: Will remain free from infection Outcome: Progressing Goal: Diagnostic test results will improve Outcome: Not Progressing Goal: Respiratory complications will improve Outcome: Not Progressing Goal: Cardiovascular complication will be avoided Outcome: Not Progressing   Problem: Activity: Goal: Risk for activity intolerance will decrease Outcome: Not Progressing   Problem: Nutrition: Goal: Adequate nutrition will be maintained Outcome: Not Progressing   Problem: Coping: Goal: Level of anxiety will decrease Outcome: Progressing   Problem: Elimination: Goal: Will not experience complications related to bowel motility Outcome: Not Progressing Goal: Will not experience complications related to urinary retention Outcome: Not Progressing   Problem: Pain Managment: Goal: General experience of comfort will improve Outcome: Not Progressing   Problem: Safety: Goal: Ability to remain free from injury will improve Outcome: Progressing   Problem: Skin Integrity: Goal: Risk for impaired skin integrity will decrease Outcome: Not Progressing   Problem: Education: Goal: Understanding of CV disease, CV risk reduction, and recovery process will improve Outcome: Not Progressing Goal: Individualized Educational Video(s) Outcome: Not Progressing   Problem: Activity: Goal: Ability to return to baseline activity level will improve Outcome: Not Progressing   Problem: Cardiovascular: Goal: Ability to achieve  and maintain adequate cardiovascular perfusion will improve Outcome: Not Applicable Goal: Vascular access site(s) Level 0-1 will be maintained Outcome: Progressing   Problem: Health Behavior/Discharge Planning: Goal: Ability to safely manage health-related needs after discharge will improve Outcome: Not Applicable   Problem: Education: Goal: Knowledge of cardiac device and self-care will improve Outcome: Not Applicable Goal: Ability to safely manage health related needs after discharge will improve Outcome: Not Applicable Goal: Individualized Educational Video(s) Outcome: Not Applicable   Problem: Cardiac: Goal: Ability to achieve and maintain adequate cardiopulmonary perfusion will improve Outcome: Not Applicable   Problem: Education: Goal: Knowledge of the prescribed therapeutic regimen will improve Outcome: Not Applicable   Problem: Coping: Goal: Ability to identify and develop effective coping behavior will improve Outcome: Not Applicable   Problem: Clinical Measurements: Goal: Quality of life will improve Outcome: Not Applicable   Problem: Role Relationship: Goal: Family's ability to cope with current situation will improve Outcome: Not Applicable Goal: Ability to verbalize concerns, feelings, and thoughts to partner or family member will improve Outcome: Not Applicable   Problem: Pain Management: Goal: Satisfaction with pain management regimen will improve Outcome: Progressing

## 2022-03-04 ENCOUNTER — Ambulatory Visit: Payer: Medicare HMO | Admitting: Family

## 2022-03-04 DEATH — deceased

## 2022-04-03 NOTE — Progress Notes (Signed)
   2022/03/16 0421  Attending [place]  Attending Physician Notified Y  Attending Physician (First and Last Name) Neomia Glass, DNP  Post Mortem Checklist  Date of Death 2022-03-16  Time of Death 0405  Pronounced By Rexford Maus, RN and Jeralene Peters, RN  Next of kin notified Yes  Name of next of kin notified of death Rosaria Kubin  Contact Person's Relationship to Patient Daughter-in-law  Contact Person's Phone Number [phone]  Was the patient a No Code Blue or a Limited Code Blue? Yes  Did the patient die unattended? Yes  Patient restrained? 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 N  Patient and [place] Returned  Patient is satisfied that all belongings have been returned? Not applicable  Dermatherapy linen/gowns NOT sent with patient or transporter Not applicable  Notifications  Patient Placement notified that Post Mortem checklist is complete Yes  Medical Examiner  Is this a medical examiner's case? [place] home name/address/phone # [place]  Planned location of pickup [place]

## 2022-04-03 NOTE — Plan of Care (Signed)
  Problem: Education: Goal: Knowledge of General Education information will improve Description: Including pain rating scale, medication(s)/side effects and non-pharmacologic comfort measures Outcome: Progressing   Problem: Health Behavior/Discharge Planning: Goal: Ability to manage health-related needs will improve Outcome: Progressing   Problem: Clinical Measurements: Goal: Ability to maintain clinical measurements within normal limits will improve Outcome: Not Progressing Goal: Will remain free from infection Outcome: Progressing Goal: Diagnostic test results will improve Outcome: Not Progressing Goal: Respiratory complications will improve Outcome: Not Progressing Goal: Cardiovascular complication will be avoided Outcome: Not Progressing   Problem: Nutrition: Goal: Adequate nutrition will be maintained Outcome: Not Progressing   Problem: Coping: Goal: Level of anxiety will decrease Outcome: Progressing   Problem: Elimination: Goal: Will not experience complications related to bowel motility Outcome: Not Progressing Goal: Will not experience complications related to urinary retention Outcome: Not Progressing   Problem: Pain Managment: Goal: General experience of comfort will improve Outcome: Progressing   Problem: Safety: Goal: Ability to remain free from injury will improve Outcome: Progressing   Problem: Skin Integrity: Goal: Risk for impaired skin integrity will decrease Outcome: Not Progressing   Problem: Education: Goal: Understanding of CV disease, CV risk reduction, and recovery process will improve Outcome: Not Progressing Goal: Individualized Educational Video(s) Outcome: Not Progressing   Problem: Activity: Goal: Ability to return to baseline activity level will improve Outcome: Not Progressing   Problem: Cardiovascular: Goal: Vascular access site(s) Level 0-1 will be maintained Outcome: Not Progressing   Problem: Pain Management: Goal:  Satisfaction with pain management regimen will improve Outcome: Progressing

## 2022-04-03 NOTE — Discharge Summary (Signed)
Physician Discharge Summary   Patient: Dawn Clark MRN: [id number] DOB: 19-Jul-1928  Admit date:     02/05/2022  Discharge date: 03-15-2022  Discharge Physician: Elgie Collard Anntoinette Haefele   PCP: Jaclyn Shaggy, MD   Recommendations at discharge:      Discharge Diagnoses: Principal Problem:   ST elevation myocardial infarction (STEMI) [facility]) Active Problems:   Acute systolic CHF (congestive heart failure) (HCC)   Atrial fibrillation with RVR (HCC)   NSVT (nonsustained ventricular tachycardia) (HCC)   Ischemic cardiomyopathy   Weakness  Resolved Problems:   * No resolved hospital problems. *  Hospital Course:  88 years old female with PMH significant for osteoporosis, vertigo, arthritis presented in the ED with complaint of chest pain and s/p fall.  Patient describes chest pain for last 2 days mostly in both breasts and denies having any associated symptoms except shortness of breath.  Pain was persistent and nonradiating.  She has called her neighbors to take her to the urgent care for the evaluation while walking towards her car patient lost her balance and fell backwards hitting her head.  Her friend reported she has lost consciousness for few minutes.  But patient does not remember.  Patient is brought in the ED.  Twelve-lead EKG shows ST elevation in the anterior leads. Patient was emergently taken to the left heart cath, she underwent successful PCI intervention.  Patient has a downward decline.  Goals of care discussion completed by cardiologist.  Care transitioned to full comfort care.    patient expired on Mar 15, 2022 at 0405           Consultants:  Procedures performed:   Disposition:  Diet recommendation:   DISCHARGE MEDICATION: Allergies as of 03/15/2022   No Known Allergies      Medication List     ASK your doctor about these medications    acetaminophen 650 MG CR tablet Commonly known as: TYLENOL Take 650 mg by mouth every 8 (eight) hours as needed for pain.    alendronate 70 MG tablet Commonly known as: FOSAMAX Take 70 mg by mouth every Monday. In the morning.Take with a full glass of water on an empty stomach.   amoxicillin 500 MG capsule Commonly known as: AMOXIL Take 2,000 mg by mouth once. 1 hour prior to dental appts   CALCIUM 600+D3 PO Take 1 tablet by mouth daily.   Centrum Silver tablet Take 1 tablet by mouth daily.        Discharge Exam: Filed Weights   02/25/2022 0911 02/16/2022 1500  Weight: 63.4 kg 63.4 kg     Condition at discharge:   The results of significant diagnostics from this hospitalization (including imaging, microbiology, ancillary and laboratory) are listed below for reference.   Imaging Studies: ECHOCARDIOGRAM LIMITED  Result Date: 03/01/2022    ECHOCARDIOGRAM LIMITED REPORT   Patient Name:   Dawn Clark Date of Exam: 03/01/2022 Medical Rec #:  [id number]      Height:       62.0 in Accession #:    [id number]     Weight:       139.8 lb Date of Birth:  1929/04/15      BSA:          1.642 m Patient Age:    88 years       BP:           98/66 mmHg Patient Gender: F  HR:           72 bpm. Exam Location:  Outpatient Procedure: Limited Echo, Color Doppler, Cardiac Doppler and Intracardiac            Opacification Agent Indications:     CAD Native Vessel I25.10  History:         Patient has prior history of Echocardiogram examinations, most                  recent 03/26/22. CHF, Acute MI and CAD; Arrythmias:Atrial                  Fibrillation. S/P PCI to ostial/proximal LAD 03-26-22.  Sonographer:     Leta Jungling RDCS Referring Phys:  [id number] Ronnald Ramp O'NEAL Diagnosing Phys: Lennie Odor MD IMPRESSIONS  1. Severely reduced LV function, 15-20%. Entire septum into apex and apical inferior segments are akinetic, consistent with LAD infarction. No LV thrombus on contrast imaging. LVOT VTI 10.4 cm, which equates to SV of 26 cc. CO/CI estimated at 1.9 L/min and 1.2 L/min/m2. Left ventricular ejection  fraction, by estimation, is 15-20%. The left ventricle has severely decreased function. The left ventricle demonstrates regional wall motion abnormalities (see scoring diagram/findings for description). Left ventricular diastolic parameters are consistent with Grade II diastolic dysfunction (pseudonormalization). Elevated left atrial pressure. The E/e' is 23.7.  2. Right ventricular systolic function is moderately reduced. The right ventricular size is normal. There is moderately elevated pulmonary artery systolic pressure.  3. The mitral valve is grossly normal. Mild mitral valve regurgitation. No evidence of mitral stenosis.  4. Tricuspid valve regurgitation is moderate to severe.  5. The aortic valve is tricuspid. Aortic valve regurgitation is mild. No aortic stenosis is present.  6. The inferior vena cava is normal in size with greater than 50% respiratory variability, suggesting right atrial pressure of 3 mmHg. Comparison(s): No significant change from prior study. FINDINGS  Left Ventricle: Severely reduced LV function, 15-20%. Entire septum into apex and apical inferior segments are akinetic, consistent with LAD infarction. No LV thrombus on contrast imaging. LVOT VTI 10.4 cm, which equates to SV of 26 cc. CO/CI estimated at 1.9 L/min and 1.2 L/min/m2. Left ventricular ejection fraction, by estimation, is 15-20%. The left ventricle has severely decreased function. The left ventricle demonstrates regional wall motion abnormalities. Definity contrast agent was given IV to delineate the left ventricular endocardial borders. The left ventricular internal cavity size was normal in size. There is no left ventricular hypertrophy. Left ventricular diastolic parameters are consistent with Grade II diastolic dysfunction (pseudonormalization). Elevated left atrial pressure. The E/e' is 23.7.  LV Wall Scoring: The entire anterior septum, entire apex, and mid inferoseptal segment are akinetic. Right Ventricle: The right  ventricular size is normal. Right ventricular systolic function is moderately reduced. There is moderately elevated pulmonary artery systolic pressure. The tricuspid regurgitant velocity is 2.90 m/s, and with an assumed right atrial pressure of 15 mmHg, the estimated right ventricular systolic pressure is 48.6 mmHg. Pericardium: There is no evidence of pericardial effusion. Mitral Valve: The mitral valve is grossly normal. Mild mitral valve regurgitation. No evidence of mitral valve stenosis. Tricuspid Valve: The tricuspid valve is grossly normal. Tricuspid valve regurgitation is moderate to severe. No evidence of tricuspid stenosis. Aortic Valve: The aortic valve is tricuspid. Aortic valve regurgitation is mild. No aortic stenosis is present. Pulmonic Valve: The pulmonic valve was grossly normal. Pulmonic valve regurgitation is mild to moderate. No evidence of pulmonic stenosis. Venous: The inferior  vena cava is normal in size with greater than 50% respiratory variability, suggesting right atrial pressure of 3 mmHg. LEFT VENTRICLE PLAX 2D LVIDd:         5.00 cm      Diastology LVIDs:         4.20 cm      LV e' medial:    3.23 cm/s LV PW:         0.70 cm      LV E/e' medial:  23.7 LV IVS:        0.80 cm      LV e' lateral:   6.66 cm/s LVOT diam:     1.80 cm      LV E/e' lateral: 11.5 LV SV:         26 LV SV Index:   16 LVOT Area:     2.54 cm  LV Volumes (MOD) LV vol d, MOD A2C: 109.0 ml LV vol d, MOD A4C: 100.0 ml LV vol s, MOD A2C: 87.9 ml LV vol s, MOD A4C: 78.2 ml LV SV MOD A2C:     21.1 ml LV SV MOD A4C:     100.0 ml LV SV MOD BP:      23.2 ml AORTIC VALVE             PULMONIC VALVE LVOT Vmax:   52.70 cm/s  PR End Diast Vel: 10.89 msec LVOT Vmean:  37.100 cm/s LVOT VTI:    0.104 m MITRAL VALVE                  TRICUSPID VALVE MV Area (PHT): 8.25 cm       TR Peak grad:   33.6 mmHg MV Decel Time: 92 msec        TR Vmax:        290.00 cm/s MR Peak grad:    52.1 mmHg MR Mean grad:    37.0 mmHg    SHUNTS MR Vmax:          361.00 cm/s  Systemic VTI:  0.10 m MR Vmean:        291.0 cm/s   Systemic Diam: 1.80 cm MR PISA:         0.57 cm MR PISA Eff ROA: 6 mm MR PISA Radius:  0.30 cm MV E velocity: 76.50 cm/s MV A velocity: 43.50 cm/s MV E/A ratio:  1.76 Lennie Odor MD Electronically signed by Lennie Odor MD Signature Date/Time: 03/01/2022/4:00:31 PM    Final    DG Chest Port 1 View  Result Date: 03/01/2022 CLINICAL DATA:  Short of breath.  Chest pain. EXAM: PORTABLE CHEST 1 VIEW COMPARISON:  March 14, 2022. FINDINGS: Cardiac silhouette mildly enlarged.  No mediastinal or hilar masses. Bilateral vascular congestion with interstitial thickening. Mild hazy opacities in the mid to lower lungs. Suspect small effusions. No pneumothorax. Skeletal structures are demineralized, grossly intact. IMPRESSION: 1. Findings similar to the prior study with vascular congestion, interstitial thickening and mild cardiomegaly suggesting mild congestive heart failure with pulmonary edema. Small effusions suspected. Electronically Signed   By: Amie Portland M.D.   On: 03/01/2022 12:08   ECHOCARDIOGRAM LIMITED  Result Date: 02/25/2022    ECHOCARDIOGRAM LIMITED REPORT   Patient Name:   BEONCA GIBB Date of Exam: 02/25/2022 Medical Rec #:  [id number]      Height:       62.0 in Accession #:    [id number]     Weight:       139.8  lb Date of Birth:  03-Sep-1928      BSA:          1.642 m Patient Age:    88 years       BP:           90/67 mmHg Patient Gender: F              HR:           96 bpm. Exam Location:  [facility] Procedure: Limited Echo, Cardiac Doppler, Color Doppler and Intracardiac            Opacification Agent Indications:     Acute myocardial Infarction --unspecified I21.9  History:         Patient has prior history of Echocardiogram examinations, most                  recent 02/18/2022. Arrythmias:Atrial Fibrillation. STEMI.  Sonographer:     Cristela Blue Referring Phys:  [id number] CHRISTOPHER END Diagnosing Phys: Debbe Odea MD IMPRESSIONS   1. No LV thrombus noted. Left ventricular ejection fraction, by estimation, is 25 to 30%. The left ventricle has severely decreased function.  2. Right ventricular systolic function is normal.  3. Left atrial size was severely dilated.  4. The mitral valve is normal in structure. Mild mitral valve regurgitation.  5. Tricuspid valve regurgitation is mild to moderate. FINDINGS  Left Ventricle: No LV thrombus noted. Left ventricular ejection fraction, by estimation, is 25 to 30%. The left ventricle has severely decreased function. Definity contrast agent was given IV to delineate the left ventricular endocardial borders. Right Ventricle: Right ventricular systolic function is normal. Left Atrium: Left atrial size was severely dilated. Right Atrium: Right atrial size was normal in size. Mitral Valve: The mitral valve is normal in structure. Mild mitral valve regurgitation. Tricuspid Valve: The tricuspid valve is normal in structure. Tricuspid valve regurgitation is mild to moderate. LEFT VENTRICLE PLAX 2D LVIDd:         4.30 cm LVIDs:         3.80 cm LV PW:         0.80 cm LV IVS:        0.60 cm LVOT diam:     1.90 cm LVOT Area:     2.84 cm  LV Volumes (MOD) LV vol d, MOD A2C: 31.2 ml LV vol d, MOD A4C: 56.4 ml LV vol s, MOD A2C: 16.4 ml LV vol s, MOD A4C: 45.4 ml LV SV MOD A2C:     14.8 ml LV SV MOD A4C:     56.4 ml LV SV MOD BP:      13.3 ml LEFT ATRIUM              Index        RIGHT ATRIUM          Index LA diam:        3.10 cm  1.89 cm/m   RA Area:     9.38 cm LA Vol (A2C):   129.0 ml 78.58 ml/m  RA Volume:   17.90 ml 10.90 ml/m LA Vol (A4C):   83.8 ml  51.05 ml/m LA Biplane Vol: 111.0 ml 67.61 ml/m   AORTA Ao Root diam: 2.60 cm TRICUSPID VALVE TR Peak grad:   29.8 mmHg TR Vmax:        273.00 cm/s  SHUNTS Systemic Diam: 1.90 cm Debbe Odea MD Electronically signed by Debbe Odea MD Signature Date/Time: 02/25/2022/1:11:14 PM  Final    ECHOCARDIOGRAM COMPLETE  Result Date: 02/25/2022     ECHOCARDIOGRAM REPORT   Patient Name:   Ledell PeoplesRUBY LEA Stills Date of Exam: 02/08/2022 Medical Rec #:  [id number]      Height:       62.0 in Accession #:    [id number]     Weight:       139.7 lb Date of Birth:  02/08/1929      BSA:          1.641 m Patient Age:    88 years       BP:           102/70 mmHg Patient Gender: F              HR:           82 bpm. Exam Location:  [facility] Procedure: 2D Echo, Cardiac Doppler, Color Doppler and Intracardiac            Opacification Agent Indications:     Acute myocardial Infarction -unspecified I21.9  History:         Patient has no prior history of Echocardiogram examinations.                  CHF, Coronary stent 02/19/2022; Arrythmias:Atrial Fibrillation.                  No cardiac history listed in chart.  Sonographer:     Cristela BlueJerry Hege Sonographer#2:   Ceasar MonsJennifer Broe Referring Phys:  [id number] CHRISTOPHER END Diagnosing Phys: Yvonne Kendallhristopher End MD  Sonographer Comments: Image quality was fair. IMPRESSIONS  1. Left ventricular ejection fraction, by estimation, is 25 to 30%. The left ventricle has severely decreased function. The left ventricle demonstrates regional wall motion abnormalities (see scoring diagram/findings for description). Left ventricular diastolic parameters are consistent with Grade I diastolic dysfunction (impaired relaxation). There is severe hypokinesis of the left ventricular, entire anteroseptal wall, anterior wall, anterolateral wall and apical segment.  2. There is hypokinesis of the right ventricular apex with otherwise preserved RV contraction. The right ventricular size is normal. There is mildly elevated pulmonary artery systolic pressure.  3. The mitral valve is degenerative. Mild mitral valve regurgitation. No evidence of mitral stenosis. Moderate mitral annular calcification.  4. Tricuspid valve regurgitation is mild to moderate.  5. The aortic valve is tricuspid. Aortic valve regurgitation is mild. No aortic stenosis is present.  6. The inferior vena cava is  normal in size with <50% respiratory variability, suggesting right atrial pressure of 8 mmHg. FINDINGS  Left Ventricle: There appears to be a prominent trabecular or false tendon near the LV apex; no definite thrombus is identified. Left ventricular ejection fraction, by estimation, is 25 to 30%. The left ventricle has severely decreased function. The left ventricle demonstrates regional wall motion abnormalities. Severe hypokinesis of the left ventricular, entire anteroseptal wall, anterior wall, anterolateral wall and apical segment. Definity contrast agent was given IV to delineate the left ventricular endocardial borders. The left ventricular internal cavity size was normal in size. There is no left ventricular hypertrophy. Left ventricular diastolic parameters are consistent with Grade I diastolic dysfunction (impaired relaxation). Right Ventricle: There is hypokinesis of the right ventricular apex with otherwise preserved RV contraction. The right ventricular size is normal. No increase in right ventricular wall thickness. There is mildly elevated pulmonary artery systolic pressure. The tricuspid regurgitant velocity is 2.87 m/s, and with an assumed right atrial pressure of 8 mmHg, the estimated right ventricular systolic  pressure is 40.9 mmHg. Left Atrium: Left atrial size was normal in size. Right Atrium: Right atrial size was normal in size. Pericardium: There is no evidence of pericardial effusion. Presence of epicardial fat layer. Mitral Valve: The mitral valve is degenerative in appearance. There is mild thickening of the mitral valve leaflet(s). Moderate mitral annular calcification. Mild mitral valve regurgitation. No evidence of mitral valve stenosis. Tricuspid Valve: The tricuspid valve is not well visualized. Tricuspid valve regurgitation is mild to moderate. Aortic Valve: The aortic valve is tricuspid. Aortic valve regurgitation is mild. No aortic stenosis is present. Aortic valve mean gradient  measures 2.0 mmHg. Aortic valve peak gradient measures 3.0 mmHg. Aortic valve area, by VTI measures 2.37 cm. Pulmonic Valve: The pulmonic valve was not well visualized. Pulmonic valve regurgitation is trivial. No evidence of pulmonic stenosis. Aorta: The aortic root is normal in size and structure. Venous: The inferior vena cava is normal in size with less than 50% respiratory variability, suggesting right atrial pressure of 8 mmHg. IAS/Shunts: No atrial level shunt detected by color flow Doppler.  LEFT VENTRICLE PLAX 2D LVIDd:         4.20 cm     Diastology LVIDs:         3.80 cm     LV e' medial:    5.22 cm/s LV PW:         0.90 cm     LV E/e' medial:  10.1 LV IVS:        0.80 cm     LV e' lateral:   4.57 cm/s LVOT diam:     2.00 cm     LV E/e' lateral: 11.6 LV SV:         37 LV SV Index:   23 LVOT Area:     3.14 cm  LV Volumes (MOD) LV vol d, MOD A2C: 59.5 ml LV vol d, MOD A4C: 61.9 ml LV vol s, MOD A2C: 49.1 ml LV vol s, MOD A4C: 40.1 ml LV SV MOD A2C:     10.4 ml LV SV MOD A4C:     61.9 ml LV SV MOD BP:      18.6 ml RIGHT VENTRICLE RV Basal diam:  2.60 cm RV Mid diam:    1.70 cm RV S prime:     14.70 cm/s TAPSE (M-mode): 2.5 cm LEFT ATRIUM             Index        RIGHT ATRIUM          Index LA diam:        2.70 cm 1.65 cm/m   RA Area:     6.82 cm LA Vol (A2C):   31.8 ml 19.37 ml/m  RA Volume:   11.80 ml 7.19 ml/m LA Vol (A4C):   28.4 ml 17.30 ml/m LA Biplane Vol: 31.5 ml 19.19 ml/m  AORTIC VALVE AV Area (Vmax):    2.58 cm AV Area (Vmean):   2.33 cm AV Area (VTI):     2.37 cm AV Vmax:           86.50 cm/s AV Vmean:          63.200 cm/s AV VTI:            0.158 m AV Peak Grad:      3.0 mmHg AV Mean Grad:      2.0 mmHg LVOT Vmax:         71.10 cm/s LVOT Vmean:  46.900 cm/s LVOT VTI:          0.119 m LVOT/AV VTI ratio: 0.75  AORTA Ao Root diam: 3.10 cm MITRAL VALVE               TRICUSPID VALVE MV Area (PHT): 6.02 cm    TR Peak grad:   32.9 mmHg MV Decel Time: 126 msec    TR Vmax:        287.00  cm/s MV E velocity: 52.90 cm/s MV A velocity: 86.50 cm/s  SHUNTS MV E/A ratio:  0.61        Systemic VTI:  0.12 m                            Systemic Diam: 2.00 cm Yvonne Kendall MD Electronically signed by Yvonne Kendall MD Signature Date/Time: March 22, 2022/3:38:58 PM    Final (Updated)    DG Chest Portable 1 View  Result Date: 22-Mar-2022 CLINICAL DATA:  Chest pain.  Post cardiac catheterization. EXAM: PORTABLE CHEST 1 VIEW COMPARISON:  None Available. FINDINGS: Borderline enlarged cardiac silhouette and mediastinal contours. There is thickening of the right paratracheal stripe, presumably secondary to prominent vasculature. There is mild rightward deviation of the tracheal air column at the level of the aortic arch. Pulmonary vasculature is indistinct with cephalization of flow. Perihilar opacities favored to represent atelectasis. No definite pleural effusion or pneumothorax. Degenerative change the bilateral glenohumeral joints is suspected though incompletely evaluated. IMPRESSION: Cardiomegaly with findings suggestive of pulmonary edema and perihilar atelectasis. Electronically Signed   By: Simonne Come M.D.   On: 2022-03-22 12:10   CARDIAC CATHETERIZATION  Result Date: 2022-03-22 Conclusions: Severe single-vessel coronary artery disease with thrombotic occlusion of the ostial/proximal LAD followed by sequential 30% mid and 70% mid/distal LAD stenoses.  30% ostial stenosis is also observed in OM1 branch of dominant LCx. Severely reduced LVEF with anterior hypokinesis/akinesis.  LVEF 25-30% with moderately elevated filling pressure (LVEDP 32 mmHg). Successful PCI to ostial/proximal LAD using Onyx Frontier 2.5 x 15 mm drug-eluting stent (postdilated to 2.8 mm) with 0% residual stenosis and TIMI-2 flow in the distal vessel, likely related to microvascular dysfunction in the setting of late presenting STEMI. Recommendations: Admit to ICU for STEMI/acute HFrEF management status post primary PCI. Continue  cangrelor infusion for 2 hours from time of ticagrelor administration. Restart IV heparin 8 hours after right femoral artery hemostasis was achieved given atrial fibrillation with rapid ventricular response and emergent cardioversion today. Follow-up echocardiogram. Continue diuresis and initiation of goal-directed medical therapy for acute HFrEF due to ischemic cardiomyopathy as blood pressure and renal function allow. New dual antiplatelet therapy with aspirin and ticagrelor for now.  Based on results of echocardiogram (attention to LV thrombus) I would favor at least 12 months of warfarin versus DOAC plus clopidgrel at the time of discharge. Yvonne Kendall, MD [facility]  CT Head Wo Contrast  Result Date: 22-Mar-2022 CLINICAL DATA:  Head trauma, minor (Age >= 65y); Neck trauma (Age >= 65y) EXAM: CT HEAD WITHOUT CONTRAST CT CERVICAL SPINE WITHOUT CONTRAST TECHNIQUE: Multidetector CT imaging of the head and cervical spine was performed following the standard protocol without intravenous contrast. Multiplanar CT image reconstructions of the cervical spine were also generated. RADIATION DOSE REDUCTION: This exam was performed according to the departmental dose-optimization program which includes automated exposure control, adjustment of the mA and/or kV according to patient size and/or use of iterative reconstruction technique. COMPARISON:  Head CT 05/08/2021. FINDINGS:  CT HEAD FINDINGS Brain: No evidence of acute intracranial hemorrhage or extra-axial collection.No evidence of mass lesion/concerning mass effect.The ventricles are normal in size.Scattered subcortical and periventricular white matter hypodensities, nonspecific but likely sequela of chronic small vessel ischemic disease.Mild cerebral atrophy Vascular: No hyperdense vessel or unexpected calcification. Skull: Normal. Negative for fracture or focal lesion. Sinuses/Orbits: No acute finding. Other: None. CT CERVICAL SPINE FINDINGS Alignment:  Degenerative grade 1 anterolisthesis at C4-C5 and C6-C7 and C7-T1. Skull base and vertebrae: No acute fracture. No primary bone lesion or focal pathologic process. Soft tissues and spinal canal: No prevertebral fluid or swelling. No visible canal hematoma. Disc levels: There is mild to moderate multilevel degenerative disc disease worst at C5-C6. There is moderate-severe multilevel facet arthropathy bilaterally. C1-C2 degenerative changes. Upper chest: There is interlobular septal thickening and ground-glass opacities in the lung apices suggesting pulmonary edema. Other: None. IMPRESSION: No acute intracranial abnormality. Stable mild sequela of chronic small vessel ischemic disease. No acute cervical spine fracture. Multilevel degenerative disc disease and facet arthropathy. These results were called by telephone at the time of interpretation on March 02, 2022 at 10:13 am to provider Dr. Saunders Revel, who verbally acknowledged these results. Electronically Signed   By: Maurine Simmering M.D.   On: 02-Mar-2022 10:22   CT Cervical Spine Wo Contrast  Result Date: March 02, 2022 CLINICAL DATA:  Head trauma, minor (Age >= 65y); Neck trauma (Age >= 65y) EXAM: CT HEAD WITHOUT CONTRAST CT CERVICAL SPINE WITHOUT CONTRAST TECHNIQUE: Multidetector CT imaging of the head and cervical spine was performed following the standard protocol without intravenous contrast. Multiplanar CT image reconstructions of the cervical spine were also generated. RADIATION DOSE REDUCTION: This exam was performed according to the departmental dose-optimization program which includes automated exposure control, adjustment of the mA and/or kV according to patient size and/or use of iterative reconstruction technique. COMPARISON:  Head CT 05/08/2021. FINDINGS: CT HEAD FINDINGS Brain: No evidence of acute intracranial hemorrhage or extra-axial collection.No evidence of mass lesion/concerning mass effect.The ventricles are normal in size.Scattered subcortical and  periventricular white matter hypodensities, nonspecific but likely sequela of chronic small vessel ischemic disease.Mild cerebral atrophy Vascular: No hyperdense vessel or unexpected calcification. Skull: Normal. Negative for fracture or focal lesion. Sinuses/Orbits: No acute finding. Other: None. CT CERVICAL SPINE FINDINGS Alignment: Degenerative grade 1 anterolisthesis at C4-C5 and C6-C7 and C7-T1. Skull base and vertebrae: No acute fracture. No primary bone lesion or focal pathologic process. Soft tissues and spinal canal: No prevertebral fluid or swelling. No visible canal hematoma. Disc levels: There is mild to moderate multilevel degenerative disc disease worst at C5-C6. There is moderate-severe multilevel facet arthropathy bilaterally. C1-C2 degenerative changes. Upper chest: There is interlobular septal thickening and ground-glass opacities in the lung apices suggesting pulmonary edema. Other: None. IMPRESSION: No acute intracranial abnormality. Stable mild sequela of chronic small vessel ischemic disease. No acute cervical spine fracture. Multilevel degenerative disc disease and facet arthropathy. These results were called by telephone at the time of interpretation on March 02, 2022 at 10:13 am to provider Dr. Saunders Revel, who verbally acknowledged these results. Electronically Signed   By: Maurine Simmering M.D.   On: 2022/03/02 10:22    Microbiology: Results for orders placed or performed during the hospital encounter of 2022/03/02  MRSA Next Gen by PCR, Nasal     Status: None   Collection Time: 02-Mar-2022 11:48 AM   Specimen: Nasal Mucosa; Nasal Swab  Result Value Ref Range Status   MRSA by PCR Next Gen NOT DETECTED NOT DETECTED Final  Comment: (NOTE) The GeneXpert MRSA Assay (FDA approved for NASAL specimens only), is one component of a comprehensive MRSA colonization surveillance program. It is not intended to diagnose MRSA infection nor to guide or monitor treatment for MRSA infections. Test performance is  not FDA approved in patients less than 13 years old. Performed at [facility], [address] [place], [place] [address]     Labs: CBC: Recent Labs  Lab 03/01/22 0550  WBC 14.4*  HGB 12.9  HCT 39.1  MCV 93.8  PLT 233   Basic Metabolic Panel: Recent Labs  Lab 03/01/22 1036  NA 135  K 3.3*  CL 104  CO2 20*  GLUCOSE 205*  BUN 54*  CREATININE 1.32*  CALCIUM 7.8*   Liver Function Tests: No results for input(s): "AST", "ALT", "ALKPHOS", "BILITOT", "PROT", "ALBUMIN" in the last 168 hours. CBG: No results for input(s): "GLUCAP" in the last 168 hours.  Discharge time spent:  30 minutes.  Signed: Baldomero Lamy, MD [facility] 03/07/2022

## 2022-04-03 NOTE — Progress Notes (Signed)
       CROSS COVER NOTE  NAME: Carnella Fryman MRN: [id number] DOB : 01-14-1929 ATTENDING PHYSICIAN: Shawna Clamp, MD    Notified by RN that patient has expired at [place]  Patient was DNR and comfort care  2 RN verified.   This document was prepared using Dragon voice recognition software and may include unintentional dictation errors.  Neomia Glass DNP, MBA, FNP-BC Nurse Practitioner [facility] Pager [phone]

## 2022-04-03 DEATH — deceased

## 2022-05-22 IMAGING — US US CAROTID DUPLEX BILAT
1 series · 13 of 24 positions shown · non-contrast
Comparison: None.

CLINICAL DATA: Dizziness and headache

EXAM:
BILATERAL CAROTID DUPLEX ULTRASOUND
TECHNIQUE: Gray scale imaging, color Doppler and duplex ultrasound were
performed of bilateral carotid and vertebral arteries in the neck.

[Series 1: us carotid duplex bilat · 0.06mm/px · 13 of 63 slices shown]
[im 1/63]
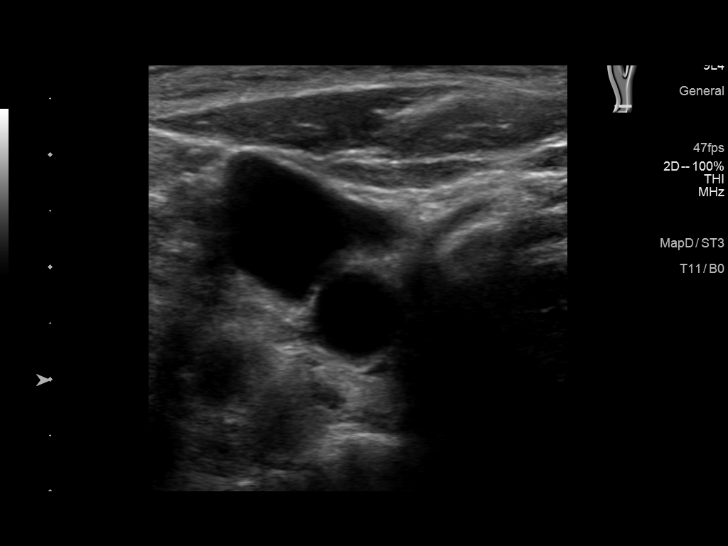
[im 6/63]
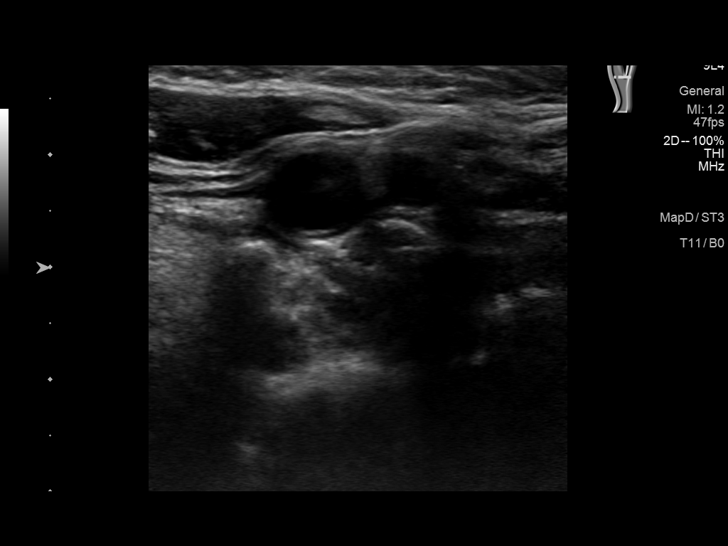
[im 11/63]
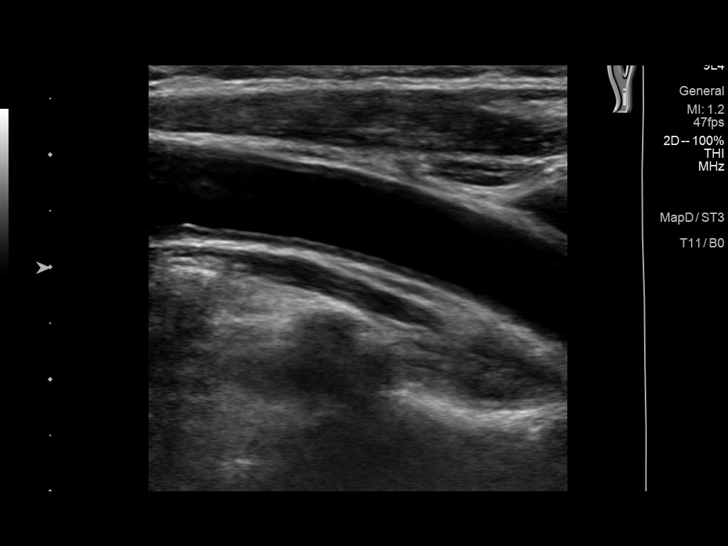
[im 17/63]
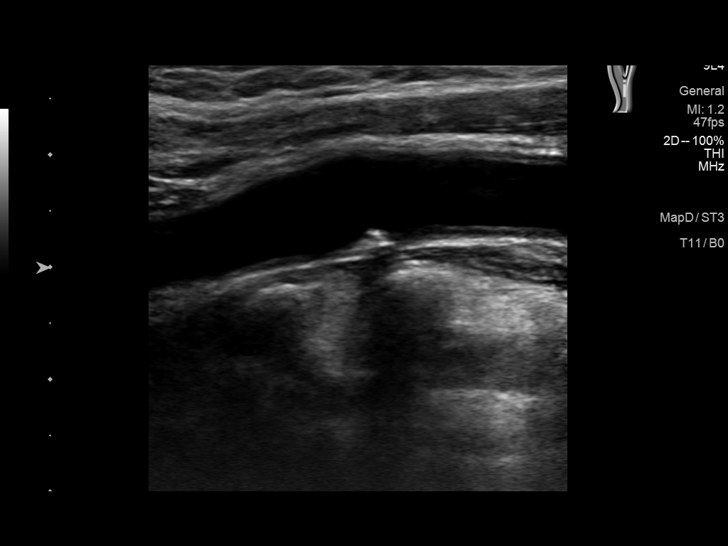
[im 22/63]
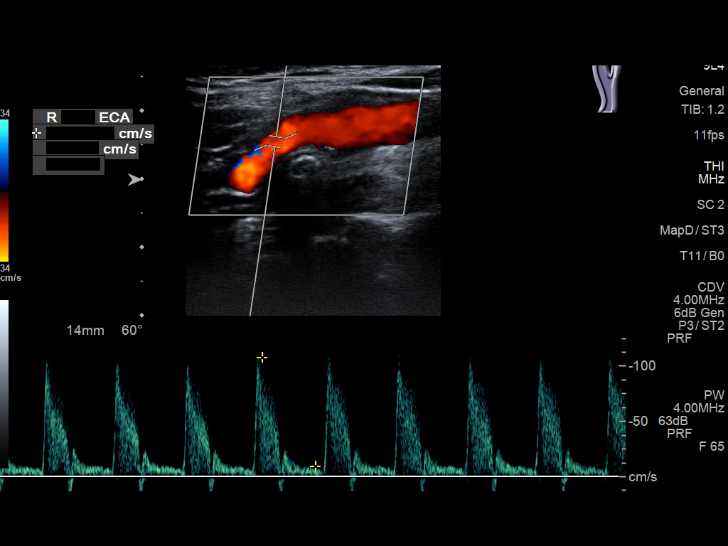
[im 27/63]
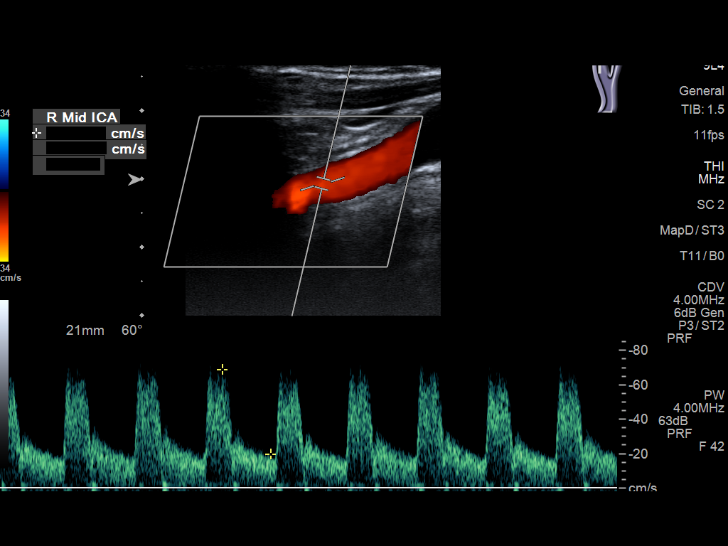
[im 33/63]
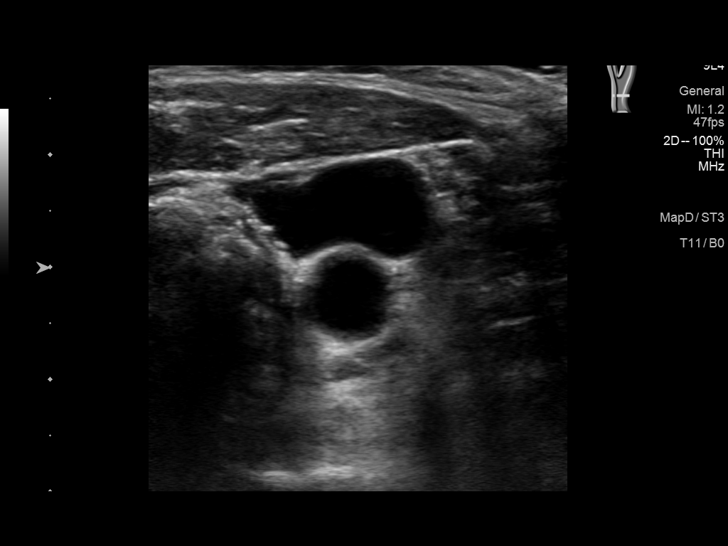
[im 36/63]
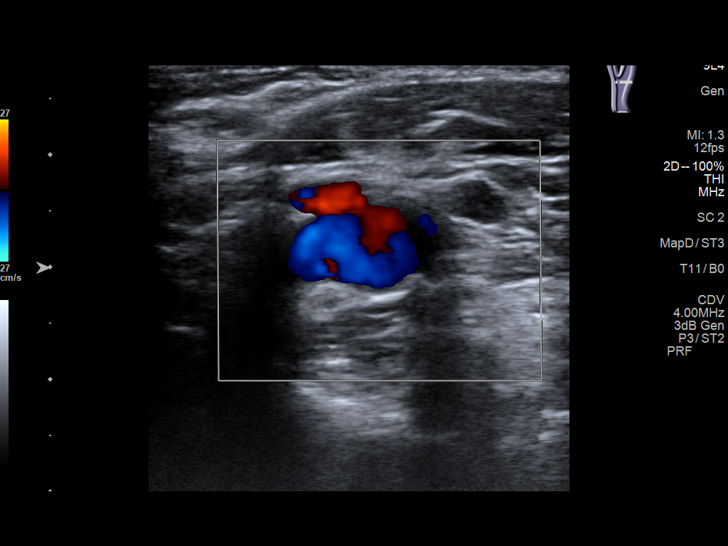
[im 41/63]
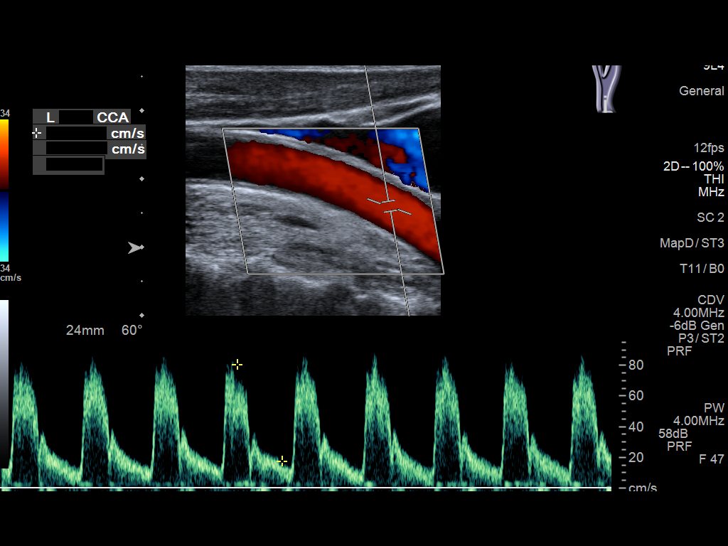
[im 46/63]
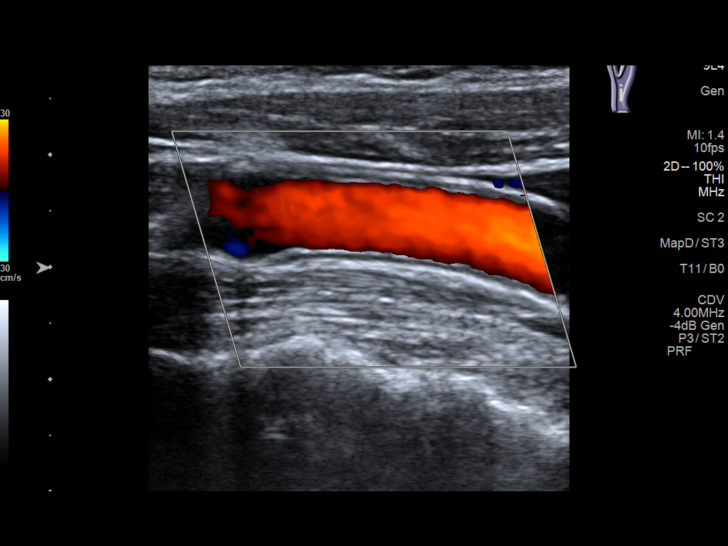
[im 52/63]
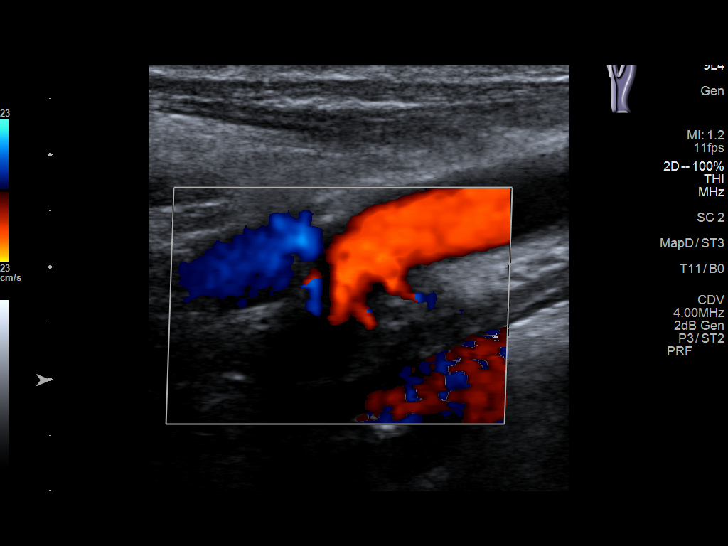
[im 57/63]
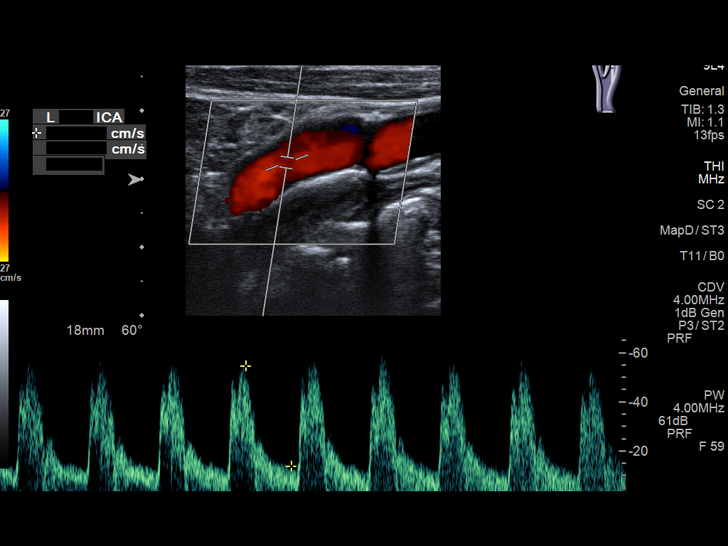
[im 63/63]
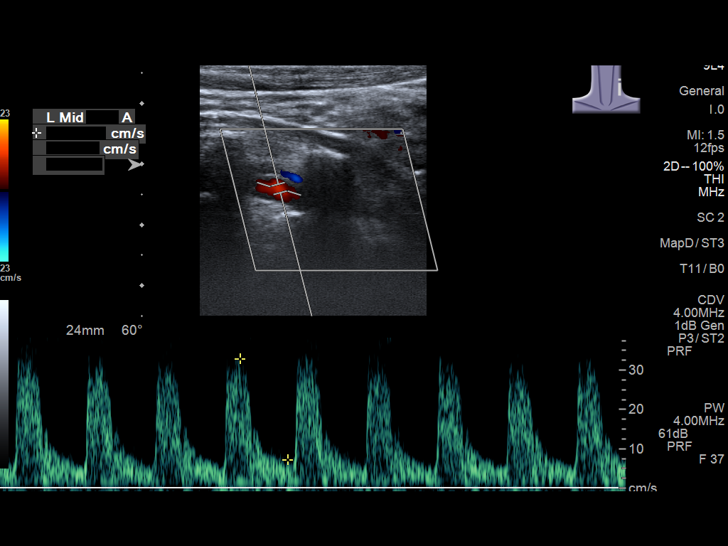

[13 of 24 positions shown; findings below may reference images not displayed]

FINDINGS: Criteria: Quantification of carotid stenosis is based on velocity
parameters that correlate the residual internal carotid diameter
with NASCET-based stenosis levels, using the diameter of the distal
internal carotid lumen as the denominator for stenosis measurement.

The following velocity measurements were obtained:

RIGHT

ICA: 108/29 cm/sec

CCA: 76/15 cm/sec

SYSTOLIC ICA/CCA RATIO:

ECA: 108 cm/sec

LEFT

ICA: 106/32 cm/sec

CCA: 70/18 cm/sec

SYSTOLIC ICA/CCA RATIO:

ECA: 94 cm/sec

RIGHT CAROTID ARTERY: Minor echogenic shadowing plaque formation. No
hemodynamically significant right ICA stenosis, velocity elevation,
or turbulent flow. Degree of narrowing less than 50%.

RIGHT VERTEBRAL ARTERY:  Normal antegrade flow

LEFT CAROTID ARTERY: Similar scattered minor echogenic plaque
formation. No hemodynamically significant left ICA stenosis,
velocity elevation, or turbulent flow.

LEFT VERTEBRAL ARTERY:  Normal antegrade flow
IMPRESSION: Very minor carotid atherosclerosis. Negative for stenosis. Degree of
narrowing less than 50% bilaterally by ultrasound criteria.

Patent antegrade vertebral flow bilaterally

## 2022-05-22 IMAGING — CT CT HEAD W/O CM
1 series · 16 of 29 positions shown, 20 images · non-contrast
Comparison: 10/11/2016

CLINICAL DATA: Dizziness for several weeks

EXAM:
CT HEAD WITHOUT CONTRAST
TECHNIQUE: Contiguous axial images were obtained from the base of the skull
through the vertex without intravenous contrast.

[Series 2: head wo · axial · 0.41mm/px · z∈[-137,-7]mm · 16 of 29 slices shown, 20 images]
[im 2/29  brain]
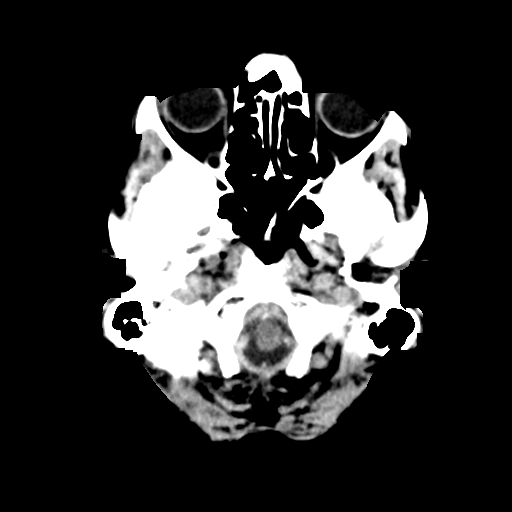
[im 2/29  bone]
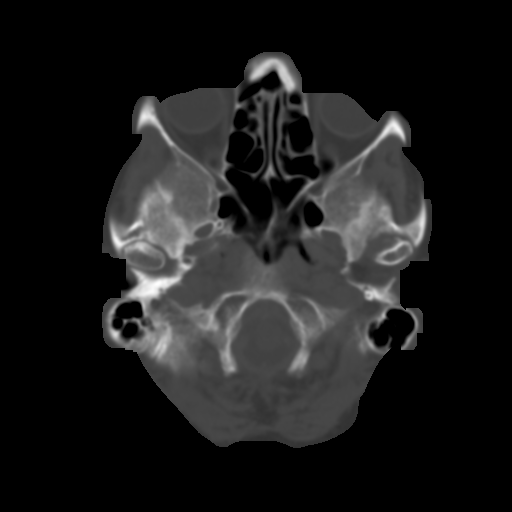
[im 4/29  brain]
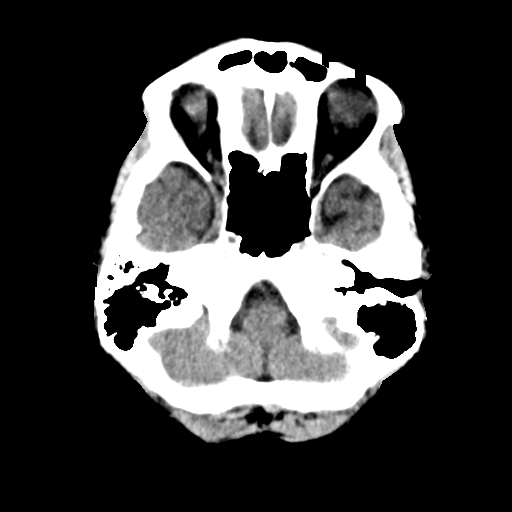
[im 6/29  brain]
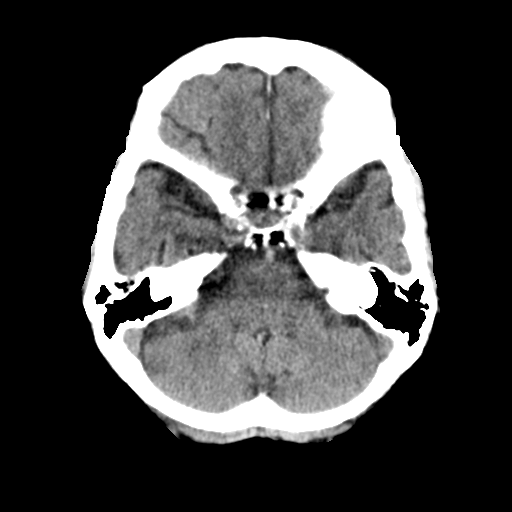
[im 7/29  brain]
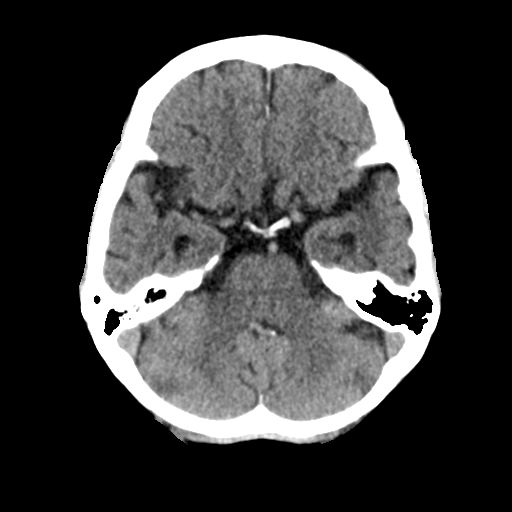
[im 9/29  brain]
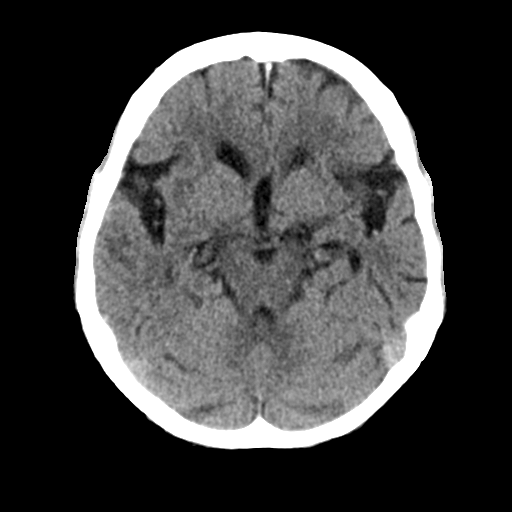
[im 9/29  bone]
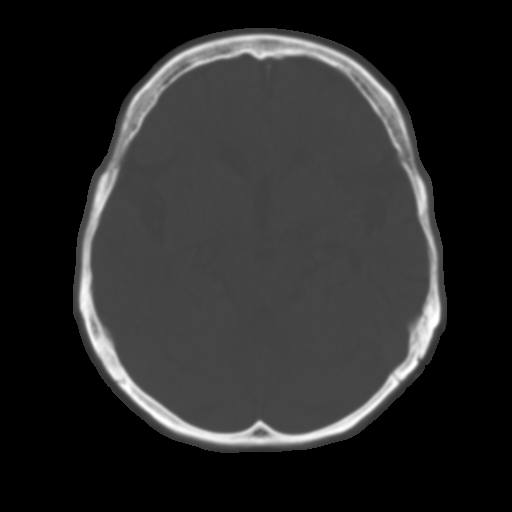
[im 11/29  brain]
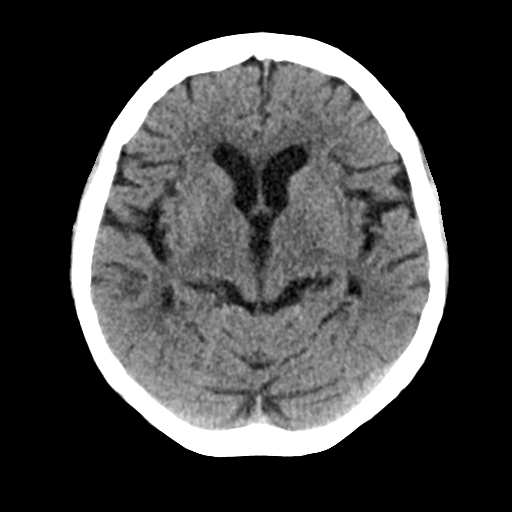
[im 12/29  brain]
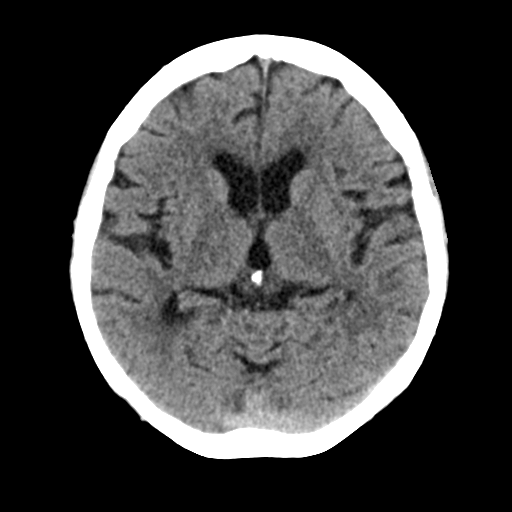
[im 14/29  brain]
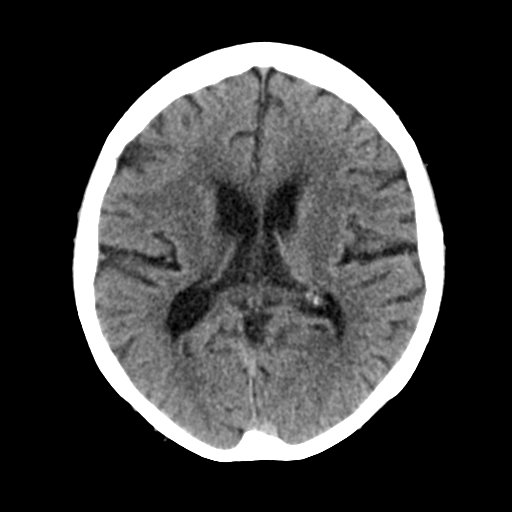
[im 16/29  brain]
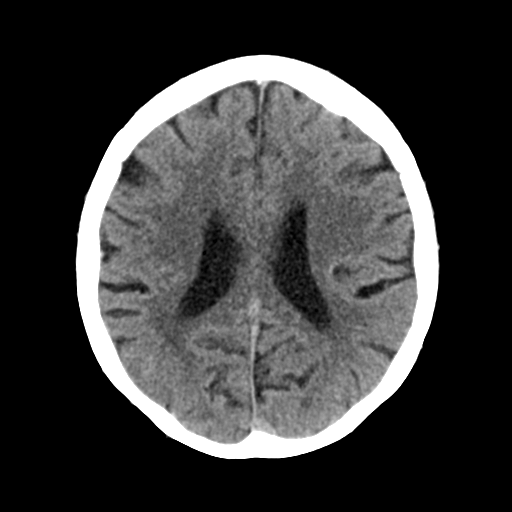
[im 16/29  bone]
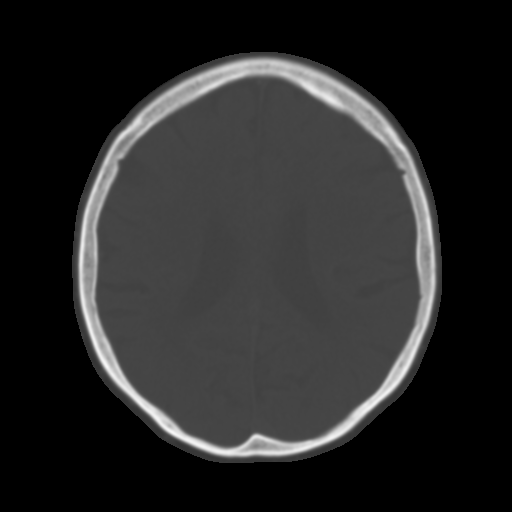
[im 18/29  brain]
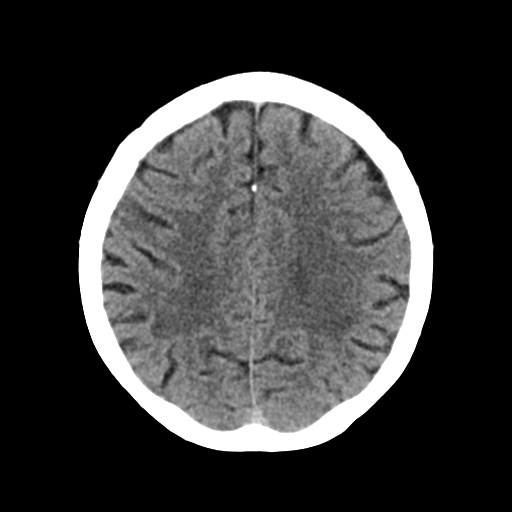
[im 19/29  brain]
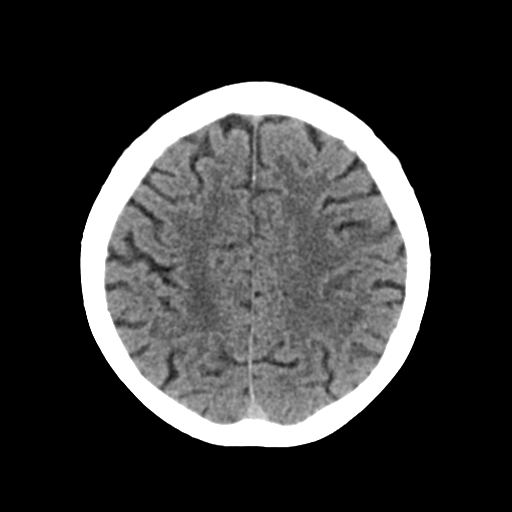
[im 21/29  brain]
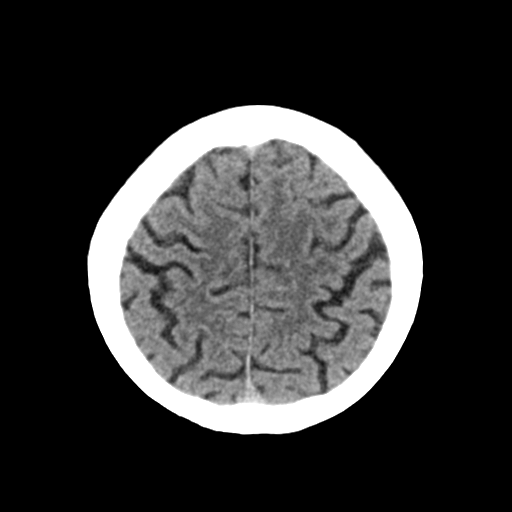
[im 23/29  brain]
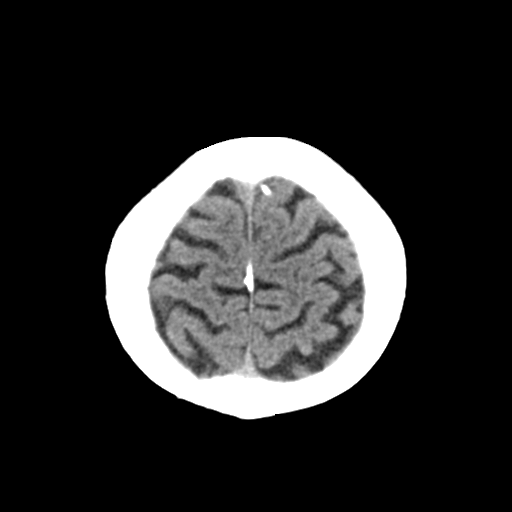
[im 23/29  bone]
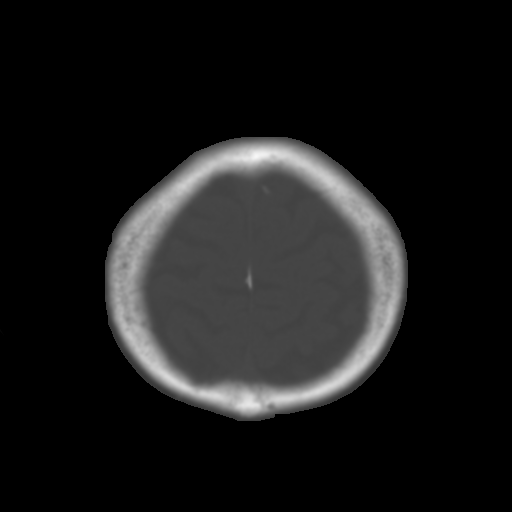
[im 24/29  brain]
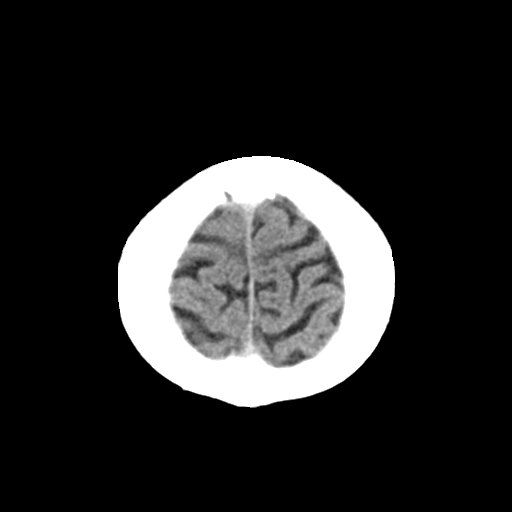
[im 26/29  brain]
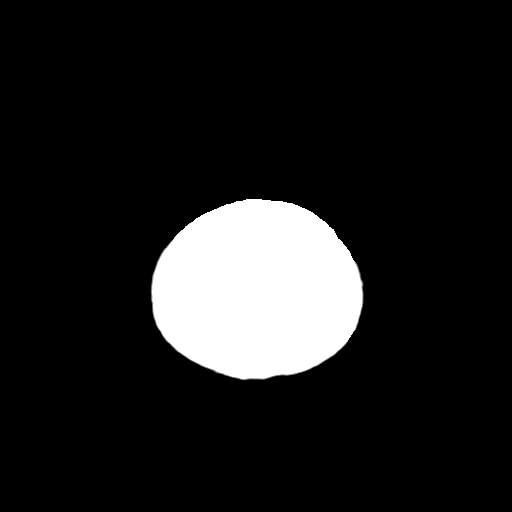
[im 28/29  brain]
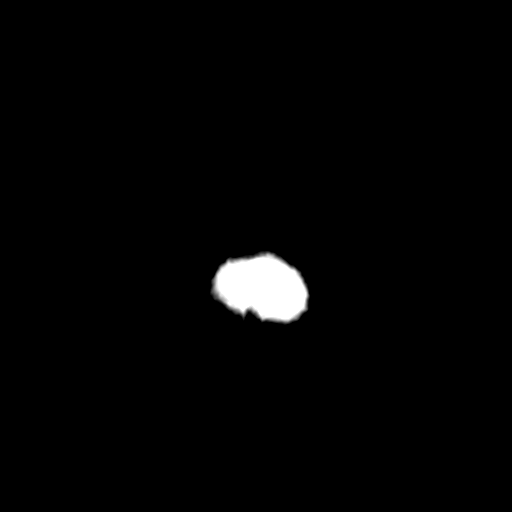

[16 of 29 positions shown; findings below may reference images not displayed]

FINDINGS: Brain: Minor age-related atrophy and white matter microvascular
ischemic changes throughout both cerebral hemispheres. No acute
intracranial hemorrhage, mass lesion, new infarction, midline shift,
herniation, hydrocephalus, or extra-axial fluid collection. No focal
mass effect or edema. Cisterns are patent. No cerebellar
abnormality.

Vascular: No hyperdense vessel or unexpected calcification.

Skull: Normal. Negative for fracture or focal lesion.

Sinuses/Orbits: No acute finding.

Other: None.
IMPRESSION: Stable mild atrophy and white matter microvascular ischemic changes.

No acute intracranial abnormality by noncontrast CT.
